# Patient Record
Sex: Female | Born: 1970 | Race: White | Hispanic: No | Marital: Single | Smoking: Former smoker
Health system: Southern US, Community
[De-identification: ages and names within clinical notes are randomized; demographics above are authoritative.]

## PROBLEM LIST (undated history)

## (undated) DIAGNOSIS — F329 Major depressive disorder, single episode, unspecified: Secondary | ICD-10-CM

## (undated) DIAGNOSIS — L309 Dermatitis, unspecified: Secondary | ICD-10-CM

## (undated) DIAGNOSIS — G43909 Migraine, unspecified, not intractable, without status migrainosus: Secondary | ICD-10-CM

## (undated) DIAGNOSIS — E669 Obesity, unspecified: Secondary | ICD-10-CM

## (undated) DIAGNOSIS — K219 Gastro-esophageal reflux disease without esophagitis: Secondary | ICD-10-CM

## (undated) DIAGNOSIS — Z923 Personal history of irradiation: Secondary | ICD-10-CM

## (undated) DIAGNOSIS — D0591 Unspecified type of carcinoma in situ of right breast: Secondary | ICD-10-CM

## (undated) DIAGNOSIS — F32A Depression, unspecified: Secondary | ICD-10-CM

## (undated) DIAGNOSIS — F4310 Post-traumatic stress disorder, unspecified: Secondary | ICD-10-CM

## (undated) DIAGNOSIS — K449 Diaphragmatic hernia without obstruction or gangrene: Secondary | ICD-10-CM

## (undated) DIAGNOSIS — K760 Fatty (change of) liver, not elsewhere classified: Secondary | ICD-10-CM

## (undated) DIAGNOSIS — R52 Pain, unspecified: Secondary | ICD-10-CM

## (undated) DIAGNOSIS — G4730 Sleep apnea, unspecified: Secondary | ICD-10-CM

## (undated) DIAGNOSIS — M549 Dorsalgia, unspecified: Secondary | ICD-10-CM

## (undated) DIAGNOSIS — M2550 Pain in unspecified joint: Secondary | ICD-10-CM

## (undated) DIAGNOSIS — F419 Anxiety disorder, unspecified: Secondary | ICD-10-CM

## (undated) DIAGNOSIS — E282 Polycystic ovarian syndrome: Secondary | ICD-10-CM

## (undated) DIAGNOSIS — R1011 Right upper quadrant pain: Secondary | ICD-10-CM

## (undated) DIAGNOSIS — J45909 Unspecified asthma, uncomplicated: Secondary | ICD-10-CM

## (undated) HISTORY — DX: Polycystic ovarian syndrome: E28.2

## (undated) HISTORY — PX: OTHER SURGICAL HISTORY: SHX169

## (undated) HISTORY — DX: Right upper quadrant pain: R10.11

## (undated) HISTORY — DX: Depression, unspecified: F32.A

## (undated) HISTORY — DX: Dorsalgia, unspecified: M54.9

## (undated) HISTORY — DX: Pain in unspecified joint: M25.50

## (undated) HISTORY — DX: Obesity, unspecified: E66.9

## (undated) HISTORY — DX: Unspecified asthma, uncomplicated: J45.909

## (undated) HISTORY — DX: Anxiety disorder, unspecified: F41.9

## (undated) HISTORY — DX: Post-traumatic stress disorder, unspecified: F43.10

## (undated) HISTORY — DX: Pain, unspecified: R52

## (undated) HISTORY — DX: Major depressive disorder, single episode, unspecified: F32.9

## (undated) HISTORY — DX: Gastro-esophageal reflux disease without esophagitis: K21.9

## (undated) HISTORY — DX: Fatty (change of) liver, not elsewhere classified: K76.0

## (undated) HISTORY — DX: Unspecified type of carcinoma in situ of right breast: D05.91

## (undated) HISTORY — DX: Dermatitis, unspecified: L30.9

## (undated) HISTORY — DX: Migraine, unspecified, not intractable, without status migrainosus: G43.909

---
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
## 1994-09-03 HISTORY — PX: TONSILLECTOMY: SUR1361

## 2011-11-09 ENCOUNTER — Encounter: Payer: Self-pay | Admitting: Internal Medicine

## 2011-11-09 ENCOUNTER — Ambulatory Visit (INDEPENDENT_AMBULATORY_CARE_PROVIDER_SITE_OTHER): Admitting: Internal Medicine

## 2011-11-09 VITALS — BP 106/74 | HR 80 | Temp 98.0°F | Ht 63.0 in | Wt 239.0 lb

## 2011-11-09 DIAGNOSIS — R05 Cough: Secondary | ICD-10-CM

## 2011-11-09 MED ORDER — ESOMEPRAZOLE MAGNESIUM 40 MG PO CPDR: 40.0000 mg | DELAYED_RELEASE_CAPSULE | Freq: Every day | ORAL | Status: DC

## 2011-11-09 MED ORDER — TRAMADOL HCL 50 MG PO TABS: ORAL_TABLET | ORAL | Status: AC

## 2011-11-09 MED ORDER — PREDNISONE (PAK) 10 MG PO TABS: ORAL_TABLET | ORAL | Status: AC

## 2011-11-09 NOTE — Patient Instructions (Addendum)
The key to effective treatment for your cough is eliminating the non-stop cycle of cough you're stuck in long enough to let your airway heal completely and then see if there is anything still making you cough once you stop the cough suppression, but this should take no more than 5 days to figuure out  First take delsym two tsp every 12 hours and supplement if needed with  tramadol 50 mg up to 2 every 4 hours to suppress the urge to cough. Swallowing water or using ice chips/non mint and menthol containing candies (such as lifesavers or sugarless jolly ranchers) are also effective.  You should rest your voice and avoid activities that you know make you cough.  Once you have eliminated the cough for 3 straight days try reducing the tramadol first,  then the delsym as tolerated.    Try prilosec 20mg  Take 30-60 min before first meal of the day and Pepcid 20 mg one bedtime until cough is completely gone for at least a week without the need for cough suppression  I think of reflux for chronic cough like I do oxygen for fire (doesn't cause the fire but once you get the oxygen suppressed it usually goes away regardless of the exact cause).  GERD (REFLUX)  is an extremely common cause of respiratory symptoms, many times with no significant heartburn at all.    It can be treated with medication, but also with lifestyle changes including avoidance of late meals, excessive alcohol, smoking cessation, and avoid fatty foods, chocolate, peppermint, colas, red wine, and acidic juices such as orange juice.  NO MINT OR MENTHOL PRODUCTS SO NO COUGH DROPS  USE SUGARLESS CANDY INSTEAD (jolley ranchers or Stover's)  NO OIL BASED VITAMINS - use powdered substitutes.    Prednisone 10 mg take  4 each am x 2 days,   2 each am x 2 days,  1 each am x2days and stop   Please schedule a follow up office visit in 2 weeks, sooner if needed   

## 2011-11-09 NOTE — Progress Notes (Signed)
  Subjective:    Patient ID: Erica Higgins, female    DOB: 1971-06-19  MRN: [id number]  HPI  52 yowf  never regular smoker  with lifelong sinus problems in spring / summer with no lower resp issues until moved back to [facility] into old house in August 2012 with onset Nov 2012 of cough and therefore referred 11/09/2011 by Dr Christell Constant to pulmonary clinic  11/09/2011 1st pulmonary eval/ Harman Langhans cc daily cough since insolent onset assoc with minimal mucus more day than night and some better p prednisone and some better p symbicort  2 bid and albuterol qid and used ppi some. Baby powder seems to make it worse. No overt hb or sinus complaints, and no assoc chest tighness wheeze or sob  Sleeping ok without nocturnal  or early am exacerbation  of respiratory  c/o's or need for noct saba. Also denies any obvious fluctuation of symptoms with weather or environmental changes or other aggravating or alleviating factors except as outlined above    Review of Systems  Constitutional: Positive for chills. Negative for fever and unexpected weight change.  HENT: Positive for dental problem. Negative for ear pain, nosebleeds, congestion, sore throat, rhinorrhea, sneezing, trouble swallowing, voice change, postnasal drip and sinus pressure.   Eyes: Negative for visual disturbance.  Respiratory: Positive for cough. Negative for choking and shortness of breath.   Cardiovascular: Positive for leg swelling. Negative for chest pain.  Gastrointestinal: Negative for vomiting, abdominal pain and diarrhea.  Genitourinary: Negative for difficulty urinating.  Musculoskeletal: Positive for arthralgias.  Skin: Negative for rash.  Neurological: Negative for tremors, syncope and headaches.  Hematological: Does not bruise/bleed easily.       Objective:   Physical Exam  amb wf nad with freq throat clearing  Wt 239 11/09/2011   HEENT: nl dentition, turbinates, and orophanx. Nl external ear canals without cough reflex   NECK :  without  JVD/Nodes/TM/ nl carotid upstrokes bilaterally   LUNGS: no acc muscle use, clear to A and P bilaterally without cough on insp or exp maneuvers   CV:  RRR  no s3 or murmur or increase in P2, no edema   ABD:  soft and nontender with nl excursion in the supine position. No bruits or organomegaly, bowel sounds nl  MS:  warm without deformities, calf tenderness, cyanosis or clubbing  SKIN: warm and dry without lesions    NEURO:  alert, approp, no deficits    09/16/11 cxr report from Dr Kathi Der office Low lung vol, o/w ok    Assessment & Plan:

## 2011-11-18 DIAGNOSIS — R05 Cough: Secondary | ICD-10-CM | POA: Insufficient documentation

## 2011-11-18 DIAGNOSIS — R053 Chronic cough: Secondary | ICD-10-CM | POA: Insufficient documentation

## 2011-11-18 NOTE — Assessment & Plan Note (Signed)
The most common causes of chronic cough in immunocompetent adults include the following: upper airway cough syndrome (UACS), previously referred to as postnasal drip syndrome (PNDS), which is caused by variety of rhinosinus conditions; (2) asthma; (3) GERD; (4) chronic bronchitis from cigarette smoking or other inhaled environmental irritants; (5) nonasthmatic eosinophilic bronchitis; and (6) bronchiectasis.   These conditions, singly or in combination, have accounted for up to 94% of the causes of chronic cough in prospective studies.   Other conditions have constituted no >6% of the causes in prospective studies These have included bronchogenic carcinoma, chronic interstitial pneumonia, sarcoidosis, left ventricular failure, ACEI-induced cough, and aspiration from a condition associated with pharyngeal dysfunction.   .Chronic cough is often simultaneously caused by more than one condition. A single cause has been found from 38 to 82% of the time, multiple causes from 18 to 62%. Multiply caused cough has been the result of three diseases up to 42% of the time.    Of the three most common causes of chronic cough, only one (GERD)  can actually cause the other two (asthma and post nasal drip syndrome)  and perpetuate the cylce of cough inducing airway trauma, inflammation, heightened sensitivity to reflux which is prompted by the cough itself via a cyclical mechanism.    This may partially respond to steroids and look like asthma and post nasal drainage but never erradicated completely unless the cough and the secondary reflux are eliminated, preferably both at the same time.  While not intuitively obvious, many patients with chronic low grade reflux do not cough until there is a secondary insult that disturbs the protective epithelial barrier and exposes sensitive nerve endings.  This can be viral or direct physical injury such as with an endotracheal tube.   The point is that once this occurs, it is  difficult to eliminate using anything but a maximally effective acid suppression regimen at least in the short run, accompanied by an appropriate diet to address non acid GERD.   Rec eliminate cyclical coughing then regroup in 2 weeks

## 2011-11-26 ENCOUNTER — Encounter: Payer: Self-pay | Admitting: Internal Medicine

## 2011-11-26 ENCOUNTER — Ambulatory Visit (INDEPENDENT_AMBULATORY_CARE_PROVIDER_SITE_OTHER): Admitting: Internal Medicine

## 2011-11-26 VITALS — BP 110/72 | HR 82 | Temp 98.2°F | Ht 63.0 in | Wt 240.0 lb

## 2011-11-26 DIAGNOSIS — R05 Cough: Secondary | ICD-10-CM

## 2011-11-26 DIAGNOSIS — J309 Allergic rhinitis, unspecified: Secondary | ICD-10-CM

## 2011-11-26 MED ORDER — FAMOTIDINE 20 MG PO TABS: ORAL_TABLET | ORAL | Status: AC

## 2011-11-26 MED ORDER — RABEPRAZOLE SODIUM 20 MG PO TBEC: 20.0000 mg | DELAYED_RELEASE_TABLET | Freq: Every day | ORAL | Status: AC

## 2011-11-26 NOTE — Progress Notes (Signed)
  Subjective:    Patient ID: Erica Higgins, female    DOB: 01-Jun-1971  MRN: [id number]  HPI  36 yowf  never regular smoker  with lifelong sinus problems in spring / summer with no lower resp issues until moved back to [facility] into old house in August 2012 with onset Nov 2012 of cough and therefore referred 11/09/2011 by Dr Christell Constant to pulmonary clinic  11/09/2011 1st pulmonary eval/ Aymen Widrig cc daily cough since insolent onset assoc with minimal mucus more day than night and some better p prednisone and some better p symbicort  2 bid and albuterol qid and used ppi some. Baby powder seems to make it worse. No overt hb or sinus complaints, and no assoc chest tighness wheeze or sob rec Cyclical cough regimen with prednisone x 6 days  11/26/2011 f/u ov/Zigmond Trela cc cough much better but problems tolerating nexium due to sensation of bloating and nausea. No daytime saba need. No purulent sputum or overt sinus or reflux symptoms. Minimal itching/ sneezing watery eyes and mucoid nasal discharge previously controlled with allegra or clariton  Sleeping ok without nocturnal  or early am exacerbation  of respiratory  c/o's or need for noct saba. Also denies any obvious fluctuation of symptoms with weather or environmental changes or other aggravating or alleviating factors except as outlined above.   ROS  At present neg for  any significant sore throat, dysphagia, nasal congestion or excess/ purulent secretions,  fever, chills, sweats, unintended wt loss, pleuritic or exertional cp, hempoptysis, orthopnea pnd or leg swelling.  Also denies presyncope, palpitations, heartburn, abdominal pain, nausea, vomiting, diarrhea  or change in bowel or urinary habits, dysuria,hematuria,  rash, arthralgias, visual complaints, headache, numbness weakness or ataxia.           Objective:   Physical Exam  amb wf nad    Wt 239 11/09/2011 > 240 11/26/2011   HEENT: nl dentition, turbinates, and orophanx. Nl external ear canals without cough  reflex   NECK :  without JVD/Nodes/TM/ nl carotid upstrokes bilaterally   LUNGS: no acc muscle use, clear to A and P bilaterally without cough on insp or exp maneuvers   CV:  RRR  no s3 or murmur or increase in P2, no edema   ABD:  soft and nontender with nl excursion in the supine position. No bruits or organomegaly, bowel sounds nl  MS:  warm without deformities, calf tenderness, cyanosis or clubbing       09/16/11 cxr report from Dr Kathi Der office Low lung vol, o/w ok    Assessment & Plan:

## 2011-11-26 NOTE — Patient Instructions (Signed)
Ok to take Careers adviser / clariton as needed   Aciphex 20mg   Take 30-60 min before first meal of the day and Pepcid 20 mg at bedtime until next visit  Please schedule a follow up office visit in 6 weeks, call sooner if needed

## 2011-11-27 DIAGNOSIS — J309 Allergic rhinitis, unspecified: Secondary | ICD-10-CM | POA: Insufficient documentation

## 2011-11-27 NOTE — Assessment & Plan Note (Addendum)
This is almost certainly  Classic Upper airway cough syndrome, so named because it's frequently impossible to sort out how much is  CR/sinusitis with freq throat clearing (which can be related to primary GERD)   vs  causing  secondary (" extra esophageal")  GERD from wide swings in gastric pressure that occur with throat clearing, often  promoting self use of mint and menthol lozenges that reduce the lower esophageal sphincter tone and exacerbate the problem further in a cyclical fashion.   These are the same pts (now being labeled as having "irritable larynx syndrome" by some cough centers) who not infrequently have a history of having failed to tolerate ace inhibitors,  dry powder inhalers or biphosphonates or report having atypical reflux symptoms that don't respond to standard doses of PPI , and are easily confused as having aecopd or asthma flares by even experienced allergists/ pulmonologists.   Based on initial response, needs to maintain aggressive rx x 6 weeks to see if flares again on gerd rx, in which case consider adding qvar for cough variant asthma as another concern  NB Chronic cough is often simultaneously caused by more than one condition. A single cause has been found from 38 to 82% of the time, multiple causes from 18 to 62%. Multiply caused cough has been the result of three diseases up to 42% of the time.

## 2011-11-27 NOTE — Assessment & Plan Note (Signed)
Ok to use Careers adviser or clariton as needed with zyrtec otc a third over the counter alternative

## 2011-12-04 ENCOUNTER — Institutional Professional Consult (permissible substitution): Payer: Self-pay | Admitting: Internal Medicine

## 2012-01-08 ENCOUNTER — Ambulatory Visit (INDEPENDENT_AMBULATORY_CARE_PROVIDER_SITE_OTHER): Admitting: Internal Medicine

## 2012-01-08 ENCOUNTER — Encounter: Payer: Self-pay | Admitting: Internal Medicine

## 2012-01-08 VITALS — BP 118/80 | HR 69 | Temp 97.6°F | Ht 63.0 in | Wt 234.0 lb

## 2012-01-08 DIAGNOSIS — J309 Allergic rhinitis, unspecified: Secondary | ICD-10-CM

## 2012-01-08 DIAGNOSIS — R05 Cough: Secondary | ICD-10-CM

## 2012-01-08 NOTE — Assessment & Plan Note (Signed)
Response to ppi/h2 hs strongly supports dx of  Upper airway cough syndrome, so named because it's frequently impossible to sort out how much is  CR/sinusitis with freq throat clearing (which can be related to primary GERD)   vs  causing  secondary (" extra esophageal")  GERD from wide swings in gastric pressure that occur with throat clearing, often  promoting self use of mint and menthol lozenges that reduce the lower esophageal sphincter tone and exacerbate the problem further in a cyclical fashion.   These are the same pts (now being labeled as having "irritable larynx syndrome" by some cough centers) who not infrequently have a history of having failed to tolerate ace inhibitors,  dry powder inhalers or biphosphonates or report having atypical reflux symptoms that don't respond to standard doses of PPI , and are easily confused as having aecopd or asthma flares by even experienced allergists/ pulmonologists.   No changes needed for now, no need for maint rx for asthma as long as albuterol use so minimum.

## 2012-01-08 NOTE — Assessment & Plan Note (Signed)
Not responding to allegra or clarition so try zyrtec and NS irrigation with option to start Nasal steroids later if not responding.  Could also entertain trial of singulair if needed but at this point no further pulmonary f/u needed.

## 2012-01-08 NOTE — Progress Notes (Signed)
  Subjective:    Patient ID: Erica Higgins, female    DOB: 08/23/1971  MRN: [id number]  HPI  5 yowf  never regular smoker  with lifelong sinus problems in spring / summer with no lower resp issues until moved back to [facility] into old house in August 2012 with onset Nov 2012 of cough and therefore referred 11/09/2011 by Dr Christell Constant to pulmonary clinic  11/09/2011 1st pulmonary eval/ Aniceto Kyser cc daily cough since insolent onset assoc with minimal mucus more day than night and some better p prednisone and some better p symbicort  2 bid and albuterol qid and used ppi some. Baby powder seems to make it worse. No overt hb or sinus complaints, and no assoc chest tighness wheeze or sob rec Cyclical cough regimen with prednisone x 6 days  11/26/2011 f/u ov/Latrecia Capito cc cough much better but problems tolerating nexium due to sensation of bloating and nausea. No daytime saba need. No purulent sputum or overt sinus or reflux symptoms. Minimal itching/ sneezing watery eyes and mucoid nasal discharge previously controlled with allegra or clariton rec Ok to take allegra / clariton as needed  Aciphex 20mg   Take 30-60 min before first meal of the day and Pepcid 20 mg at bedtime until next visit   01/08/2012 f/u ov/Virat Prather cc 90% better, rare need for albuterol, clariton not helping as much with sense of throat  tickle / nasal congestion, but not affecting breathing, no purulent secretions. Not limited from desired activities  Sleeping ok without nocturnal  or early am exacerbation  of respiratory  c/o's or need for noct saba. Also denies any obvious fluctuation of symptoms with weather or environmental changes or other aggravating or alleviating factors except as outlined above.   ROS  At present neg for  any significant sore throat, dysphagia, nasal congestion or excess/ purulent secretions,  fever, chills, sweats, unintended wt loss, pleuritic or exertional cp, hempoptysis, orthopnea pnd or leg swelling.  Also denies presyncope,  palpitations, heartburn, abdominal pain, nausea, vomiting, diarrhea  or change in bowel or urinary habits, dysuria,hematuria,  rash, arthralgias, visual complaints, headache, numbness weakness or ataxia.           Objective:   Physical Exam  amb mod obese  wf nad    Wt 239 11/09/2011 > 240 11/26/2011 > 01/08/2012  234  HEENT: nl dentition,and orophanx. Mild non specific turnbinate edema.  Nl external ear canals without cough reflex   NECK :  without JVD/Nodes/TM/ nl carotid upstrokes bilaterally   LUNGS: no acc muscle use, clear to A and P bilaterally without cough on insp or exp maneuvers   CV:  RRR  no s3 or murmur or increase in P2, no edema   ABD:  soft and nontender with nl excursion in the supine position. No bruits or organomegaly, bowel sounds nl  MS:  warm without deformities, calf tenderness, cyanosis or clubbing       09/16/11 cxr report from Dr Kathi Der office Low lung vol, o/w ok    Assessment & Plan:

## 2012-01-08 NOTE — Patient Instructions (Signed)
Try zyrtec as needed for nasal symptoms take at bedtime since may cause drowsiness  If nasal symptoms not better ok to try  Fluticasone instead of saline  For nasal or chest congestion use mucinex dm    If you are satisfied with your treatment plan let your doctor know and he/she can either refill your medications or you can return here when your prescription runs out.     If in any way you are not 100% satisfied,  please tell us.  If 100% better, tell your friends!

## 2012-07-15 ENCOUNTER — Ambulatory Visit: Attending: Family Medicine | Admitting: Physical Therapy

## 2012-07-15 DIAGNOSIS — IMO0001 Reserved for inherently not codable concepts without codable children: Secondary | ICD-10-CM | POA: Insufficient documentation

## 2012-07-15 DIAGNOSIS — M542 Cervicalgia: Secondary | ICD-10-CM | POA: Insufficient documentation

## 2012-07-18 ENCOUNTER — Ambulatory Visit: Admitting: Physical Therapy

## 2012-07-21 ENCOUNTER — Ambulatory Visit: Admitting: Physical Therapy

## 2012-07-23 ENCOUNTER — Ambulatory Visit: Admitting: Physical Therapy

## 2012-07-28 ENCOUNTER — Ambulatory Visit: Admitting: Physical Therapy

## 2012-08-04 ENCOUNTER — Ambulatory Visit: Attending: Family Medicine | Admitting: Physical Therapy

## 2012-08-04 DIAGNOSIS — IMO0001 Reserved for inherently not codable concepts without codable children: Secondary | ICD-10-CM | POA: Insufficient documentation

## 2012-08-04 DIAGNOSIS — M542 Cervicalgia: Secondary | ICD-10-CM | POA: Insufficient documentation

## 2012-08-08 ENCOUNTER — Encounter: Admitting: Physical Therapy

## 2012-08-11 ENCOUNTER — Ambulatory Visit: Admitting: Physical Therapy

## 2012-12-04 ENCOUNTER — Telehealth: Payer: Self-pay | Admitting: Nurse Practitioner

## 2012-12-04 NOTE — Telephone Encounter (Signed)
APPT MOVED UP

## 2012-12-05 ENCOUNTER — Ambulatory Visit (INDEPENDENT_AMBULATORY_CARE_PROVIDER_SITE_OTHER): Admitting: General Practice

## 2012-12-05 ENCOUNTER — Ambulatory Visit (INDEPENDENT_AMBULATORY_CARE_PROVIDER_SITE_OTHER)

## 2012-12-05 ENCOUNTER — Encounter: Payer: Self-pay | Admitting: General Practice

## 2012-12-05 VITALS — BP 100/70 | HR 69 | Temp 99.2°F | Ht 63.0 in | Wt 237.0 lb

## 2012-12-05 DIAGNOSIS — M25569 Pain in unspecified knee: Secondary | ICD-10-CM

## 2012-12-05 DIAGNOSIS — M25562 Pain in left knee: Secondary | ICD-10-CM

## 2012-12-05 NOTE — Progress Notes (Signed)
  Subjective:    Patient ID: Erica Higgins, female    DOB: Jan 31, 1971, 42 y.o.   MRN: [id number]  Knee Pain  The incident occurred more than 1 week ago (fell and hurt knee in 2003, had two surgeries (2003, 2005). Recently had to do physical fitness test for job and on step test hurt knee.). The incident occurred at work (required by work). The injury mechanism is unknown. The pain is present in the left knee and left leg. The quality of the pain is described as aching, cramping and burning (left calf area  after any motion/movement of left knee). The pain is at a severity of 6/10. The pain is moderate. The pain has been fluctuating since onset. Associated symptoms comments: Reports that knee locks up at times (becomes stiff and has force to bend). She reports no foreign bodies present. The symptoms are aggravated by movement and weight bearing. She has tried NSAIDs for the symptoms. The treatment provided no relief.      Review of Systems  Constitutional: Negative for fever and chills.  Respiratory: Negative for chest tightness and shortness of breath.   Cardiovascular: Negative for chest pain and palpitations.  Genitourinary: Negative for difficulty urinating.  Musculoskeletal: Positive for myalgias and back pain.       Left knee calf muscles  Skin: Negative.   Neurological: Negative for dizziness and headaches.  Psychiatric/Behavioral: Negative.        Objective:   Physical Exam  Constitutional: She is oriented to person, place, and time. She appears well-developed and well-nourished.  Cardiovascular: Normal rate, regular rhythm and normal heart sounds.   No murmur heard. Pulmonary/Chest: Effort normal.  Diminished breath sounds throughout  Musculoskeletal: She exhibits edema.  Crepitus felt at left knee with flexion and extension  Neurological: She is alert and oriented to person, place, and time.  Skin: Skin is warm and dry.  Psychiatric: She has a normal mood and affect.   [facility]  reading (PRIMARY) by Ruthell Rummage, FNP-C, medial side joint space narrowing, no fracture or dislocation noted.                                   Assessment & Plan:  Referral made to orthopedic specilalist Continue current as directed Rest and ice left knee RTO if symptoms worsen Patient verbalized understanding and denies any further questions  Raymon Mutton, FNP-C

## 2012-12-05 NOTE — Patient Instructions (Addendum)
Knee Pain The knee is the complex joint between your thigh and your lower leg. It is made up of bones, tendons, ligaments, and cartilage. The bones that make up the knee are:  The femur in the thigh.  The tibia and fibula in the lower leg.  The patella or kneecap riding in the groove on the lower femur. CAUSES  Knee pain is a common complaint with many causes. A few of these causes are:  Injury, such as:  A ruptured ligament or tendon injury.  Torn cartilage.  Medical conditions, such as:  Gout  Arthritis  Infections  Overuse, over training or overdoing a physical activity. Knee pain can be minor or severe. Knee pain can accompany debilitating injury. Minor knee problems often respond well to self-care measures or get well on their own. More serious injuries may need medical intervention or even surgery. SYMPTOMS The knee is complex. Symptoms of knee problems can vary widely. Some of the problems are:  Pain with movement and weight bearing.  Swelling and tenderness.  Buckling of the knee.  Inability to straighten or extend your knee.  Your knee locks and you cannot straighten it.  Warmth and redness with pain and fever.  Deformity or dislocation of the kneecap. DIAGNOSIS  Determining what is wrong may be very straight forward such as when there is an injury. It can also be challenging because of the complexity of the knee. Tests to make a diagnosis may include:  Your caregiver taking a history and doing a physical exam.  Routine X-rays can be used to rule out other problems. X-rays will not reveal a cartilage tear. Some injuries of the knee can be diagnosed by:  Arthroscopy a surgical technique by which a small video camera is inserted through tiny incisions on the sides of the knee. This procedure is used to examine and repair internal knee joint problems. Tiny instruments can be used during arthroscopy to repair the torn knee cartilage (meniscus).  Arthrography  is a radiology technique. A contrast liquid is directly injected into the knee joint. Internal structures of the knee joint then become visible on X-ray film.  An MRI scan is a non x-ray radiology procedure in which magnetic fields and a computer produce two- or three-dimensional images of the inside of the knee. Cartilage tears are often visible using an MRI scanner. MRI scans have largely replaced arthrography in diagnosing cartilage tears of the knee.  Blood work.  Examination of the fluid that helps to lubricate the knee joint (synovial fluid). This is done by taking a sample out using a needle and a syringe. TREATMENT The treatment of knee problems depends on the cause. Some of these treatments are:  Depending on the injury, proper casting, splinting, surgery or physical therapy care will be needed.  Give yourself adequate recovery time. Do not overuse your joints. If you begin to get sore during workout routines, back off. Slow down or do fewer repetitions.  For repetitive activities such as cycling or running, maintain your strength and nutrition.  Alternate muscle groups. For example if you are a weight lifter, work the upper body on one day and the lower body the next.  Either tight or weak muscles do not give the proper support for your knee. Tight or weak muscles do not absorb the stress placed on the knee joint. Keep the muscles surrounding the knee strong.  Take care of mechanical problems.  If you have flat feet, orthotics or special shoes may help.   See your caregiver if you need help.  Arch supports, sometimes with wedges on the inner or outer aspect of the heel, can help. These can shift pressure away from the side of the knee most bothered by osteoarthritis.  A brace called an "unloader" brace also may be used to help ease the pressure on the most arthritic side of the knee.  If your caregiver has prescribed crutches, braces, wraps or ice, use as directed. The acronym for  this is PRICE. This means protection, rest, ice, compression and elevation.  Nonsteroidal anti-inflammatory drugs (NSAID's), can help relieve pain. But if taken immediately after an injury, they may actually increase swelling. Take NSAID's with food in your stomach. Stop them if you develop stomach problems. Do not take these if you have a history of ulcers, stomach pain or bleeding from the bowel. Do not take without your caregiver's approval if you have problems with fluid retention, heart failure, or kidney problems.  For ongoing knee problems, physical therapy may be helpful.  Glucosamine and chondroitin are over-the-counter dietary supplements. Both may help relieve the pain of osteoarthritis in the knee. These medicines are different from the usual anti-inflammatory drugs. Glucosamine may decrease the rate of cartilage destruction.  Injections of a corticosteroid drug into your knee joint may help reduce the symptoms of an arthritis flare-up. They may provide pain relief that lasts a few months. You may have to wait a few months between injections. The injections do have a small increased risk of infection, water retention and elevated blood sugar levels.  Hyaluronic acid injected into damaged joints may ease pain and provide lubrication. These injections may work by reducing inflammation. A series of shots may give relief for as long as 6 months.  Topical painkillers. Applying certain ointments to your skin may help relieve the pain and stiffness of osteoarthritis. Ask your pharmacist for suggestions. Many over the-counter products are approved for temporary relief of arthritis pain.  In some countries, doctors often prescribe topical NSAID's for relief of chronic conditions such as arthritis and tendinitis. A review of treatment with NSAID creams found that they worked as well as oral medications but without the serious side effects. PREVENTION  Maintain a healthy weight. Extra pounds put  more strain on your joints.  Get strong, stay limber. Weak muscles are a common cause of knee injuries. Stretching is important. Include flexibility exercises in your workouts.  Be smart about exercise. If you have osteoarthritis, chronic knee pain or recurring injuries, you may need to change the way you exercise. This does not mean you have to stop being active. If your knees ache after jogging or playing basketball, consider switching to swimming, water aerobics or other low-impact activities, at least for a few days a week. Sometimes limiting high-impact activities will provide relief.  Make sure your shoes fit well. Choose footwear that is right for your sport.  Protect your knees. Use the proper gear for knee-sensitive activities. Use kneepads when playing volleyball or laying carpet. Buckle your seat belt every time you drive. Most shattered kneecaps occur in car accidents.  Rest when you are tired. SEEK MEDICAL CARE IF:  You have knee pain that is continual and does not seem to be getting better.  SEEK IMMEDIATE MEDICAL CARE IF:  Your knee joint feels hot to the touch and you have a high fever. MAKE SURE YOU:   Understand these instructions.  Will watch your condition.  Will get help right away if you are not   doing well or get worse. Document Released: 06/17/2007 Document Revised: 11/12/2011 Document Reviewed: 06/17/2007 ExitCare Patient Information 2013 [employer].  

## 2012-12-24 ENCOUNTER — Ambulatory Visit: Payer: Self-pay | Admitting: Nurse Practitioner

## 2013-01-02 ENCOUNTER — Other Ambulatory Visit: Payer: Self-pay | Admitting: Nurse Practitioner

## 2013-01-02 DIAGNOSIS — R928 Other abnormal and inconclusive findings on diagnostic imaging of breast: Secondary | ICD-10-CM

## 2013-02-26 ENCOUNTER — Telehealth: Payer: Self-pay | Admitting: Nurse Practitioner

## 2013-02-26 ENCOUNTER — Ambulatory Visit (INDEPENDENT_AMBULATORY_CARE_PROVIDER_SITE_OTHER): Admitting: Nurse Practitioner

## 2013-02-26 ENCOUNTER — Encounter: Payer: Self-pay | Admitting: Nurse Practitioner

## 2013-02-26 VITALS — BP 138/83 | HR 88 | Temp 97.4°F | Ht 63.0 in | Wt 244.0 lb

## 2013-02-26 DIAGNOSIS — J029 Acute pharyngitis, unspecified: Secondary | ICD-10-CM

## 2013-02-26 MED ORDER — IBUPROFEN 800 MG PO TABS: 800.0000 mg | ORAL_TABLET | Freq: Three times a day (TID) | ORAL | Status: DC | PRN

## 2013-02-26 NOTE — Patient Instructions (Signed)
Strep Throat  Strep throat is an infection of the throat caused by a bacteria named Streptococcus pyogenes. Your caregiver may call the infection streptococcal "tonsillitis" or "pharyngitis" depending on whether there are signs of inflammation in the tonsils or back of the throat. Strep throat is most common in children aged 40 15 years during the cold months of the year, but it can occur in people of any age during any season. This infection is spread from person to person (contagious) through coughing, sneezing, or other close contact.  SYMPTOMS   · Fever or chills.  · Painful, swollen, red tonsils or throat.  · Pain or difficulty when swallowing.  · White or yellow spots on the tonsils or throat.  · Swollen, tender lymph nodes or "glands" of the neck or under the jaw.  · Red rash all over the body (rare).  DIAGNOSIS   Many different infections can cause the same symptoms. A test must be done to confirm the diagnosis so the right treatment can be given. A "rapid strep test" can help your caregiver make the diagnosis in a few minutes. If this test is not available, a light swab of the infected area can be used for a throat culture test. If a throat culture test is done, results are usually available in a day or two.  TREATMENT   Strep throat is treated with antibiotic medicine.  HOME CARE INSTRUCTIONS   · Gargle with 1 tsp of salt in 1 cup of warm water, 3 4 times per day or as needed for comfort.  · Family members who also have a sore throat or fever should be tested for strep throat and treated with antibiotics if they have the strep infection.  · Make sure everyone in your household washes their hands well.  · Do not share food, drinking cups, or personal items that could cause the infection to spread to others.  · You may need to eat a soft food diet until your sore throat gets better.  · Drink enough water and fluids to keep your urine clear or pale yellow. This will help prevent dehydration.  · Get plenty of  rest.  · Stay home from school, daycare, or work until you have been on antibiotics for 24 hours.  · Only take over-the-counter or prescription medicines for pain, discomfort, or fever as directed by your caregiver.  · If antibiotics are prescribed, take them as directed. Finish them even if you start to feel better.  SEEK MEDICAL CARE IF:   · The glands in your neck continue to enlarge.  · You develop a rash, cough, or earache.  · You cough up green, yellow-brown, or bloody sputum.  · You have pain or discomfort not controlled by medicines.  · Your problems seem to be getting worse rather than better.  SEEK IMMEDIATE MEDICAL CARE IF:   · You develop any new symptoms such as vomiting, severe headache, stiff or painful neck, chest pain, shortness of breath, or trouble swallowing.  · You develop severe throat pain, drooling, or changes in your voice.  · You develop swelling of the neck, or the skin on the neck becomes red and tender.  · You have a fever.  · You develop signs of dehydration, such as fatigue, dry mouth, and decreased urination.  · You become increasingly sleepy, or you cannot wake up completely.  Document Released: 08/17/2000 Document Revised: 08/06/2012 Document Reviewed: 10/19/2010  ExitCare® Patient Information ©2014 [employer].

## 2013-02-26 NOTE — Progress Notes (Signed)
  Subjective:    Patient ID: Erica Higgins, female    DOB: Jan 24, 1971, 42 y.o.   MRN: [id number]  Sore Throat  This is a new problem. The current episode started yesterday. The problem has been rapidly worsening. The pain is worse on the left side. There has been no fever. The pain is at a severity of 3/10. The pain is mild. Associated symptoms include congestion, a hoarse voice and trouble swallowing. She has tried NSAIDs for the symptoms. The treatment provided no relief.      Review of Systems  HENT: Positive for congestion, sore throat, hoarse voice and trouble swallowing.        Objective:   Physical Exam  Constitutional: She is oriented to person, place, and time. She appears well-developed and well-nourished.  Neck: Normal range of motion. Neck supple. No thyromegaly present.  Redness present on back of throat   Cardiovascular: Normal rate, regular rhythm, normal heart sounds and intact distal pulses.   Pulmonary/Chest: Effort normal and breath sounds normal.  Abdominal: Soft. Bowel sounds are normal.  Musculoskeletal: Normal range of motion.  Neurological: She is alert and oriented to person, place, and time.  Skin: Skin is warm and dry.  Psychiatric: She has a normal mood and affect. Her behavior is normal. Judgment and thought content normal.    BP 138/83  Pulse 88  Temp(Src) 97.4 F (36.3 C) (Oral)  Ht 5\' 3"  (1.6 m)  Wt 244 lb (110.678 kg)  BMI 43.23 kg/m2       Assessment & Plan:  1. Sore throat  - POCT rapid strep A  2. Viral pharyngitis 1. Take meds as prescribed 2. Use a cool mist humidifier especially during the winter months and when heat has  been humid. 3. Use saline nose sprays frequently 4. Saline irrigations of the nose can be very helpful if done frequently.  * 4X daily for 1 week*  * Use of a nettie pot can be helpful with this. Follow directions with this* 5. Drink plenty of fluids 6. Keep thermostat turn down low 7.For any cough or  congestion  Use plain Mucinex- regular strength or max strength is fine   * Children- consult with Pharmacist for dosing 8. For fever or aces or pains- take tylenol or ibuprofen appropriate for age and weight.  * for fevers greater than 101 orally you may alternate ibuprofen and tylenol every  3 hours.   Meds ordered this encounter  Medications  . ibuprofen (ADVIL,MOTRIN) 800 MG tablet    Sig: Take 1 tablet (800 mg total) by mouth every 8 (eight) hours as needed for pain.    Dispense:  40 tablet    Refill:  0    Order Specific Question:  Supervising Provider    Answer:  Ernestina Penna [[id number]]   RTO prn  Mary-Margaret Daphine Deutscher, FNP

## 2013-02-26 NOTE — Telephone Encounter (Signed)
Spoke with patient and appt scheduled for this afternoon.

## 2013-02-27 ENCOUNTER — Telehealth: Payer: Self-pay | Admitting: Nurse Practitioner

## 2013-02-27 NOTE — Telephone Encounter (Signed)
DONE

## 2013-02-27 NOTE — Telephone Encounter (Signed)
APPT CHANGED.

## 2013-05-07 ENCOUNTER — Encounter: Payer: Self-pay | Admitting: General Practice

## 2013-05-07 ENCOUNTER — Ambulatory Visit (INDEPENDENT_AMBULATORY_CARE_PROVIDER_SITE_OTHER): Admitting: General Practice

## 2013-05-07 VITALS — BP 105/65 | HR 78 | Temp 98.4°F | Ht 63.0 in | Wt 247.0 lb

## 2013-05-07 DIAGNOSIS — L299 Pruritus, unspecified: Secondary | ICD-10-CM

## 2013-05-07 DIAGNOSIS — B373 Candidiasis of vulva and vagina: Secondary | ICD-10-CM

## 2013-05-07 LAB — POCT WET PREP (WET MOUNT)

## 2013-05-07 NOTE — Patient Instructions (Signed)
Candida Infection, Adult A candida infection (also called yeast, fungus and Monilia infection) is an overgrowth of yeast that can occur anywhere on the body. A yeast infection commonly occurs in warm, moist body areas. Usually, the infection remains localized but can spread to become a systemic infection. A yeast infection may be a sign of a more severe disease such as diabetes, leukemia, or AIDS. A yeast infection can occur in both men and women. In women, Candida vaginitis is a vaginal infection. It is one of the most common causes of vaginitis. Men usually do not have symptoms or know they have an infection until other problems develop. Men may find out they have a yeast infection because their sex partner has a yeast infection. Uncircumcised men are more likely to get a yeast infection than circumcised men. This is because the uncircumcised glans is not exposed to air and does not remain as dry as that of a circumcised glans. Older adults may develop yeast infections around dentures. CAUSES  Women  Antibiotics.  Steroid medication taken for a long time.  Being overweight (obese).  Diabetes.  Poor immune condition.  Certain serious medical conditions.  Immune suppressive medications for organ transplant patients.  Chemotherapy.  Pregnancy.  Menstration.  Stress and fatigue.  Intravenous drug use.  Oral contraceptives.  Wearing tight-fitting clothes in the crotch area.  Catching it from a sex partner who has a yeast infection.  Spermicide.  Intravenous, urinary, or other catheters. Men  Catching it from a sex partner who has a yeast infection.  Having oral or anal sex with a person who has the infection.  Spermicide.  Diabetes.  Antibiotics.  Poor immune system.  Medications that suppress the immune system.  Intravenous drug use.  Intravenous, urinary, or other catheters. SYMPTOMS  Women  Thick, white vaginal discharge.  Vaginal itching.  Redness and  swelling in and around the vagina.  Irritation of the lips of the vagina and perineum.  Blisters on the vaginal lips and perineum.  Painful sexual intercourse.  Low blood sugar (hypoglycemia).  Painful urination.  Bladder infections.  Intestinal problems such as constipation, indigestion, bad breath, bloating, increase in gas, diarrhea, or loose stools. Men  Men may develop intestinal problems such as constipation, indigestion, bad breath, bloating, increase in gas, diarrhea, or loose stools.  Dry, cracked skin on the penis with itching or discomfort.  Jock itch.  Dry, flaky skin.  Athlete's foot.  Hypoglycemia. DIAGNOSIS  Women  A history and an exam are performed.  The discharge may be examined under a microscope.  A culture may be taken of the discharge. Men  A history and an exam are performed.  Any discharge from the penis or areas of cracked skin will be looked at under the microscope and cultured.  Stool samples may be cultured. TREATMENT  Women  Vaginal antifungal suppositories and creams.  Medicated creams to decrease irritation and itching on the outside of the vagina.  Warm compresses to the perineal area to decrease swelling and discomfort.  Oral antifungal medications.  Medicated vaginal suppositories or cream for repeated or recurrent infections.  Wash and dry the irritation areas before applying the cream.  Eating yogurt with lactobacillus may help with prevention and treatment.  Sometimes painting the vagina with gentian violet solution may help if creams and suppositories do not work. Men  Antifungal creams and oral antifungal medications.  Sometimes treatment must continue for 30 days after the symptoms go away to prevent recurrence. HOME CARE   INSTRUCTIONS  Women  Use cotton underwear and avoid tight-fitting clothing.  Avoid colored, scented toilet paper and deodorant tampons or pads.  Do not douche.  Keep your diabetes  under control.  Finish all the prescribed medications.  Keep your skin clean and dry.  Consume milk or yogurt with lactobacillus active culture regularly. If you get frequent yeast infections and think that is what the infection is, there are over-the-counter medications that you can get. If the infection does not show healing in 3 days, talk to your caregiver.  Tell your sex partner you have a yeast infection. Your partner may need treatment also, especially if your infection does not clear up or recurs. Men  Keep your skin clean and dry.  Keep your diabetes under control.  Finish all prescribed medications.  Tell your sex partner that you have a yeast infection so they can be treated if necessary. SEEK MEDICAL CARE IF:   Your symptoms do not clear up or worsen in one week after treatment.  You have an oral temperature above 102 F (38.9 C).  You have trouble swallowing or eating for a prolonged time.  You develop blisters on and around your vagina.  You develop vaginal bleeding and it is not your menstrual period.  You develop abdominal pain.  You develop intestinal problems as mentioned above.  You get weak or lightheaded.  You have painful or increased urination.  You have pain during sexual intercourse. MAKE SURE YOU:   Understand these instructions.  Will watch your condition.  Will get help right away if you are not doing well or get worse. Document Released: 09/27/2004 Document Revised: 11/12/2011 Document Reviewed: 01/09/2010 ExitCare Patient Information 2014 [employer].  

## 2013-05-07 NOTE — Progress Notes (Signed)
  Subjective:    Patient ID: Erica Higgins, female    DOB: 1971/02/26, 42 y.o.   MRN: [id number]  Vaginal Itching The patient's primary symptoms include a vaginal discharge. The patient's pertinent negatives include no genital lesions, genital odor, genital rash, pelvic pain or vaginal bleeding. This is a new problem. The current episode started in the past 7 days. The problem occurs constantly. The problem has been gradually worsening. The pain is mild. The problem affects both sides. She is not pregnant. Pertinent negatives include no abdominal pain, chills, discolored urine, dysuria, fever, frequency, hematuria, nausea or urgency. The vaginal discharge was thin and clear. The symptoms are aggravated by urinating. She has tried nothing for the symptoms. She is not sexually active. No, her partner does not have an STD. She uses abstinence for contraception. Menstrual history: no menstrual period since [address]84. Her past medical history is significant for a Cesarean section. There is no history of miscarriage, PID or an STD.      Review of Systems  Constitutional: Negative for fever and chills.  Gastrointestinal: Negative for nausea and abdominal pain.  Genitourinary: Positive for vaginal discharge. Negative for dysuria, urgency, frequency, hematuria, vaginal bleeding, vaginal pain and pelvic pain.       Objective:   Physical Exam  Constitutional: She is oriented to person, place, and time. She appears well-developed and well-nourished.  Cardiovascular: Normal rate, regular rhythm and normal heart sounds.   Pulmonary/Chest: Effort normal and breath sounds normal. No respiratory distress. She exhibits no tenderness.  Neurological: She is alert and oriented to person, place, and time.  Skin: Skin is warm and dry.  Psychiatric: She has a normal mood and affect.   Results for orders placed in visit on 05/07/13  POCT WET PREP (WET [place])      Result Value Range   WBC, Wet Prep HPF POC       Bacteria  Wet Prep HPF POC moderate     Clue Cells Wet Prep HPF POC None     Yeast Wet Prep HPF POC Few     Trichomonas Wet Prep HPF POC none            Assessment & Plan:  1. Itching - POCT Wet Prep [facility]  2. Vaginal candidiasis - miconazole (MONISTAT 7) 2 % vaginal cream; Place 7 Applicatorfuls vaginally at bedtime.  Dispense: 45 g; Refill: 1 -proper perineal hygiene -discussed decreasing risk of yeast infections -RTO if symptoms worsen -Patient verbalized understanding -Coralie Keens, FNP-C

## 2013-05-08 ENCOUNTER — Telehealth: Payer: Self-pay | Admitting: *Deleted

## 2013-05-08 MED ORDER — MICONAZOLE NITRATE 2 % VA CREA: 1.0000 | TOPICAL_CREAM | Freq: Every day | VAGINAL | Status: DC

## 2013-05-08 NOTE — Telephone Encounter (Signed)
Mae pt states that antibiotic was supposed to be sent in to [facility] yesterday and still hasnt received anything - please order and send asap

## 2013-05-12 ENCOUNTER — Ambulatory Visit: Admitting: General Practice

## 2013-05-17 NOTE — Telephone Encounter (Signed)
Sent on 9/5

## 2015-09-01 ENCOUNTER — Encounter (INDEPENDENT_AMBULATORY_CARE_PROVIDER_SITE_OTHER): Payer: Self-pay | Admitting: *Deleted

## 2015-09-26 ENCOUNTER — Ambulatory Visit (INDEPENDENT_AMBULATORY_CARE_PROVIDER_SITE_OTHER): Admitting: Internal Medicine

## 2015-10-05 ENCOUNTER — Encounter (INDEPENDENT_AMBULATORY_CARE_PROVIDER_SITE_OTHER): Payer: Self-pay | Admitting: *Deleted

## 2015-10-05 ENCOUNTER — Ambulatory Visit (INDEPENDENT_AMBULATORY_CARE_PROVIDER_SITE_OTHER): Admitting: Internal Medicine

## 2015-10-05 ENCOUNTER — Encounter (INDEPENDENT_AMBULATORY_CARE_PROVIDER_SITE_OTHER): Payer: Self-pay | Admitting: Internal Medicine

## 2015-10-05 VITALS — BP 90/52 | HR 76 | Temp 98.3°F | Ht 62.5 in | Wt 240.4 lb

## 2015-10-05 DIAGNOSIS — R1011 Right upper quadrant pain: Secondary | ICD-10-CM | POA: Insufficient documentation

## 2015-10-05 LAB — HEPATIC FUNCTION PANEL
ALT: 16 U/L (ref 6–29)
AST: 14 U/L (ref 10–30)
Albumin: 3.9 g/dL (ref 3.6–5.1)
Alkaline Phosphatase: 67 U/L (ref 33–115)
BILIRUBIN INDIRECT: 0.4 mg/dL (ref 0.2–1.2)
Bilirubin, Direct: 0.1 mg/dL (ref <=0.2)
Total Bilirubin: 0.5 mg/dL (ref 0.2–1.2)
Total Protein: 6.8 g/dL (ref 6.1–8.1)

## 2015-10-05 NOTE — Patient Instructions (Signed)
Hepatic function. US abdomen. Further  Recommendations to follow.

## 2015-10-05 NOTE — Progress Notes (Addendum)
   Subjective:    Patient ID: Erica Higgins, female    DOB: 1971/09/03, 45 y.o.   MRN: [id number]  HPI Referred by Dr. Manuella Ghazi. She tells me a few months ago she had epigastric pain radiating into her rt upper quadrant and into her back. She says the pain was so bad she cried. She also episodes of diarrhea associated with her symptoms.  She says she has had the pain off and on but they are not as bad. If she eats pizza or lasagna, she has more episodes of diarrhea. Anything spicy or greasy bother her. She underwent an US abdomen in [place] and a HIDA scan and all were normal. She had labs drawn and she reports they were all normal.  [employer]ace[employer] foods do not bother her.  Her appetite is good. She denies any acid reflux. She has some aching in her rt upper quadrant.   07/22/2015 HIDA scan: Normal scan with normal GB ejection fraction.   Review of Systems Past Medical History  Diagnosis Date  . Obesity   . Depression   . Polycystic ovarian disease   . Abdominal pain, acute, right upper quadrant     Past Surgical History  Procedure Laterality Date  . Tonsillectomy  1996  . Miniscus repair  2003 and 2005  . Cesarean section      Allergies  Allergen Reactions  . Ivp Dye [Iodinated Diagnostic Agents] Anaphylaxis  . Shellfish Allergy Anaphylaxis  . Latex Hives  . Penicillins Hives    No current outpatient prescriptions on file prior to visit.   No current facility-administered medications on file prior to visit.        Objective:   Physical Exam Blood pressure 90/52, pulse 76, temperature 98.3 F (36.8 C), height 5' 2.5" (1.588 m), weight 240 lb 6.4 oz (109.045 kg). Alert and oriented. Skin warm and dry. Oral mucosa is moist.   . Sclera anicteric, conjunctivae is pink. Thyroid not enlarged. No cervical lymphadenopathy. Lungs clear. Heart regular rate and rhythm.  Abdomen is soft. Bowel sounds are positive. No hepatomegaly. No abdominal masses felt. Tenderness rt upper abdomen  No  edema to lower extremities.         Assessment & Plan:  Rt upper quadrant pain. ? GB disease though all labs, [place] and HIDA are normal  Avoid fatty foods. Hepatic function today. US abdomen. Further recommendations to follow. May need referral to talk with a surgeon.

## 2015-10-10 ENCOUNTER — Telehealth (INDEPENDENT_AMBULATORY_CARE_PROVIDER_SITE_OTHER): Payer: Self-pay | Admitting: *Deleted

## 2015-10-10 NOTE — Telephone Encounter (Signed)
Patient saw Terri on 2/12017 for RUQ abd pain, nausea.  She had a work up by Dr. Manuella Ghazi November of last year with Neg [place] and [place]. States Labs none sent.  Terri order another [place] and more labs.  Referral to surgeon if Neg for GB disease.  Patient does not want to do another [place] unless absolutely necessary since she just had one and feels she is not getting any answers.  Insuance will not pay she thinks since she just had done in 11/16.  Keavie at Dr. Trena Platt called and ask that I get you to look at this and see what your opinion is.  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     Thank you

## 2015-10-11 NOTE — Telephone Encounter (Signed)
Will see patient on 08/11/2069

## 2015-10-11 NOTE — Telephone Encounter (Signed)
Per NUR she does not need [place] and he will see her this Thursday afternoon at 4.  I will let Lelon Frohlich know to cancel [place] and call her to tell her of appt.

## 2015-10-13 ENCOUNTER — Ambulatory Visit (INDEPENDENT_AMBULATORY_CARE_PROVIDER_SITE_OTHER): Admitting: Internal Medicine

## 2015-10-13 ENCOUNTER — Encounter (INDEPENDENT_AMBULATORY_CARE_PROVIDER_SITE_OTHER): Payer: Self-pay | Admitting: Internal Medicine

## 2015-10-13 ENCOUNTER — Ambulatory Visit (HOSPITAL_COMMUNITY)

## 2015-10-13 VITALS — BP 100/70 | HR 64 | Temp 98.0°F | Resp 18 | Ht 62.5 in | Wt 240.3 lb

## 2015-10-13 DIAGNOSIS — R1011 Right upper quadrant pain: Secondary | ICD-10-CM

## 2015-10-13 MED ORDER — HYOSCYAMINE SULFATE 0.125 MG SL SUBL: 0.2500 mg | SUBLINGUAL_TABLET | Freq: Three times a day (TID) | SUBLINGUAL | Status: DC | PRN

## 2015-10-13 NOTE — Progress Notes (Signed)
Presenting complaint;  Epigastric and right upper quadrant abdominal pain.  History of present illness:  Erica Higgins is 45 year old Caucasian female who was referred through courtesy of Dr. Manuella Ghazi for evaluation of epigastric and right upper quadrant abdominal pain and negative workup. She was seen in our office by Ms. Setzer, NP felt that she had a refractory disease. Prior lab studies are pertinent for mildly elevated alkaline phosphatase. Repeat LFTs were normal. She felt that patient had biliary tract disease and recommended repeating ultrasound. Patient requested to be reevaluated before further studies. She was also concerned that repeat ultrasound cannot be covered since she had one less than 3 months ago. Patient states her pain started on 07/20/2015 while she was at work. She did not have any symptoms that morning when she left for work. Pain was sharp and very intense. It brought tears to her eyes. 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 She states pain started in midepigastric region and radiated laterally and into her back. She had slight nausea but no vomiting fever or chills. She also had an explosive bowel movement but does not recall she had rectal bleeding or melena. She was evaluated by Dr. Manuella Ghazi who was concerned that she may be having biliary colic and recommended hospitalization but she declined. Pain resolved within 15-20 minutes. She had blood work ultrasound and HIDA scan which is reviewed under lab studies. Ultrasound was negative for cholelithiasis and HIDA scan revealed normal EF. Since then she has had 3 more episodes and last episode was on 10/05/2015. These episodes have been milder and she did have diarrhea on one occasion. She feels these attacks may have occurred after she ate spaghetti past and pizza. She has good appetite. She states she has lost 23 pounds in the last 3 months but she is not trying to lose weight. She has had pain in small  joints of her hands but sedimentation rate an artifact or were negative. She denies heartburn infrascapular or shoulder pain. She has history of migraine and may take ibuprofen 800 mg no more than once a month. She does not take other OTC medications. She takes Zomig no more than once a month for migraine. She does not drink alcohol or smoke cigarettes.    Current Medications: Outpatient Encounter Prescriptions as of 10/13/2015  Medication Sig  . FLUoxetine (PROZAC) 20 MG tablet Take 20 mg by mouth daily.  Marland Kitchen zolmitriptan (ZOMIG-ZMT) 5 MG disintegrating tablet Take 5 mg by mouth as needed.    No facility-administered encounter medications on file as of 10/13/2015.   Past Medical History  Diagnosis Date  . Obesity   . Depression   . Polycystic ovarian disease   . Abdominal pain, acute, right upper quadrant    Past Surgical History  Procedure Laterality Date  . Tonsillectomy  1996  . Miniscus repair  2003 and 2005  . Cesarean section        Objective: Blood pressure 100/70, pulse 64, temperature 98 F (36.7 C), temperature source Oral, resp. rate 18, height 5' 2.5" (1.588 m), weight 240 lb 4.8 oz (108.999 kg). Patient is alert and in no acute distress. Conjunctiva is pink. Sclera is nonicteric Oropharyngeal mucosa is normal. No neck masses or thyromegaly noted. Cardiac exam with regular rhythm normal S1 and S2. No murmur or gallop noted. Lungs are clear to auscultation. Abdomen is full. Bowel sounds are normal. On palpation abdomen is soft and nontender without organomegaly or masses. No LE  edema or clubbing noted.  Labs/studies Results: Lab data from 07/05/2015 WBC 9.2, H&H 13.2 and 39.8 and platelet count 306K. Sedimentation rate 6 RA latex turbidity less than 10(normal).  Lab data from 07/20/2015(day she had pain) WBC 12.7, H&H 13.6 and 41.9 and platelet count 333K Serum sodium 140, potassium 4.0, chloride 97, CO2 28.2, glucose 98, BUN 14, creatinine 0.67. Bilirubin  0.2, AP 98(32-92), AST 15.1 ALT 19 total protein 7.4, albumin 4.4 and serum calcium 8.9.  Lab data from 10/05/2015 Bilirubin 0.5, AP 67, AST 14, ALT 16, total protein 6.8 and albumin 3.9  Ultrasound on 07/21/2015 negative for cholelithiasis or dilated bile duct.  HIDA scan with CCK on 07/22/2015 revealed EF of 79% and her symptoms are not reproduced.  Assessment:  Erica Higgins's symptom complex is suggestive of biliary colic but ultrasound is negative for cholelithiasis or dilated bile duct and HIDA scan revealed normal EF. Alkaline phosphatase back in November 2016 was mildly elevated but normal 9 days ago. Differential diagnosis includes peptic ulcer disease and pain could also be due to irritable bowel syndrome. She could have we have tiny gallstones not picked up on initial ultrasound and if pain persists and no etiology is found repeat ultrasound would be a good idea.   Plan:  Hyoscyamine sublingual 2 tablets 3 times a day when necessary. Hemoccult 1. H. pylori serology. She will have LFTs, serum embolism lipase within 6-12 hours of onset of pain should she have another episode. Further recommendations to follow.

## 2015-10-13 NOTE — Patient Instructions (Signed)
Hemoccult 1. Blood work to be done in 6-12 hours after onset of pain.

## 2015-11-17 ENCOUNTER — Encounter (INDEPENDENT_AMBULATORY_CARE_PROVIDER_SITE_OTHER): Payer: Self-pay

## 2015-11-22 ENCOUNTER — Encounter (INDEPENDENT_AMBULATORY_CARE_PROVIDER_SITE_OTHER): Payer: Self-pay | Admitting: *Deleted

## 2015-11-29 ENCOUNTER — Other Ambulatory Visit (INDEPENDENT_AMBULATORY_CARE_PROVIDER_SITE_OTHER): Payer: Self-pay | Admitting: Internal Medicine

## 2015-11-29 DIAGNOSIS — R1013 Epigastric pain: Principal | ICD-10-CM

## 2015-11-29 DIAGNOSIS — G8929 Other chronic pain: Secondary | ICD-10-CM

## 2015-11-30 ENCOUNTER — Encounter (HOSPITAL_COMMUNITY)
Admission: RE | Disposition: A | Discharge status: Home / Self Care | Payer: Self-pay | Source: Ambulatory Visit | Attending: Internal Medicine

## 2015-11-30 ENCOUNTER — Telehealth (INDEPENDENT_AMBULATORY_CARE_PROVIDER_SITE_OTHER): Payer: Self-pay | Admitting: *Deleted

## 2015-11-30 ENCOUNTER — Ambulatory Visit (HOSPITAL_COMMUNITY)
Admission: RE | Admit: 2015-11-30 | Discharge: 2015-11-30 | Disposition: A | Discharge status: Home / Self Care | Source: Ambulatory Visit | Attending: Internal Medicine | Admitting: Internal Medicine

## 2015-11-30 SURGERY — EGD (ESOPHAGOGASTRODUODENOSCOPY)
Anesthesia: Moderate Sedation

## 2015-11-30 NOTE — OR Nursing (Signed)
Patient showed up for procedure and was told Dr. Laural Golden was running behind. Patient upset about having to wait 2 hours for her procedure. Patient stated, "My Erica Higgins has to drive back to [facility] and pick up my kids from daycare and I have not eaten since eight o'clock and I'm getting a migraine." Patient stated she wanted her procedure to be rescheduled. Dr. Laural Golden and Lacretia Nicks notified of the above.

## 2015-11-30 NOTE — Telephone Encounter (Signed)
Patient presented to office, complaining because Dr Laural Golden was running behind at the hospital and she couldn't get her endoscopy done on time. I tried to explain that emergencies do happen from time to time and we have no control over that. I offered to reschedule her to 01/25/16 at 1100 (the next early morning appt) patient refused and left office.

## 2016-02-29 ENCOUNTER — Encounter: Payer: Self-pay | Admitting: Neurology

## 2016-02-29 ENCOUNTER — Ambulatory Visit (INDEPENDENT_AMBULATORY_CARE_PROVIDER_SITE_OTHER): Admitting: Neurology

## 2016-02-29 VITALS — BP 129/76 | HR 63 | Ht 62.5 in | Wt 247.8 lb

## 2016-02-29 DIAGNOSIS — R202 Paresthesia of skin: Secondary | ICD-10-CM | POA: Diagnosis not present

## 2016-02-29 DIAGNOSIS — E669 Obesity, unspecified: Secondary | ICD-10-CM | POA: Insufficient documentation

## 2016-02-29 MED ORDER — GABAPENTIN 300 MG PO CAPS: 600.0000 mg | ORAL_CAPSULE | Freq: Three times a day (TID) | ORAL | Status: DC

## 2016-02-29 NOTE — Progress Notes (Signed)
PATIENT: Erica Higgins DOB: 01/14/1971  Chief Complaint  Patient presents with  . Pain    She has had pain in all ten toes for the last 7 months.  Pain worsens with walking and with cold temperatures.  She is unable to walk without shoes or socks due to the pain.  She had labs that ruled out RA.  She has not tried any medications for her symptoms.     HISTORICAL  Erica Higgins is a 45 years old right-handed female seen in refer by podiatrist Dr. Irving Shows for evaluation of bilateral toe pain in February 29 2016, her primary care physician is Erica Higgins  She had past medical history of polycystic ovarian disease, depression, migraine headaches  In December 2016, she woke up noticed all 10 toes hurting, burning, achy, throbbing pain, initially it was intermittent, gradually getting worse over the past 7 months, now become constant especially after she very weight, she denies gait difficulty, and has chronic low back pain, nausea radiating pain,  She denies bowel and bladder incontinence, she denies bilateral upper extremity involvement.   I reviewed laboratory evaluation from September 2016, normal CBC, CMP, lipase level, mild elevated LDL 113, ESR, rheumatoid factor was normal  REVIEW OF SYSTEMS: Full 14 system review of systems performed and notable only for Fatigue, swelling in legs, feeling hot, feeling cold, joint pain, achy muscles, incontinence, allergy, headaches  ALLERGIES: Allergies  Allergen Reactions  . Ivp Dye [Iodinated Diagnostic Agents] Anaphylaxis  . Shellfish Allergy Anaphylaxis  . Latex Hives  . Penicillins Hives    HOME MEDICATIONS: Current Outpatient Prescriptions  Medication Sig Dispense Refill  . FLUoxetine (PROZAC) 20 MG tablet Take 20 mg by mouth daily.    Marland Kitchen zolmitriptan (ZOMIG-ZMT) 5 MG disintegrating tablet Take 5 mg by mouth as needed.      No current facility-administered medications for this visit.    PAST MEDICAL HISTORY: Past Medical  History  Diagnosis Date  . Obesity   . Depression   . Polycystic ovarian disease   . Abdominal pain, acute, right upper quadrant   . Pain     Toes on bilateral feet  . Migraine     PAST SURGICAL HISTORY: Past Surgical History  Procedure Laterality Date  . Tonsillectomy  1996  . Miniscus repair  2003 and 2005  . Cesarean section      FAMILY HISTORY: Family History  Problem Relation Age of Onset  . Emphysema Father     smoker  . Allergies Father   . Allergies Mother   . Hypertension Mother   . Heart attack Paternal Grandfather   . Colon cancer Paternal Aunt   . Rheum arthritis Mother   . Osteoporosis Mother   . Hyperlipidemia Father   . Hypertension Father     SOCIAL HISTORY:  Social History   Social History  . Marital Status: Single    Spouse Name: N/A  . Number of Children: 2  . Years of Education: Bachelors   Occupational History  . Mental Health Tech - [employer]    Social History Main Topics  . Smoking status: Former Smoker -- 0.30 packs/day for 2 years    Types: Cigarettes    Quit date: 09/03/1992  . Smokeless tobacco: Never Used  . Alcohol Use: No  . Drug Use: No  . Sexual Activity: Not on file   Other Topics Concern  . Not on file   Social History Narrative   Lives at home with  her two children (twins).   Right-handed.   2 cups caffeine per day.     PHYSICAL EXAM   Filed Vitals:   02/29/16 1604  BP: 129/76  Pulse: 63  Height: 5' 2.5" (1.588 m)  Weight: 247 lb 12 oz (112.379 kg)    Not recorded      Body mass index is 44.56 kg/(m^2).  PHYSICAL EXAMNIATION:  Gen: NAD, conversant, well nourised, obese, well groomed                     Cardiovascular: Regular rate rhythm, no peripheral edema, warm, nontender. Eyes: Conjunctivae clear without exudates or hemorrhage Neck: Supple, no carotid bruise. Pulmonary: Clear to auscultation bilaterally   NEUROLOGICAL EXAM:  MENTAL STATUS: Speech:    Speech is normal; fluent and spontaneous  with normal comprehension.  Cognition:     Orientation to time, place and person     Normal recent and remote memory     Normal Attention span and concentration     Normal Language, naming, repeating,spontaneous speech     Fund of knowledge   CRANIAL NERVES: CN II: Visual fields are full to confrontation. Fundoscopic exam is normal with sharp discs and no vascular changes. Pupils are round equal and briskly reactive to light. CN III, IV, VI: extraocular movement are normal. No ptosis. CN V: Facial sensation is intact to pinprick in all 3 divisions bilaterally. Corneal responses are intact.  CN VII: Face is symmetric with normal eye closure and smile. CN VIII: Hearing is normal to rubbing fingers CN IX, X: Palate elevates symmetrically. Phonation is normal. CN XI: Head turning and shoulder shrug are intact CN XII: Tongue is midline with normal movements and no atrophy.  MOTOR: There is no pronator drift of out-stretched arms. Muscle bulk and tone are normal. Muscle strength is normal.  REFLEXES: Reflexes are 2+ and symmetric at the biceps, triceps, knees, and ankles. Plantar responses are flexor.  SENSORY: Intact to light touch, pinprick, positional sensation and vibratory sensation are intact in fingers and toes.  COORDINATION: Rapid alternating movements and fine finger movements are intact. There is no dysmetria on finger-to-nose and heel-knee-shin.    GAIT/STANCE: Posture is normal. Gait is steady with normal steps, base, arm swing, and turning. Heel and toe walking are normal. Tandem gait is normal.  Romberg is absent.   DIAGNOSTIC DATA (LABS, IMAGING, TESTING) - I reviewed patient records, labs, notes, testing and imaging myself where available.   ASSESSMENT AND PLAN  Erica Higgins is a 45 y.o. female   Bilateral toes paresthesia  Length dependent pattern is most consistent with peripheral neuropathy  Complete evaluation with EMG nerve conduction study  Laboratory  evaluation for etiology  Gabapentin 300 mg to 600 mg every night   Erica Higgins, M.D. Ph.D.  [facility] [address], [place], [place] [address] Ph: [phone] Fax: [phone]  CC: Podiatrist Dr. Irving Shows, her primary care physician is Dr. Berenice Primas

## 2016-03-01 LAB — RPR: RPR Ser Ql: NONREACTIVE

## 2016-03-01 LAB — HEPATITIS PANEL, ACUTE
HEP B C IGM: NEGATIVE
Hep A IgM: NEGATIVE
Hepatitis B Surface Ag: NEGATIVE

## 2016-03-01 LAB — COMPREHENSIVE METABOLIC PANEL
ALBUMIN: 4.2 g/dL (ref 3.5–5.5)
ALT: 17 IU/L (ref 0–32)
AST: 18 IU/L (ref 0–40)
Albumin/Globulin Ratio: 1.4 (ref 1.2–2.2)
Alkaline Phosphatase: 94 IU/L (ref 39–117)
BUN / CREAT RATIO: 18 (ref 9–23)
BUN: 13 mg/dL (ref 6–24)
Bilirubin Total: <0.2 mg/dL (ref 0.0–1.2)
CO2: 24 mmol/L (ref 18–29)
CREATININE: 0.74 mg/dL (ref 0.57–1.00)
Calcium: 9.1 mg/dL (ref 8.7–10.2)
Chloride: 98 mmol/L (ref 96–106)
GFR, EST AFRICAN AMERICAN: 115 mL/min/{1.73_m2} (ref >59)
GFR, EST NON AFRICAN AMERICAN: 100 mL/min/{1.73_m2} (ref >59)
GLOBULIN, TOTAL: 3 g/dL (ref 1.5–4.5)
GLUCOSE: 102 mg/dL — AB (ref 65–99)
Potassium: 3.8 mmol/L (ref 3.5–5.2)
Sodium: 140 mmol/L (ref 134–144)
TOTAL PROTEIN: 7.2 g/dL (ref 6.0–8.5)

## 2016-03-01 LAB — CBC
HEMATOCRIT: 39.6 % (ref 34.0–46.6)
HEMOGLOBIN: 13.3 g/dL (ref 11.1–15.9)
MCH: 29.2 pg (ref 26.6–33.0)
MCHC: 33.6 g/dL (ref 31.5–35.7)
MCV: 87 fL (ref 79–97)
Platelets: 315 10*3/uL (ref 150–379)
RBC: 4.56 x10E6/uL (ref 3.77–5.28)
RDW: 13.6 % (ref 12.3–15.4)
WBC: 9.3 10*3/uL (ref 3.4–10.8)

## 2016-03-01 LAB — PROTEIN ELECTROPHORESIS
A/G RATIO SPE: 1.1 (ref 0.7–1.7)
Albumin ELP: 3.7 g/dL (ref 2.9–4.4)
Alpha 1: 0.2 g/dL (ref 0.0–0.4)
Alpha 2: 0.7 g/dL (ref 0.4–1.0)
BETA: 1.1 g/dL (ref 0.7–1.3)
GLOBULIN, TOTAL: 3.5 g/dL (ref 2.2–3.9)
Gamma Globulin: 1.4 g/dL (ref 0.4–1.8)

## 2016-03-01 LAB — ANA W/REFLEX IF POSITIVE: Anti Nuclear Antibody(ANA): NEGATIVE

## 2016-03-01 LAB — FOLATE: Folate: 8.3 ng/mL (ref >3.0)

## 2016-03-01 LAB — VITAMIN B12: Vitamin B-12: 254 pg/mL (ref 211–946)

## 2016-03-01 LAB — TSH: TSH: 1.23 u[IU]/mL (ref 0.450–4.500)

## 2016-03-01 LAB — HIV ANTIBODY (ROUTINE TESTING W REFLEX): HIV Screen 4th Generation wRfx: NONREACTIVE

## 2016-03-01 LAB — C-REACTIVE PROTEIN: CRP: 12.2 mg/L — AB (ref 0.0–4.9)

## 2016-03-01 LAB — SEDIMENTATION RATE: SED RATE: 4 mm/h (ref 0–32)

## 2016-04-24 ENCOUNTER — Ambulatory Visit (INDEPENDENT_AMBULATORY_CARE_PROVIDER_SITE_OTHER): Payer: Self-pay | Admitting: Neurology

## 2016-04-24 ENCOUNTER — Ambulatory Visit (INDEPENDENT_AMBULATORY_CARE_PROVIDER_SITE_OTHER): Admitting: Neurology

## 2016-04-24 DIAGNOSIS — E669 Obesity, unspecified: Secondary | ICD-10-CM

## 2016-04-24 DIAGNOSIS — R202 Paresthesia of skin: Secondary | ICD-10-CM

## 2016-04-24 DIAGNOSIS — Z0289 Encounter for other administrative examinations: Secondary | ICD-10-CM

## 2016-04-25 NOTE — Procedures (Signed)
   NCS (NERVE CONDUCTION STUDY) WITH EMG (ELECTROMYOGRAPHY) REPORT   STUDY DATE: August 23rd 2017 PATIENT NAME: Erica Higgins DOB: [birth year] MRN: [id number]    TECHNOLOGIST: Laretta Alstrom ECTROMYOGRAPHER: Marcial Pacas M.D.  CLINICAL INFORMATION:  45 years old female presented with bilateral toes paresthesia since December 2016  FINDINGS: NERVE CONDUCTION STUDY: Bilateral peroneal sensory responses were normal. Bilateral peroneal to EDB and tibial motor responses were normal. Bilateral tibial H reflexes were normal and symmetric.  NEEDLE ELECTROMYOGRAPHY: Selected needle examinations were performed at right lower extremity muscles and right lumbosacral paraspinal muscles.  Needle examination of right tibialis anterior, tibialis posterior, medial gastrocnemius, peroneal longus, abductor pollicis longus was normal.  There was no spontaneous activity at right lumbosacral paraspinal muscles right L4-5 S1.  IMPRESSION:   This is a normal study, there is no electrodiagnostic evidence of large fiber peripheral neuropathy, or right lumbosacral radiculopathy.   INTERPRETING PHYSICIAN:   Marcial Pacas M.D. Ph.D. [facility] [address] [place], [place] [address] [phone]

## 2016-04-25 NOTE — Progress Notes (Signed)
EMG nerve conduction study report on the procedure section

## 2017-11-06 ENCOUNTER — Telehealth: Payer: Self-pay | Admitting: Oncology

## 2017-11-06 NOTE — Telephone Encounter (Signed)
Spoke with patient to confirm afternoon [facility] appointment for 3/13, packet will be emailed to patient

## 2017-11-08 ENCOUNTER — Encounter: Payer: Self-pay | Admitting: *Deleted

## 2017-11-08 ENCOUNTER — Other Ambulatory Visit: Payer: Self-pay | Admitting: *Deleted

## 2017-11-08 DIAGNOSIS — C50511 Malignant neoplasm of lower-outer quadrant of right female breast: Secondary | ICD-10-CM | POA: Insufficient documentation

## 2017-11-08 DIAGNOSIS — Z170 Estrogen receptor positive status [ER+]: Principal | ICD-10-CM

## 2017-11-11 ENCOUNTER — Telehealth: Payer: Self-pay | Admitting: *Deleted

## 2017-11-11 NOTE — Telephone Encounter (Signed)
Discussed with pt appt date and time for [facility] on 11/13/17. Denies further needs at this time.

## 2017-11-13 ENCOUNTER — Inpatient Hospital Stay

## 2017-11-13 ENCOUNTER — Other Ambulatory Visit: Payer: Self-pay | Admitting: *Deleted

## 2017-11-13 ENCOUNTER — Encounter: Payer: Self-pay | Admitting: Physical Therapy

## 2017-11-13 ENCOUNTER — Ambulatory Visit: Payer: Self-pay | Admitting: Surgery

## 2017-11-13 ENCOUNTER — Ambulatory Visit: Attending: Surgery | Admitting: Physical Therapy

## 2017-11-13 ENCOUNTER — Ambulatory Visit
Admission: RE | Admit: 2017-11-13 | Discharge: 2017-11-13 | Disposition: A | Discharge status: Home / Self Care | Source: Ambulatory Visit | Attending: Radiation Oncology | Admitting: Radiation Oncology

## 2017-11-13 ENCOUNTER — Inpatient Hospital Stay: Attending: Hematology and Oncology | Admitting: Hematology and Oncology

## 2017-11-13 ENCOUNTER — Other Ambulatory Visit: Payer: Self-pay

## 2017-11-13 ENCOUNTER — Encounter: Payer: Self-pay | Admitting: Hematology and Oncology

## 2017-11-13 DIAGNOSIS — C50511 Malignant neoplasm of lower-outer quadrant of right female breast: Secondary | ICD-10-CM | POA: Insufficient documentation

## 2017-11-13 DIAGNOSIS — Z170 Estrogen receptor positive status [ER+]: Secondary | ICD-10-CM | POA: Insufficient documentation

## 2017-11-13 DIAGNOSIS — R293 Abnormal posture: Secondary | ICD-10-CM | POA: Diagnosis present

## 2017-11-13 DIAGNOSIS — C50911 Malignant neoplasm of unspecified site of right female breast: Secondary | ICD-10-CM

## 2017-11-13 LAB — CBC WITH DIFFERENTIAL (CANCER CENTER ONLY)
BASOS PCT: 1 %
Basophils Absolute: 0.1 10*3/uL (ref 0.0–0.1)
EOS ABS: 0.1 10*3/uL (ref 0.0–0.5)
Eosinophils Relative: 2 %
HCT: 41.5 % (ref 34.8–46.6)
Hemoglobin: 13.7 g/dL (ref 11.6–15.9)
Lymphocytes Relative: 22 %
Lymphs Abs: 1.9 10*3/uL (ref 0.9–3.3)
MCH: 28.9 pg (ref 25.1–34.0)
MCHC: 33.1 g/dL (ref 31.5–36.0)
MCV: 87.5 fL (ref 79.5–101.0)
Monocytes Absolute: 0.5 10*3/uL (ref 0.1–0.9)
Monocytes Relative: 6 %
NEUTROS ABS: 5.8 10*3/uL (ref 1.5–6.5)
Neutrophils Relative %: 69 %
Platelet Count: 295 10*3/uL (ref 145–400)
RBC: 4.75 MIL/uL (ref 3.70–5.45)
RDW: 13.7 % (ref 11.2–14.5)
WBC: 8.4 10*3/uL (ref 3.9–10.3)

## 2017-11-13 LAB — CMP (CANCER CENTER ONLY)
ALK PHOS: 87 U/L (ref 40–150)
ALT: 22 U/L (ref 0–55)
ANION GAP: 6 (ref 3–11)
AST: 16 U/L (ref 5–34)
Albumin: 3.6 g/dL (ref 3.5–5.0)
BUN: 12 mg/dL (ref 7–26)
CALCIUM: 9.4 mg/dL (ref 8.4–10.4)
CHLORIDE: 104 mmol/L (ref 98–109)
CO2: 29 mmol/L (ref 22–29)
CREATININE: 0.84 mg/dL (ref 0.60–1.10)
Glucose, Bld: 123 mg/dL (ref 70–140)
Potassium: 3.8 mmol/L (ref 3.5–5.1)
Sodium: 139 mmol/L (ref 136–145)
Total Bilirubin: 0.5 mg/dL (ref 0.2–1.2)
Total Protein: 7.4 g/dL (ref 6.4–8.3)

## 2017-11-13 NOTE — Progress Notes (Signed)
Nutrition Assessment  Reason for Assessment:  Pt seen in Breast Clinic  ASSESSMENT:   47 year old female with new diagnosis of breast cancer.  Past medical history reviewed  Patient reports good appetite.  Medications:  reviewed  Labs: reviewed  Anthropometrics:   Height: 62.5 inches Weight: 247 lb BMI: 44   NUTRITION DIAGNOSIS: Food and nutrition related knowledge deficit related to new diagnosis of breast cancer as evidenced by no prior need for nutrition related information.  INTERVENTION:   Discussed and provided packet of information regarding nutritional tips for breast cancer patients.  Questions answered.  Teachback method used.  Contact information provided and patient knows to contact me with questions/concerns.    MONITORING, EVALUATION, and GOAL: Pt will consume a healthy plant based diet to maintain lean body mass throughout treatment.   Wenceslaus Gist B. Zenia Resides, [place], [place] Registered Dietitian [phone] (pager)

## 2017-11-13 NOTE — Progress Notes (Signed)
Radiation Oncology         [phone]) [phone] ________________________________  Initial Outpatient Consultation  Name: Erica Higgins MRN: [id number]  Date: 11/13/2017  DOB: 10-18-1970  [id number], Lavonia Drafts, MD   REFERRING PHYSICIAN: Erroll Luna, MD  DIAGNOSIS:    ICD-10-CM   1. 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 female who presented with a palpable right breast mass. Mammogram on 11/06/17 showed the palpable region is compatible with an epidermoid inclusion cyst. There was an incidental finding of an additional right breast mass at the 8 o'clock position, 8 cm from the nipple, measuring 1.3 x 1.4 x 1.6 cm. Biopsy on 10/30/17 showed invasive ductal carcinoma with DCIS with characteristics as described above in the diagnosis. Receptor status is  ER 90%, PR 100%, Ki67 2%, and Her2 negative. She presents to multidisciplinary clinic today to review the role of radiation therapy as part of her disease management.   The patient complains of knee pain that is described as a throbbing muscle ache. She is positive for glasses, runny nose, feet swelling, heartburn, incontinence, back pain, joint pain, headaches, numbness, anxiety, depression, and phobia of bugs.   Gynecologic History Age of first period: 35 Having Periods: No, last period 02/11/10 Using hormone replacement: No How many children: 2 Age of first live birth: 42 Has used birth control pills or hormone shots: No   PREVIOUS RADIATION THERAPY: No  PAST MEDICAL HISTORY:  has a past medical history of Abdominal pain, acute, right upper quadrant, Anxiety, Back pain, Depression, Joint pain, Migraine, Obesity, Pain, and  Polycystic ovarian disease.    PAST SURGICAL HISTORY: Past Surgical History:  Procedure Laterality Date  . CESAREAN SECTION    . MINISCUS REPAIR  2003 and 2005  . TONSILLECTOMY  1996    FAMILY HISTORY: family history includes Allergies in her father and mother; Breast cancer in her maternal grandmother; Colon cancer in her paternal aunt; Emphysema in her father; Heart attack in her paternal grandfather; Hyperlipidemia in her father; Hypertension in her father and mother; Osteoporosis in her mother; Rheum arthritis in her mother; Skin cancer in her mother.  SOCIAL HISTORY:  reports that she quit smoking about 25 years ago. Her smoking use included cigarettes. She has a 0.60 pack-year smoking history. she has never used smokeless tobacco. She reports that she drinks alcohol. She reports that she does not use drugs.  ALLERGIES: Ivp dye [iodinated diagnostic agents]; Latex; Penicillins; and Shellfish allergy  MEDICATIONS:  Current Outpatient Medications  Medication Sig Dispense Refill  . diazepam (VALIUM) 2 MG tablet TAKE ONE TABLET BY MOUTH EVERY TWELVE HOURS AS NEEDED  2  . FLUoxetine (PROZAC) 20 MG tablet Take 20 mg by mouth daily.    Marland Kitchen gabapentin (NEURONTIN) 300 MG capsule Take 2 capsules (600 mg total) by mouth 3 (three) times daily. (Patient not taking: Reported on 11/13/2017) 60 capsule 11  . ibuprofen (ADVIL,MOTRIN) 800 MG tablet Take 800 mg by mouth every 8 (eight) hours as needed.    . zolmitriptan (ZOMIG-ZMT) 5 MG disintegrating tablet Take 5 mg by mouth as needed.      No current facility-administered medications for this encounter.  REVIEW OF SYSTEMS: A 10+ POINT REVIEW OF SYSTEMS WAS OBTAINED including neurology, dermatology, psychiatry, cardiac, respiratory, lymph, extremities, GI, GU, Musculoskeletal, constitutional, breasts, reproductive, HEENT.  All pertinent positives are noted in the HPI.  All others are negative.   PHYSICAL EXAM:    General: Alert and oriented, in  no acute distress HEENT: Head is normocephalic. Extraocular movements are intact. Oropharynx is clear. Neck: Neck is supple, no palpable cervical or supraclavicular lymphadenopathy. Heart: Regular in rate and rhythm with no murmurs, rubs, or gallops. Chest: Clear to auscultation bilaterally, with no rhonchi, wheezes, or rales. Abdomen: Soft, nontender, nondistended, with no rigidity or guarding. Extremities: No cyanosis or edema. Lymphatics: see Neck Exam Skin: No concerning lesions. Musculoskeletal: symmetric strength and muscle tone throughout. Neurologic: Cranial nerves II through XII are grossly intact. No obvious focalities. Speech is fluent. Coordination is intact. Psychiatric: Judgment and insight are intact. Affect is appropriate. Breasts: She has a palpable superficial cyst in the inferior aspect of the right breast. Otherwise, she has dense breast tissue bilaterally but no obvious masses bilaterally.    ECOG = 0  0 - Asymptomatic (Fully active, able to carry on all predisease activities without restriction)  1 - Symptomatic but completely ambulatory (Restricted in physically strenuous activity but ambulatory and able to carry out work of a light or sedentary nature. For example, light housework, office work)  2 - Symptomatic, <50% in bed during the day (Ambulatory and capable of all self care but unable to carry out any work activities. Up and about more than 50% of waking hours)  3 - Symptomatic, >50% in bed, but not bedbound (Capable of only limited self-care, confined to bed or chair 50% or more of waking hours)  4 - Bedbound (Completely disabled. Cannot carry on any self-care. Totally confined to bed or chair)  5 - Death   Eustace Pen MM, Creech RH, Tormey DC, et al. [phone]). "Toxicity and response criteria of the [facility]". [place] Oncol. 5 (6): 649-55   LABORATORY DATA:  Lab Results  Component Value Date   WBC 8.4 11/13/2017   HGB 13.3  02/29/2016   HCT 41.5 11/13/2017   MCV 87.5 11/13/2017   PLT 295 11/13/2017   CMP     Component Value Date/Time   NA 139 11/13/2017 0840   NA 140 02/29/2016 1634   K 3.8 11/13/2017 0840   CL 104 11/13/2017 0840   CO2 29 11/13/2017 0840   GLUCOSE 123 11/13/2017 0840   BUN 12 11/13/2017 0840   BUN 13 02/29/2016 1634   CREATININE 0.84 11/13/2017 0840   CALCIUM 9.4 11/13/2017 0840   PROT 7.4 11/13/2017 0840   PROT 7.2 02/29/2016 1634   ALBUMIN 3.6 11/13/2017 0840   ALBUMIN 4.2 02/29/2016 1634   AST 16 11/13/2017 0840   ALT 22 11/13/2017 0840   ALKPHOS 87 11/13/2017 0840   BILITOT 0.5 11/13/2017 0840   GFRNONAA >60 11/13/2017 0840   GFRAA >60 11/13/2017 0840         RADIOGRAPHY: imaging viewed by me at tumor board today     IMPRESSION/PLAN: 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 The patient is scheduled for MRI (11/19/17), genetic testing (11/14/17), and mammaprint. She is a good candidate for breast conservation. She will proceed with right lumpectomy with sentinel lymph node biopsy. This will be followed by radiation therapy. Nurse navigators will refer her to [facility] in  [place] as she wishes to get radiation therapy close to home. She will then undergo hormone replacement for several years.   It was a pleasure meeting the patient today and she will proceed with radiation treatment in [place]. We discussed the risks, benefits, and side effects of radiotherapy. I recommend radiotherapy to the right breast to reduce her risk of locoregional recurrence by 2/3.  We discussed that radiation would take approximately 4-6 weeks to complete and that the patient would require a few weeks to heal following surgery before starting treatment planning. We spoke about acute effects including skin irritation and fatigue as well as much less common late effects including internal organ injury or irritation. We spoke about the latest  technology that is used to minimize the risk of late effects for patients undergoing radiotherapy to the breast. No guarantees of treatment were given. The patient is enthusiastic about proceeding with treatment.She will be seen in [facility] for postoperative follow-up and eventual CT simulation/treatment planning.  __________________________________________   Eppie Gibson, MD  This document serves as a record of services personally performed by Eppie Gibson, MD. It was created on her behalf by Bethann Humble, a trained medical scribe. The creation of this record is based on the scribe's personal observations and the provider's statements to them. This document has been checked and approved by the attending provider.

## 2017-11-13 NOTE — Therapy (Signed)
[place], [place], [address] Phone: [phone]   Fax:  [phone]  Physical Therapy Evaluation  Patient Details  Name: Erica Higgins MRN: [id number] Date of Birth: [birth year] Referring Provider: Dr. Erroll Luna   Encounter Date: 11/13/2017  PT End of Session - 11/13/17 1116    Visit Number  1    Number of Visits  2    Date for PT Re-Evaluation  01/08/18    PT Start Time  0928    PT Stop Time  0939 Also saw pt from [phone] for a total of 29 minutes    PT Time Calculation (min)  11 min    Activity Tolerance  Patient tolerated treatment well    Behavior During Therapy  [facility] for tasks assessed/performed       Past Medical History:  Diagnosis Date  . Abdominal pain, acute, right upper quadrant   . Anxiety   . Back pain   . Depression   . Joint pain   . Migraine   . Obesity   . Pain    Toes on bilateral feet  . Polycystic ovarian disease     Past Surgical History:  Procedure Laterality Date  . CESAREAN SECTION    . MINISCUS REPAIR  2003 and 2005  . TONSILLECTOMY  1996    There were no vitals filed for this visit.   Subjective Assessment - 11/13/17 1109    Subjective  Patient reports she is here today to be seen by her medical team for her newly diagnosed right breast cancer.    Pertinent History  Patient was diagnosed on 11/01/17 with right grade 2 invasive ductal carcinoma breast cancer. It measures 1.6 cm and is located in the right lower outer quadrant. It is ER/PR positive an HER2 negative with a ki67 of 2%. She has no other health problems.    Patient Stated Goals  Reduce lymphedema risk and learn post op shoulder ROM HEP    Currently in Pain?  No/denies         [facility] PT Assessment - 11/13/17 0001      Assessment   Medical Diagnosis  Right breast cancer    Referring Provider  Dr. [name] Cornett    Onset Date/Surgical Date  11/01/17    Hand Dominance  Right    Prior Therapy   none      Precautions   Precautions  Other (comment)    Precaution Comments  active cancer      Restrictions   Weight Bearing Restrictions  No      Balance Screen   Has the patient fallen in the past 6 months  No    Has the patient had a decrease in activity level because of a fear of falling?   No    Is the patient reluctant to leave their home because of a fear of falling?   No      Home Environment   Living Environment  Private residence    Living Arrangements  Children 60 y.o. 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    Leisure  She does not exercise      Cognition   Overall Cognitive Status  Within Functional Limits for tasks assessed  Posture/Postural Control   Posture/Postural Control  Postural limitations    Postural Limitations  Rounded Shoulders;Forward head      ROM / Strength   AROM / PROM / Strength  AROM;Strength      AROM   AROM Assessment Site  Shoulder;Cervical    Right/Left Shoulder  Right;Left    Right Shoulder Extension  37 Degrees    Right Shoulder Flexion  154 Degrees    Right Shoulder ABduction  158 Degrees    Right Shoulder Internal Rotation  82 Degrees    Right Shoulder External Rotation  75 Degrees    Left Shoulder Extension  32 Degrees    Left Shoulder Flexion  152 Degrees    Left Shoulder ABduction  162 Degrees    Left Shoulder Internal Rotation  70 Degrees    Left Shoulder External Rotation  88 Degrees    Cervical Flexion  WNL    Cervical Extension  WNL    Cervical - Right Side Bend  WNL    Cervical - Left Side Bend  WNL    Cervical - Right Rotation  WNL    Cervical - Left Rotation  WNL      Strength   Overall Strength  Within functional limits for tasks performed        LYMPHEDEMA/ONCOLOGY QUESTIONNAIRE - 11/13/17 1114      Type   Cancer Type  Right breast  cancer      Lymphedema Assessments   Lymphedema Assessments  Upper extremities      Right Upper Extremity Lymphedema   10 cm Proximal to Olecranon Process  42.7 cm    Olecranon Process  29.9 cm    10 cm Proximal to Ulnar Styloid Process  28.2 cm    Just Proximal to Ulnar Styloid Process  18.5 cm    Across Hand at [employer]  19.8 cm    At [place] of 2nd Digit  7.1 cm      Left Upper Extremity Lymphedema   10 cm Proximal to Olecranon Process  40.8 cm    Olecranon Process  30.7 cm    10 cm Proximal to Ulnar Styloid Process  27.8 cm    Just Proximal to Ulnar Styloid Process  18.6 cm    Across Hand at [employer]  20.1 cm    At [place] of 2nd Digit  6.8 cm          Objective measurements completed on examination: See above findings.    Patient was instructed today in a home exercise program today for post op shoulder range of motion. These included active assist shoulder flexion in sitting, scapular retraction, wall walking with shoulder abduction, and hands behind head external rotation.  She was encouraged to do these twice a day, holding 3 seconds and repeating 5 times when permitted by her physician.     PT Education - 11/13/17 1115    Education provided  Yes    Education Details  Lymphedema risk reduction and post op HEP    Person(s) Educated  Patient    Methods  Explanation;Demonstration;Handout    Comprehension  Returned demonstration;Verbalized understanding          PT Long Term Goals - 11/13/17 1121      PT LONG TERM GOAL #1   Title  Patient will demonstrate she has returned to baseline related to shoulder function and ROM.    Time  8    Period  Weeks  Status  New      Breast Clinic Goals - 11/13/17 1121      Patient will be able to verbalize understanding of pertinent lymphedema risk reduction practices relevant to her diagnosis specifically related to skin care.   Time  1    Period  Days    Status  Achieved      Patient will be able to return  demonstrate and/or verbalize understanding of the post-op home exercise program related to regaining shoulder range of motion.   Time  1    Period  Days    Status  Achieved      Patient will be able to verbalize understanding of the importance of attending the postoperative After Breast Cancer Class for further lymphedema risk reduction education and therapeutic exercise.   Time  1    Period  Days    Status  Achieved            Plan - 11/13/17 1116    Clinical Impression Statement  Patient was diagnosed on 11/01/17 with right grade 2 invasive ductal carcinoma breast cancer. It measures 1.6 cm and is located in the right lower outer quadrant. It is ER/PR positive an HER2 negative with a ki67 of 2%. She has no other health problems. Her multidisciplinary medical team met prior to her assessments to determine a recommended treatment plan. She is planning to have an MRI, a right lumpectomy and sentinel node biopsy, Mammaprint testing, radiation, and anti-estrogen therapy. She will benefit from a post op PT visit to reassess and determine needs.    History and Personal Factors relevant to plan of care:  Single parent of twins; lives 48 minutes away from [place] Presentation  Stable    Clinical Decision Making  Low    Rehab Potential  Excellent    Clinical Impairments Affecting Rehab Potential  None    PT Frequency  -- Eval and 1 f/u visit    PT Treatment/Interventions  ADLs/Self Care Home Management;Therapeutic exercise;Patient/family education    PT Next Visit Plan  Will reassess 3-4 weeks post op     PT Home Exercise Plan  Shoulder ROM HEP    Consulted and Agree with Plan of Care  Patient       Patient will benefit from skilled therapeutic intervention in order to improve the following deficits and impairments:  Pain, Postural dysfunction, Decreased range of motion, Decreased knowledge of precautions, Impaired UE functional use  Visit Diagnosis: Malignant neoplasm of  lower-outer quadrant of right breast of female, estrogen receptor positive ([place]) - Plan: PT plan of care cert/re-cert  Abnormal posture - Plan: PT plan of care cert/re-cert   Patient will follow up at outpatient cancer rehab 3-4 weeks following surgery.  If the patient requires physical therapy at that time, a specific plan will be dictated and sent to the referring physician for approval. The patient was educated today on appropriate basic range of motion exercises to begin post operatively and the importance of attending the After Breast Cancer class following surgery.  Patient was educated today on lymphedema risk reduction practices as it pertains to recommendations that will benefit the patient immediately following surgery.  She verbalized good understanding.     Problem List Patient Active Problem List   Diagnosis Date Noted  . Malignant neoplasm of lower-outer quadrant of right breast of female, estrogen receptor positive ([place]) 11/08/2017  . Paresthesia 02/29/2016  . Obesity 02/29/2016  . Abdominal pain, right upper  quadrant 10/05/2015  . Left knee pain 12/05/2012  . Allergic rhinitis 11/27/2011  . Cough 11/18/2011    Annia Friendly, PT 11/13/17 11:25 AM  [place], [place], [address] Phone: [phone]   Fax:  [phone]  Name: Erica Higgins MRN: [id number] Date of Birth: Mar 14, 1971

## 2017-11-13 NOTE — H&P (Signed)
Erica Higgins Documented: 11/13/2017 7:25 AM Location: [facility] Patient #: [id number] DOB: 1971/02/22 Undefined / Language: Erica Higgins / Race: White Female  History of Present Illness [name] A. Kais Monje MD; 11/13/2017 11:22 AM) Patient words: Pt sent at the request of Dr [name] for right breast cyst that turned out to be sebaceous cyst but mammogram revealed a 1 .6 cm mass lower inner quadrant IDC grade 2 ER/PR pos her 2 neu negative. She denies symptoms  Family history of breast cancer.  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 No other history of mess, discharge or problem with her breast.  The patient is a 47 year old female.   Past Surgical History [name], RN; 11/13/2017 7:26 AM) Breast Biopsy Right. Cesarean Section - 1 Knee Surgery Left. Oral Surgery Thyroid Surgery  Diagnostic Studies History [name], RN; 11/13/2017 7:26 AM) Colonoscopy 5-10 years ago Mammogram within last year Pap Smear 1-5 years ago  Medication History [name], RN; 11/13/2017 7:26 AM) Medications Reconciled  Social History [name], RN; 11/13/2017 7:26 AM) Alcohol use Occasional alcohol use. Caffeine use Carbonated beverages. No drug use Tobacco use Former smoker.  Family History [name], RN; 11/13/2017 7:26 AM) Alcohol Abuse Family Members In General, Father. Anesthetic complications Mother. Arthritis Family Members In General, Mother. Cerebrovascular Accident Family Members In [employer] Members In General. Diabetes Mellitus Family Members In General. Heart Disease Family Members In General. Heart disease in female family member before age 67 Hypertension Family Members In General, Mother. Ischemic Bowel Disease Family Members In General. Melanoma Mother.  Pregnancy / Birth History [name], RN; 11/13/2017 7:26 AM) Age at menarche 59 years. Age of menopause <47 Contraceptive History Depo-provera,  Oral contraceptives. Gravida 2 Irregular periods Maternal age 26-35 Para 2  Other Problems [name], RN; 11/13/2017 7:26 AM) Anxiety Disorder Back Pain Breast Cancer Chest pain Gastroesophageal Reflux Disease Lump In Breast Migraine Headache Transfusion history     Review of Systems [name] RN; 11/13/2017 7:26 AM) General Present- Fatigue and Night Sweats. Not Present- Appetite Loss, Chills, Fever, Weight Gain and Weight Loss. Skin Present- Dryness and Ulcer. Not Present- Change in Wart/Mole, Hives, Jaundice, New Lesions, Non-Healing Wounds and Rash. HEENT Present- Seasonal Allergies, Sinus Pain and Wears glasses/contact lenses. Not Present- Earache, Hearing Loss, Hoarseness, Nose Bleed, Oral Ulcers, Ringing in the Ears, Sore Throat, Visual Disturbances and Yellow Eyes. Respiratory Present- Snoring. Not Present- Bloody sputum, Chronic Cough, Difficulty Breathing and Wheezing. Breast Present- Breast Mass and Skin Changes. Not Present- Breast Pain and Nipple Discharge. Cardiovascular Present- Swelling of Extremities. Not Present- Chest Pain, Difficulty Breathing Lying Down, Leg Cramps, Palpitations, Rapid Heart Rate and Shortness of Breath. Gastrointestinal Not Present- Abdominal Pain, Bloating, Bloody Stool, Change in Bowel Habits, Chronic diarrhea, Constipation, Difficulty Swallowing, Excessive gas, Gets full quickly at meals, Hemorrhoids, Indigestion, Nausea, Rectal Pain and Vomiting. Female Genitourinary Present- Frequency. Not Present- Nocturia, Painful Urination, Pelvic Pain and Urgency. Musculoskeletal Present- Back Pain. Not Present- Joint Pain, Joint Stiffness, Muscle Pain, Muscle Weakness and Swelling of Extremities. Neurological Present- Headaches, Numbness and Tingling. Not Present- Decreased Memory, Fainting, Seizures, Tremor, Trouble walking and Weakness. Psychiatric Present- Anxiety. Not Present- Bipolar, Change in Sleep Pattern, Depression, Fearful  and Frequent crying. Endocrine Present- Hot flashes. Not Present- Cold Intolerance, Excessive Hunger, Hair Changes, Heat Intolerance and New Diabetes.   Physical Exam ([name] [name] MD; 11/13/2017 11:22 AM)  General Mental Status-Alert. General Appearance-Consistent with stated age. Hydration-Well hydrated. Voice-Normal.  Head and Neck Head-normocephalic, atraumatic  with no lesions or palpable masses. Trachea-midline. Thyroid Gland Characteristics - normal size and consistency.  Eye Eyeball - Bilateral-Extraocular movements intact. Sclera/Conjunctiva - Bilateral-No scleral icterus.  Chest and Lung Exam Chest and lung exam reveals -quiet, even and easy respiratory effort with no use of accessory muscles and on auscultation, normal breath sounds, no adventitious sounds and normal vocal resonance. Inspection Chest Wall - Normal. Back - normal.  Breast Breast - Left-Symmetric, Non Tender, No Biopsy scars, no Dimpling, No Inflammation, No Lumpectomy scars, No Mastectomy scars, No Peau d' Orange. Breast - Right-Symmetric, Non Tender, No Biopsy scars, no Dimpling, No Inflammation, No Lumpectomy scars, No Mastectomy scars, No Peau d' Orange. Breast Lump-No Palpable Breast Mass.  Cardiovascular Cardiovascular examination reveals -normal heart sounds, regular rate and rhythm with no murmurs and normal pedal pulses bilaterally.  Neurologic Neurologic evaluation reveals -alert and oriented x 3 with no impairment of recent or remote memory. Mental Status-Normal.  Musculoskeletal Normal Exam - Left-Upper Extremity Strength Normal and Lower Extremity Strength Normal. Normal Exam - Right-Upper Extremity Strength Normal and Lower Extremity Strength Normal.  Lymphatic Head & Neck  General Head & Neck Lymphatics: Bilateral - Description - Normal. Axillary  General Axillary Region: Bilateral - Description - Normal. Tenderness - Non  Tender.    Assessment & Plan ([name] [name] MD; 11/13/2017 11:24 AM)  BREAST CANCER, RIGHT (C50.911) Impression: needs MRI due to density  Pt desires breast conservation after discussion today reviewed mastectomy as well   She would like right breast lumpectomy using wire given allergy and right sentinal lymph node mapping Risk of lumpectomy include bleeding, infection, seroma, more surgery, use of seed/wire, wound care, cosmetic deformity and the need for other treatments, death , blood clots, death. Pt agrees to proceed. Risk of sentinel lymph node mapping include bleeding, infection, lymphedema, shoulder pain. stiffness, dye allergy. cosmetic deformity , blood clots, death, need for more surgery. Pt agres to proceed.  Current Plans You are being scheduled for surgery- Our schedulers will call you.  You should hear from our office's scheduling department within 5 working days about the location, date, and time of surgery. We try to make accommodations for patient's preferences in scheduling surgery, but sometimes the OR schedule or the surgeon's schedule prevents [place] from making those accommodations.  If you have not heard from our office [phone]) in 5 working days, call the office and ask for your surgeon's nurse.  If you have other questions about your diagnosis, plan, or surgery, call the office and ask for your surgeon's nurse.  Pt Education - [facility] Breast Cancer Information Given - [facility] "Breast Journey" Package We discussed the staging and pathophysiology of breast cancer. We discussed all of the different options for treatment for breast cancer including surgery, chemotherapy, radiation therapy, Herceptin, and antiestrogen therapy. We discussed a sentinel lymph node biopsy as she does not appear to having lymph node involvement right now. We discussed the performance of that with injection of radioactive tracer and blue dye. We discussed that she would have an incision  underneath her axillary hairline. We discussed that there is a bout a 10-20% chance of having a positive node with a sentinel lymph node biopsy and we will await the permanent pathology to make any other first further decisions in terms of her treatment. One of these options might be to return to the operating room to perform an axillary lymph node dissection. We discussed about a 1-2% risk lifetime of chronic shoulder pain as well as  lymphedema associated with a sentinel lymph node biopsy. We discussed the options for treatment of the breast cancer which included lumpectomy versus a mastectomy. We discussed the performance of the lumpectomy with a wire placement. We discussed a 10-20% chance of a positive margin requiring reexcision in the operating room. We also discussed that she may need radiation therapy or antiestrogen therapy or both if she undergoes lumpectomy. We discussed the mastectomy and the postoperative care for that as well. We discussed that there is no difference in her survival whether she undergoes lumpectomy with radiation therapy or antiestrogen therapy versus a mastectomy. There is a slight difference in the local recurrence rate being 3-5% with lumpectomy and about 1% with a mastectomy. We discussed the risks of operation including bleeding, infection, possible reoperation. She understands her further therapy will be based on what her stages at the time of her operation.  Pt Education - [facility] breast cancer surgery: discussed with patient and provided information. Pt Education - [facility] Breast Biopsy HCI: discussed with patient and provided information. Pt Education - ABC (After Breast Cancer) Class Info: discussed with patient and provided information.

## 2017-11-13 NOTE — Assessment & Plan Note (Signed)
11/06/2017: Palpable right breast mass which on radiologic evaluation was epidermoid cyst; incidentally found additional right breast mass at 8 o'clock position: 1.6 cm: IDC grade 2, ER 90%, PR 100%, Ki-67 2%, HER-2 negative, T1CN0 stage I a AJCC 8  Pathology and radiology counseling: Discussed with the patient, the details of pathology including the type of breast cancer,the clinical staging, the significance of ER, PR and HER-2/neu receptors and the implications for treatment. After reviewing the pathology in detail, we proceeded to discuss the different treatment options between surgery, radiation, chemotherapy, antiestrogen therapies.  Treatment plan: 1.  Breast MRI 2. Genetics 3 Mammaprint testing to determine if she would benefit from chemo.  4.  Adjuvant radiation followed by 5.  Adjuvant antiestrogen therapy  ---------------------------------------------------------------------------------------------------------------- Mammaprint counseling: MINDACT is a prospective, randomized phase III controlled trial that investigates the clinical utility of MammaPrint, when compared to standard clinical pathological criteria, with 6,693 patients enrolled from over 111 institutions. Clinical high-risk patients with a Low Risk MammaPrint result, including 48% node-positive, had 5-year distant metastasis-free survival rate in excess of 94 percent, whether randomized to receive adjuvant chemotherapy or not proving MammaPrint's ability to safely identify Low Risk patients.  Return to clinic after surgery to discuss the final pathology report.

## 2017-11-13 NOTE — Patient Instructions (Signed)

## 2017-11-13 NOTE — Progress Notes (Signed)
[facility]SULT NOTE  Patient Care Team: Berenice Primas as PCP - General (Nurse Practitioner) Chipper Herb, MD as Referring Physician (Family Medicine) Cristal Deer, DPM as Attending Physician (Podiatry)  CHIEF COMPLAINTS/PURPOSE OF CONSULTATION:  Newly diagnosed breast cancer  HISTORY OF PRESENTING ILLNESS:  Erica Higgins 46 y.o. female is here because of recent diagnosis of right breast cancer.  Patient felt a right breast lump for which was an epidermoid cyst.  Incidentally on the mammogram she was noted to have a right breast mass at 8 o'clock position.  It measured 1.6 cm.  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  She was presented this morning to the multidisciplinary tumor board and she is here at the [facility] clinic to discuss her treatment plan.  She came in accompanied by her friend.  I reviewed her records extensively and collaborated the history with the patient.  SUMMARY OF ONCOLOGIC HISTORY:   Malignant neoplasm of lower-outer quadrant of right breast of female, estrogen receptor positive ([place])   11/06/2017 Initial Diagnosis    Palpable right breast mass which on radiologic evaluation was epidermoid cyst; incidentally found additional right breast mass at 8 o'clock position: 1.6 cm: IDC grade 2, ER 90%, PR 100%, Ki-67 2%, HER-2 negative, T1CN0 stage I a AJCC 8      MEDICAL HISTORY:  Past Medical History:  Diagnosis Date  . Abdominal pain, acute, right upper quadrant   . Anxiety   . Back pain   . Depression   . Joint pain   . Migraine   . Obesity   . Pain    Toes on bilateral feet  . Polycystic ovarian disease     SURGICAL HISTORY: Past Surgical History:  Procedure Laterality Date  . CESAREAN SECTION    . MINISCUS REPAIR  2003 and 2005  . TONSILLECTOMY  1996    SOCIAL HISTORY: Social History   Socioeconomic History  . Marital status: Single    Spouse name:  Not on file  . Number of children: 2  . Years of education: Bachelors  . Highest education level: Not on file  Social Needs  . Financial resource strain: Not on file  . Food insecurity - worry: Not on file  . Food insecurity - inability: Not on file  . Transportation needs - medical: Not on file  . Transportation needs - non-medical: Not on file  Occupational History  . Occupation: [employer] - [employer]  Tobacco Use  . Smoking status: Former Smoker    Packs/day: 0.30    Years: 2.00    Pack years: 0.60    Types: Cigarettes    Last attempt to quit: 09/03/1992    Years since quitting: 25.2  . Smokeless tobacco: Never Used  Substance and Sexual Activity  . Alcohol use: Yes    Alcohol/week: 0.0 oz  . Drug use: No  . Sexual activity: Not on file  Other Topics Concern  . Not on file  Social History Narrative   Lives at home with her two children (twins).   Right-handed.   2 cups caffeine per day.    FAMILY HISTORY: Family History  Problem Relation Age of Onset  . Emphysema Father        smoker  . Allergies Father   . Hyperlipidemia Father   . Hypertension Father   . Allergies Mother   . Hypertension Mother   .  Rheum arthritis Mother   . Osteoporosis Mother   . Skin cancer Mother   . Heart attack Paternal Grandfather   . Colon cancer Paternal Aunt   . Breast cancer Maternal Grandmother        Great Grandmother    ALLERGIES:  is allergic to ivp dye [iodinated diagnostic agents]; latex; penicillins; and shellfish allergy.  MEDICATIONS:  Current Outpatient Medications  Medication Sig Dispense Refill  . diazepam (VALIUM) 2 MG tablet TAKE ONE TABLET BY MOUTH EVERY TWELVE HOURS AS NEEDED  2  . ibuprofen (ADVIL,MOTRIN) 800 MG tablet Take 800 mg by mouth every 8 (eight) hours as needed.    . zolmitriptan (ZOMIG-ZMT) 5 MG disintegrating tablet Take 5 mg by mouth as needed.     Marland Kitchen FLUoxetine (PROZAC) 20 MG tablet Take 20 mg by mouth daily.    Marland Kitchen gabapentin (NEURONTIN) 300  MG capsule Take 2 capsules (600 mg total) by mouth 3 (three) times daily. (Patient not taking: Reported on 11/13/2017) 60 capsule 11   No current facility-administered medications for this visit.     REVIEW OF SYSTEMS:   Constitutional: Denies fevers, chills or abnormal night sweats Eyes: Denies blurriness of vision, double vision or watery eyes Ears, nose, mouth, throat, and face: Denies mucositis or sore throat Respiratory: Denies cough, dyspnea or wheezes Cardiovascular: Denies palpitation, chest discomfort or lower extremity swelling Gastrointestinal:  Denies nausea, heartburn or change in bowel habits Skin: Denies abnormal skin rashes Lymphatics: Denies new lymphadenopathy or easy bruising Neurological:Denies numbness, tingling or new weaknesses Behavioral/Psych: Mood is stable, no new changes  Breast:  Denies any palpable lumps or discharge All other systems were reviewed with the patient and are negative.  PHYSICAL EXAMINATION: ECOG PERFORMANCE STATUS: 1 - Symptomatic but completely ambulatory  Vitals:   11/13/17 0858  BP: (!) 115/46  Pulse: 83  Resp: 17  Temp: 98 F (36.7 C)  SpO2: 100%   Filed Weights   11/13/17 0858  Weight: 263 lb 11.2 oz (119.6 kg)    GENERAL:alert, no distress and comfortable SKIN: skin color, texture, turgor are normal, no rashes or significant lesions EYES: normal, conjunctiva are pink and non-injected, sclera clear OROPHARYNX:no exudate, no erythema and lips, buccal mucosa, and tongue normal  NECK: supple, thyroid normal size, non-tender, without nodularity LYMPH:  no palpable lymphadenopathy in the cervical, axillary or inguinal LUNGS: clear to auscultation and percussion with normal breathing effort HEART: regular rate & rhythm and no murmurs and no lower extremity edema ABDOMEN:abdomen soft, non-tender and normal bowel sounds Musculoskeletal:no cyanosis of digits and no clubbing  PSYCH: alert & oriented x 3 with fluent speech NEURO: no  focal motor/sensory deficits BREAST: No palpable nodules in breast. No palpable axillary or supraclavicular lymphadenopathy (exam performed in the presence of a chaperone)   LABORATORY DATA:  I have reviewed the data as listed Lab Results  Component Value Date   WBC 8.4 11/13/2017   HGB 13.3 02/29/2016   HCT 41.5 11/13/2017   MCV 87.5 11/13/2017   PLT 295 11/13/2017   Lab Results  Component Value Date   NA 139 11/13/2017   K 3.8 11/13/2017   CL 104 11/13/2017   CO2 29 11/13/2017    RADIOGRAPHIC STUDIES: I have personally reviewed the radiological reports and agreed with the findings in the report.  ASSESSMENT AND PLAN:  Malignant neoplasm of lower-outer quadrant of right breast of female, estrogen receptor positive ([place]) 11/06/2017: Palpable right breast mass which on radiologic evaluation was epidermoid cyst; incidentally  found additional right breast mass at 8 o'clock position: 1.6 cm: IDC grade 2, ER 90%, PR 100%, Ki-67 2%, HER-2 negative, T1CN0 stage I a AJCC 8  Pathology and radiology counseling: Discussed with the patient, the details of pathology including the type of breast cancer,the clinical staging, the significance of ER, PR and HER-2/neu receptors and the implications for treatment. After reviewing the pathology in detail, we proceeded to discuss the different treatment options between surgery, radiation, chemotherapy, antiestrogen therapies.  Treatment plan: 1.  Breast MRI 2. Genetics 3 Mammaprint testing to determine if she would benefit from chemo.  4.  Adjuvant radiation followed by 5.  Adjuvant antiestrogen therapy  ---------------------------------------------------------------------------------------------------------------- Mammaprint counseling: MINDACT is a prospective, randomized phase III controlled trial that investigates the clinical utility of MammaPrint, when compared to standard clinical pathological criteria, with 6,693 patients enrolled from over 111  institutions. Clinical high-risk patients with a Low Risk MammaPrint result, including 48% node-positive, had 5-year distant metastasis-free survival rate in excess of 94 percent, whether randomized to receive adjuvant chemotherapy or not proving MammaPrint's ability to safely identify Low Risk patients.  Return to clinic after surgery to discuss the final pathology report.    All questions were answered. The patient knows to call the clinic with any problems, questions or concerns.    Harriette Ohara, MD 11/13/17

## 2017-11-14 ENCOUNTER — Encounter: Payer: Self-pay | Admitting: Genetics

## 2017-11-14 ENCOUNTER — Encounter: Payer: Self-pay | Admitting: Radiation Oncology

## 2017-11-14 ENCOUNTER — Inpatient Hospital Stay (HOSPITAL_BASED_OUTPATIENT_CLINIC_OR_DEPARTMENT_OTHER): Admitting: Genetics

## 2017-11-14 DIAGNOSIS — Z803 Family history of malignant neoplasm of breast: Secondary | ICD-10-CM | POA: Diagnosis not present

## 2017-11-14 DIAGNOSIS — Z170 Estrogen receptor positive status [ER+]: Secondary | ICD-10-CM | POA: Diagnosis not present

## 2017-11-14 DIAGNOSIS — C50511 Malignant neoplasm of lower-outer quadrant of right female breast: Secondary | ICD-10-CM | POA: Diagnosis not present

## 2017-11-14 DIAGNOSIS — Z800 Family history of malignant neoplasm of digestive organs: Secondary | ICD-10-CM | POA: Diagnosis not present

## 2017-11-14 NOTE — Progress Notes (Addendum)
REFERRING PROVIDER: REFERRING PROVIDER: Nicholas Lose, MD [place], [place] [id number]  PRIMARY PROVIDER:  Nicanor Bake C  PRIMARY REASON FOR VISIT:  1. Malignant neoplasm of lower-outer quadrant of right breast of female, estrogen receptor positive ([place])   2. Family history of breast cancer   3. Family history of colon cancer   4. Family history of pancreatic cancer     HISTORY OF PRESENT ILLNESS:   [name], a 47 y.o. female, was seen for a [facility] cancer genetics consultation at the request of Dr. Lindi Adie due to a personal and family history of cancer.  [name] presents to clinic today to discuss the possibility of a hereditary predisposition to cancer, genetic testing, and to further clarify her future cancer risks, as well as potential cancer risks for family members.   On 11/06/2017, at the age of 53, [name] was diagnosed with invasive ductal carcinoma of the right breast, ER+, PR+, HER2-.  She will be getting a breast MRI, and has discussed brest conservation surgery and mastectomy with her surgeon, Dr. Brantley Stage.  Adjuvant radiation and antiestrogen therapy is planned.    CANCER HISTORY:    Malignant neoplasm of lower-outer quadrant of right breast of female, estrogen receptor positive ([place])   11/06/2017 Initial Diagnosis    Palpable right breast mass which on radiologic evaluation was epidermoid cyst; incidentally found additional right breast mass at 8 o'clock position: 1.6 cm: IDC grade 2, ER 90%, PR 100%, Ki-67 2%, HER-2 negative, T1CN0 stage I a AJCC 8        HORMONAL RISK FACTORS:  Menarche was at age 49.  First live birth at age 7.  Ovaries intact: yes.  Hysterectomy: no.  HRT use: 0 years. Colonoscopy: yes; 2011- reportedly normal. Mammogram within the last year: yes. Number of breast biopsies: 0.  Reports some aspirations that were always benign. Started breast screening in 20's due to cystic breasts.  Up to date with pelvic  exams:  Yes.  Reports she had one last week that was normal.   Past Medical History:  Diagnosis Date  . Abdominal pain, acute, right upper quadrant   . Anxiety   . Back pain   . Depression   . Family history of breast cancer   . Family history of colon cancer   . Family history of pancreatic cancer   . Joint pain   . Migraine   . Obesity   . Pain    Toes on bilateral feet  . Polycystic ovarian disease     Past Surgical History:  Procedure Laterality Date  . CESAREAN SECTION    . MINISCUS REPAIR  2003 and 2005  . TONSILLECTOMY  1996    Social History   Socioeconomic History  . Marital status: Single    Spouse name: None  . Number of children: 2  . Years of education: Bachelors  . Highest education level: None  Social Needs  . Financial resource strain: None  . Food insecurity - worry: None  . Food insecurity - inability: None  . Transportation needs - medical: None  . Transportation needs - non-medical: None  Occupational History  . Occupation: [employer] - R[employer]  Tobacco Use  . Smoking status: Former Smoker    Packs/day: 0.30    Years: 2.00    Pack years: 0.60    Types: Cigarettes    Last attempt to quit: 09/03/1992    Years since quitting: 25.2  . Smokeless tobacco:  Never Used  Substance and Sexual Activity  . Alcohol use: Yes    Alcohol/week: 0.0 oz  . Drug use: No  . Sexual activity: None  Other Topics Concern  . None  Social History Narrative   Lives at home with her two children (twins).   Right-handed.   2 cups caffeine per day.     FAMILY HISTORY:  We obtained a detailed, 4-generation family history.  Significant diagnoses are listed below: Family History  Problem Relation Age of Onset  . Emphysema Father        smoker  . Allergies Father   . Hyperlipidemia Father   . Hypertension Father   . Allergies Mother   . Hypertension Mother   . Rheum arthritis Mother   . Osteoporosis Mother   . Skin cancer Mother   . Breast cancer Other    . Heart attack Paternal Grandfather 63  . Colon cancer Paternal Aunt        dx 50's/60's  . Pancreatic cancer Maternal Grandmother 42  . Colon cancer Paternal Uncle 110  . Skin cancer Paternal Grandmother   . Breast cancer Cousin        dx 30's/40's  . Lung cancer Paternal Uncle    Ms. Shiflet has twins, a boy and a girl, who are 54.  Ms. Tweed has no siblings.   Ms. Pippins father is 44 with no history of cancer.  Ms. Schamp has 3 paternal uncles and 1 paternal aunt: -1 paternal aunt diagnosed with colon cancer in her 11's/60's and is currently in her 16's.  She had 2 sons and a daughter with no history of cancer.  -1 paternal uncle currently in his 82's and has colon cancer diagnosed in his 34's.  He had a son and 3 daughters.  One of these daughters (pateint's cousin) was diagnosed with breast cancer in her 87's/40's.  -1 paternal uncle with lung cancer and is in his 10's.   -1 paternal cousin with no history of cancer currently in his 62's.  No other paternal cousins with any history of cancer.  Ms. Moroni paternal grandfather died in his 38's due to a heart attack.  Ms. Krell paternal grandmother died in her 39's and had 1 skin cancer at an 'older age-type unk.   Ms. Spadafora mother is 71 with no history of cancer.  She has her uterus and ovaries intact.  Ms. Borre has a maternal aunt and a maternal uncle in their 62's with no history of cancer.  No maternal cousins with any history of cancer. Ms. Junio maternal grandfather died in his 42's, had a history of alcohol abuse, unk if he ever had cancer.  Ms. Ruddell maternal grandmother died of pancreatic cancer in her 26's.  This grandmother's mother (pateint's great grandmother) and her sister (patient's [place] aunt) both had breast cancer.   Ms. Frankson is unaware of previous family history of genetic testing for hereditary cancer risks. Patient's maternal ancestors are of Caucasian descent, and paternal ancestors are of Caucasian descent. There  is no reported Ashkenazi Jewish ancestry. There is no known consanguinity.  GENETIC COUNSELING ASSESSMENT: Kenishia Onstad is a 47 y.o. female with a personal and family history which is somewhat suggestive of a Hereditary Cancer Predisposition Syndrome. We, therefore, discussed and recommended the following at today's visit.   DISCUSSION: We reviewed the characteristics, features and inheritance patterns of hereditary cancer syndromes. We also discussed genetic testing, including the appropriate family members to test, the process  of testing, insurance coverage and turn-around-time for results. We discussed the implications of a negative, positive and/or variant of uncertain significant result. In order to get genetic test results in a timely manner so that Ms. Hammad can use these genetic test results for surgical decisions, we recommended Ms. Belarus pursue genetic testing for the Breast  Cancer STAT Panel. The STAT Breast cancer panel offered by [facility] includes sequencing and rearrangement analysis for the following 9 genes:  ATM, BRCA1, BRCA2, CDH1, CHEK2, PALB2, PTEN, STK11 and TP53.   We then recommend Ms. Suderman pursue reflex genetic testing to the Common Hereditary Cancers gene panel. The Common Hereditary Cancer Panel offered by [facility] includes sequencing and/or deletion duplication testing of the following 47 genes: APC, ATM, AXIN2, BARD1, BMPR1A, BRCA1, BRCA2, BRIP1, CDH1, CDKN2A (p14ARF), CDKN2A (p16INK4a), CKD4, CHEK2, CTNNA1, DICER1, EPCAM (Deletion/duplication testing only), GREM1 (promoter region deletion/duplication testing only), KIT, MEN1, MLH1, MSH2, MSH3, MSH6, MUTYH, NBN, NF1, NHTL1, PALB2, PDGFRA, PMS2, POLD1, POLE, PTEN, RAD50, RAD51C, RAD51D, SDHB, SDHC, SDHD, SMAD4, SMARCA4. STK11, TP53, TSC1, TSC2, and VHL.  The following genes were evaluated for sequence changes only: SDHA and HOXB13 c.251G>A variant only.  We discussed that only 5-10% of cancers are associated with a Hereditary cancer  predisposition syndrome.  One of the most common hereditary cancer syndromes that increases breast cancer risk is called Hereditary Breast and Ovarian Cancer (HBOC) syndrome.  This syndrome is caused by mutations in the BRCA1 and BRCA2 genes.  This syndrome increases an individual's lifetime risk to develop breast, ovarian, pancreatic, and other types of cancer.  There are also many other cancer predisposition syndromes caused by mutations in several other genes.  We discussed that if she is found to have a mutation in one of these genes, it may impact surgical decisions, and alter future medical management recommendations such as increased cancer screenings and consideration of risk reducing surgeries.  A positive result could also have implications for the patient's family members.  A Negative result would mean we were unable to identify a hereditary component to her cancer, but does not rule out the possibility of a hereditary basis for her cancer.  There could be mutations that are undetectable by current technology, or in genes not yet tested or identified to increase cancer risk.    We discussed the potential to find a Variant of Uncertain Significance or VUS.  These are variants that have not yet been identified as pathogenic or benign, and it is unknown if this variant is associated with increased cancer risk or if this is a normal finding.  Most VUS's are reclassified to benign or likely benign.   It should not be used to make medical management decisions. With time, we suspect the lab will determine the significance of any VUS's identified if any.   Based on Ms. Droz's personal and family history of cancer, she meets medical criteria for genetic testing. Despite that she meets criteria, she may still have an out of pocket cost. We discussed that if her out of pocket cost for testing is over $100, the laboratory will call and confirm whether she wants to proceed with testing.  If the out of pocket  cost of testing is less than $100 she will be billed by the genetic testing laboratory.    PLAN: After considering the risks, benefits, and limitations, Ms. Vazques  provided informed consent to pursue genetic testing and the blood sample was sent to [facility] for analysis of the Breast Cancer STAT  Panel with plans to reflex to the Common Hereditary Cancers Panel. Results should be available within approximately 2-3 weeks' time, at which point they will be disclosed by telephone to Ms. Poynor, as will any additional recommendations warranted by these results. Ms. Normington will receive a summary of her genetic counseling visit and a copy of her results once available. This information will also be available in Epic. We encouraged Ms. Apt to remain in contact with cancer genetics annually so that we can continuously update the family history and inform her of any changes in cancer genetics and testing that may be of benefit for her family. Ms. Jr questions were answered to her satisfaction today. Our contact information was provided should additional questions or concerns arise.  Based on Ms. Mancino's family history, we recommended her maternal and paternal relatives, especially those affected with cancer, discuss have genetic counseling and testing with their physcians. Ms. Deike will let us know if we can be of any assistance in coordinating genetic counseling and/or testing for this family member.   Lastly, we encouraged Ms. Murgia to remain in contact with cancer genetics annually so that we can continuously update the family history and inform her of any changes in cancer genetics and testing that may be of benefit for this family.   Ms.  Vigorito questions were answered to her satisfaction today. Our contact information was provided should additional questions or concerns arise. Thank you for the referral and allowing [place] to share in the care of your patient.   Tana Felts, MS, [facility] Certified  Genetic Counselor [email or web address] phone: [phone]  The patient was seen for a total of 40 minutes in face-to-face genetic counseling. This patient was discussed with Drs. Magrinat, Lindi Adie and/or Burr Medico who agrees with the above.

## 2017-11-18 ENCOUNTER — Ambulatory Visit (HOSPITAL_COMMUNITY)
Admission: RE | Admit: 2017-11-18 | Discharge: 2017-11-18 | Disposition: A | Discharge status: Home / Self Care | Source: Ambulatory Visit | Attending: Surgery | Admitting: Surgery

## 2017-11-18 DIAGNOSIS — Z170 Estrogen receptor positive status [ER+]: Principal | ICD-10-CM

## 2017-11-18 DIAGNOSIS — C50511 Malignant neoplasm of lower-outer quadrant of right female breast: Secondary | ICD-10-CM

## 2017-11-19 ENCOUNTER — Telehealth: Payer: Self-pay | Admitting: *Deleted

## 2017-11-19 ENCOUNTER — Ambulatory Visit (HOSPITAL_COMMUNITY): Admission: RE | Admit: 2017-11-19 | Source: Ambulatory Visit

## 2017-11-19 ENCOUNTER — Other Ambulatory Visit: Payer: Self-pay | Admitting: *Deleted

## 2017-11-19 DIAGNOSIS — C50511 Malignant neoplasm of lower-outer quadrant of right female breast: Secondary | ICD-10-CM

## 2017-11-19 DIAGNOSIS — Z170 Estrogen receptor positive status [ER+]: Principal | ICD-10-CM

## 2017-11-19 NOTE — Telephone Encounter (Signed)
Spoke to pt concerning [place] from 3.13.19. Denies questions or regarding dx or treatment care plan. Pt relate she had difficulty with breast MRI at [facility]. Informed pt that I ordered new breast MRI at GI and she will receive a call with new MRI date. Received verbal understanding.

## 2017-11-22 ENCOUNTER — Other Ambulatory Visit: Payer: Self-pay | Admitting: Surgery

## 2017-11-22 ENCOUNTER — Inpatient Hospital Stay: Admission: RE | Admit: 2017-11-22 | Source: Ambulatory Visit

## 2017-11-22 DIAGNOSIS — C50911 Malignant neoplasm of unspecified site of right female breast: Secondary | ICD-10-CM

## 2017-11-22 DIAGNOSIS — Z170 Estrogen receptor positive status [ER+]: Principal | ICD-10-CM

## 2017-11-23 ENCOUNTER — Ambulatory Visit
Admission: RE | Admit: 2017-11-23 | Discharge: 2017-11-23 | Disposition: A | Discharge status: Home / Self Care | Source: Ambulatory Visit | Attending: Surgery | Admitting: Surgery

## 2017-11-23 DIAGNOSIS — Z170 Estrogen receptor positive status [ER+]: Principal | ICD-10-CM

## 2017-11-23 DIAGNOSIS — C50511 Malignant neoplasm of lower-outer quadrant of right female breast: Secondary | ICD-10-CM

## 2017-11-23 MED ORDER — GADOBENATE DIMEGLUMINE 529 MG/ML IV SOLN
20.0000 mL | Freq: Once | INTRAVENOUS | Status: AC | PRN
  Administered 2017-11-23: 20 mL via INTRAVENOUS

## 2017-11-25 ENCOUNTER — Telehealth: Payer: Self-pay | Admitting: Genetics

## 2017-11-25 NOTE — Telephone Encounter (Signed)
Prelimiary genetic test results- Revealed negative genetic testing.    These are the 9 high risk breast cancer genes that have recommendations that may impact a woman's breast surgical choice.  No genetic information that would alter her upcoming breast surgical decision.    We will still have to wait to get her full panel results.  I will call her when those are available.

## 2017-11-26 ENCOUNTER — Other Ambulatory Visit: Payer: Self-pay | Admitting: Surgery

## 2017-11-26 DIAGNOSIS — R928 Other abnormal and inconclusive findings on diagnostic imaging of breast: Secondary | ICD-10-CM

## 2017-11-27 ENCOUNTER — Other Ambulatory Visit

## 2017-11-28 ENCOUNTER — Ambulatory Visit
Admission: RE | Admit: 2017-11-28 | Discharge: 2017-11-28 | Disposition: A | Discharge status: Home / Self Care | Source: Ambulatory Visit | Attending: Surgery | Admitting: Surgery

## 2017-11-28 ENCOUNTER — Other Ambulatory Visit: Payer: Self-pay | Admitting: Surgery

## 2017-11-28 ENCOUNTER — Encounter (HOSPITAL_BASED_OUTPATIENT_CLINIC_OR_DEPARTMENT_OTHER): Payer: Self-pay | Admitting: *Deleted

## 2017-11-28 DIAGNOSIS — R599 Enlarged lymph nodes, unspecified: Secondary | ICD-10-CM

## 2017-11-28 DIAGNOSIS — R928 Other abnormal and inconclusive findings on diagnostic imaging of breast: Secondary | ICD-10-CM

## 2017-11-29 ENCOUNTER — Encounter (HOSPITAL_BASED_OUTPATIENT_CLINIC_OR_DEPARTMENT_OTHER)
Admission: RE | Admit: 2017-11-29 | Discharge: 2017-11-29 | Disposition: A | Discharge status: Home / Self Care | Source: Ambulatory Visit | Attending: Surgery | Admitting: Surgery

## 2017-11-29 NOTE — Progress Notes (Signed)
Anesthesia consult per Dr. Smith Robert, need sleep study report from [place].  Called Scott at [facility]- stated he would fax the sleep study.  Called a second time, Sherrie stated they would fax the sleep study but it may be Monday.  Ensure pre surgery drink given with instructions, surgical soap given with instructions, pt verbalized understanding.

## 2017-12-02 ENCOUNTER — Ambulatory Visit: Payer: Self-pay | Admitting: Surgery

## 2017-12-02 ENCOUNTER — Other Ambulatory Visit: Payer: Self-pay | Admitting: Surgery

## 2017-12-02 ENCOUNTER — Telehealth: Payer: Self-pay | Admitting: Genetics

## 2017-12-02 DIAGNOSIS — C50911 Malignant neoplasm of unspecified site of right female breast: Secondary | ICD-10-CM

## 2017-12-02 HISTORY — PX: MASTECTOMY: SHX3

## 2017-12-02 NOTE — H&P (View-Only) (Signed)
Erica Higgins Documented: 12/02/2017 2:10 PM Location: [facility] Patient #: [id number] DOB: 12/30/1970 Single / Language: English / Race: White Female  History of Present Illness (Erica Higgins A. Erica Lozada MD; 12/02/2017 2:40 PM) Patient words: Patient returns for follow-up of her right breast cancer. MRI was done which shows treated for coronary disease. Additional biopsies showed additional invasive ductal carcinoma grade 1 year positive. A positive HER-2/neu negative. Of note she has 1 positive aspirin notices been clipped. She was scheduled for lumpectomy prior to her MRI but given the amount of disease she is now a mastectomy candidate. I discussed the case with Dr. Godina of oncology and he did not feel there was used for neoadjuvant chemotherapy given the 4 quadrant disease and the fact she will need a mastectomy anyway. She is here today for follow-up.  The patient is a 47 year old female.   Allergies (Erica Higgins, CMA; 12/02/2017 2:12 PM) Latex Penicillins Adhesive 1"x6yd *MEDICAL DEVICES AND SUPPLIES* Iodinated Contrast Media Allergies Reconciled  Medication History (Erica Higgins, CMA; 12/02/2017 2:13 PM) DiazePAM (2MG Tablet, Oral) Active. PROzac (20MG Capsule, Oral) Active. ZOLMitriptan (5MG Tablet, Oral) Active. Medications Reconciled    Vitals (Erica Higgins CMA; 12/02/2017 2:13 PM) 12/02/2017 2:13 PM Weight: 263.13 lb Height: 63in Body Surface Area: 2.17 m Body Mass Index: 46.61 kg/m  Temp.: 98.3F(Oral)  Pulse: 82 (Regular)  BP: 116/72 (Sitting, Right Arm, Large)      Physical Exam (Erica Higgins A. Erica Santo MD; 12/02/2017 2:41 PM)  General Mental Status-Alert. General Appearance-Consistent with stated age. Hydration-Well hydrated. Voice-Normal.  Breast Note: no change    Assessment & Plan (Erica Higgins A. Erica Ghan MD; 12/02/2017 2:42 PM)  BREAST CANCER, RIGHT (C50.911) Impression: Given the patient's amount disease  mastectomy on the right recommended. She does not desire reconstruction. Discussed treatment options for breast cancer to include breast conservation vs mastectomy with reconstruction. Pt has decided on mastectomy. Risk include bleeding, infection, flap necrosis, pain, numbness, recurrence, hematoma, other surgery needs. Pt understands and agrees to proceed.  Sentinel lymph node mapping and targeted right axillary biopsy using a wire given her iodine allergy. Risk of sentinel lymph node mapping include bleeding, infection, lymphedema, shoulder pain. stiffness, dye allergy. cosmetic deformity , blood clots, death, need for more surgery. Pt agres to proceed. 

## 2017-12-02 NOTE — H&P (Signed)
Erica Higgins Documented: 12/02/2017 2:10 PM Location: [facility] Patient #: [id number] DOB: 19-Apr-1971 Single / Language: Erica Higgins / Race: White Female  History of Present Illness Erica Higgins A. Erica Stierwalt MD; 12/02/2017 2:40 PM) Patient words: Patient returns for follow-up of her right breast cancer. MRI was done which shows treated for coronary disease. Additional biopsies showed additional invasive ductal carcinoma grade 1 year positive. A positive HER-2/neu negative. Of note she has 1 positive aspirin notices been clipped. She was scheduled for lumpectomy prior to her MRI but given the amount of disease she is now a mastectomy candidate. I discussed the case with Dr. Payton Higgins of oncology and he did not feel there was used for neoadjuvant chemotherapy given the 4 quadrant disease and the fact she will need a mastectomy anyway. She is here today for follow-up.  The patient is a 47 year old female.   Allergies (Erica Higgins, CMA; 12/02/2017 2:12 PM) Latex Penicillins Adhesive 1"x6yd *MEDICAL DEVICES AND SUPPLIES* Iodinated Contrast Media Allergies Reconciled  Medication History (Erica Higgins, CMA; 12/02/2017 2:13 PM) DiazePAM (2MG Tablet, Oral) Active. PROzac (20MG Capsule, Oral) Active. ZOLMitriptan (5MG Tablet, Oral) Active. Medications Reconciled    Vitals (Erica Higgins CMA; 12/02/2017 2:13 PM) 12/02/2017 2:13 PM Weight: 263.13 lb Height: 63in Body Surface Area: 2.17 m Body Mass Index: 46.61 kg/m  Temp.: 98.57F(Oral)  Pulse: 82 (Regular)  BP: 116/72 (Sitting, Right Arm, Large)      Physical Exam (Erica Higgins A. Erica Berke MD; 12/02/2017 2:41 PM)  General Mental Status-Alert. General Appearance-Consistent with stated age. Hydration-Well hydrated. Voice-Normal.  Breast Note: no change    Assessment & Plan (Erica Higgins A. Erica Mutz MD; 12/02/2017 2:42 PM)  BREAST CANCER, RIGHT (C50.911) Impression: Given the patient's amount disease  mastectomy on the right recommended. She does not desire reconstruction. Discussed treatment options for breast cancer to include breast conservation vs mastectomy with reconstruction. Pt has decided on mastectomy. Risk include bleeding, infection, flap necrosis, pain, numbness, recurrence, hematoma, other surgery needs. Pt understands and agrees to proceed.  Sentinel lymph node mapping and targeted right axillary biopsy using a wire given her iodine allergy. Risk of sentinel lymph node mapping include bleeding, infection, lymphedema, shoulder pain. stiffness, dye allergy. cosmetic deformity , blood clots, death, need for more surgery. Pt agres to proceed.

## 2017-12-02 NOTE — Telephone Encounter (Signed)
Revealed negative genetic testing (full panel).     This normal result is reassuring and indicates that it is unlikely Erica Higgins's cancer is due to a hereditary cause.  It is unlikely that there is an increased risk of another cancer due to a mutation in one of these genes.  However, genetic testing is not perfect, and cannot definitively rule out a hereditary cause.  It will be important for her to keep in contact with genetics to learn if any additional testing may be needed in the future.   Recommended relatives on mother and father's side of family have genetic testing.  Father colonoscopy every 5 years, all relatives should tell their doctors about the family history of cancer so they can make the best individual screening recommendations.  Her daughter should start mammograms by age 27.

## 2017-12-04 ENCOUNTER — Encounter

## 2017-12-05 ENCOUNTER — Ambulatory Visit (HOSPITAL_BASED_OUTPATIENT_CLINIC_OR_DEPARTMENT_OTHER): Admitting: Anesthesiology

## 2017-12-05 ENCOUNTER — Ambulatory Visit
Admission: RE | Admit: 2017-12-05 | Discharge: 2017-12-05 | Disposition: A | Discharge status: Home / Self Care | Source: Ambulatory Visit | Attending: Surgery | Admitting: Surgery

## 2017-12-05 ENCOUNTER — Ambulatory Visit (HOSPITAL_COMMUNITY)

## 2017-12-05 ENCOUNTER — Encounter

## 2017-12-05 ENCOUNTER — Encounter (HOSPITAL_BASED_OUTPATIENT_CLINIC_OR_DEPARTMENT_OTHER)
Admission: RE | Disposition: A | Discharge status: Home / Self Care | Payer: Self-pay | Source: Ambulatory Visit | Attending: Surgery

## 2017-12-05 ENCOUNTER — Other Ambulatory Visit: Payer: Self-pay | Admitting: Surgery

## 2017-12-05 ENCOUNTER — Ambulatory Visit (HOSPITAL_BASED_OUTPATIENT_CLINIC_OR_DEPARTMENT_OTHER)
Admission: RE | Admit: 2017-12-05 | Discharge: 2017-12-06 | Disposition: A | Discharge status: Home / Self Care | Source: Ambulatory Visit | Attending: Surgery | Admitting: Surgery

## 2017-12-05 ENCOUNTER — Encounter (HOSPITAL_BASED_OUTPATIENT_CLINIC_OR_DEPARTMENT_OTHER): Payer: Self-pay | Admitting: Anesthesiology

## 2017-12-05 ENCOUNTER — Ambulatory Visit (HOSPITAL_COMMUNITY)
Admission: RE | Admit: 2017-12-05 | Discharge: 2017-12-05 | Disposition: A | Discharge status: Home / Self Care | Source: Ambulatory Visit | Attending: Surgery | Admitting: Surgery

## 2017-12-05 DIAGNOSIS — Z79899 Other long term (current) drug therapy: Secondary | ICD-10-CM | POA: Diagnosis not present

## 2017-12-05 DIAGNOSIS — C50911 Malignant neoplasm of unspecified site of right female breast: Secondary | ICD-10-CM

## 2017-12-05 DIAGNOSIS — L720 Epidermal cyst: Secondary | ICD-10-CM | POA: Insufficient documentation

## 2017-12-05 DIAGNOSIS — G4730 Sleep apnea, unspecified: Secondary | ICD-10-CM | POA: Insufficient documentation

## 2017-12-05 DIAGNOSIS — C773 Secondary and unspecified malignant neoplasm of axilla and upper limb lymph nodes: Secondary | ICD-10-CM | POA: Insufficient documentation

## 2017-12-05 DIAGNOSIS — Z87891 Personal history of nicotine dependence: Secondary | ICD-10-CM | POA: Insufficient documentation

## 2017-12-05 DIAGNOSIS — Z170 Estrogen receptor positive status [ER+]: Principal | ICD-10-CM

## 2017-12-05 DIAGNOSIS — F329 Major depressive disorder, single episode, unspecified: Secondary | ICD-10-CM | POA: Insufficient documentation

## 2017-12-05 DIAGNOSIS — F419 Anxiety disorder, unspecified: Secondary | ICD-10-CM | POA: Diagnosis not present

## 2017-12-05 DIAGNOSIS — C50811 Malignant neoplasm of overlapping sites of right female breast: Secondary | ICD-10-CM | POA: Insufficient documentation

## 2017-12-05 HISTORY — DX: Sleep apnea, unspecified: G47.30

## 2017-12-05 HISTORY — PX: SIMPLE MASTECTOMY WITH AXILLARY SENTINEL NODE BIOPSY: SHX6098

## 2017-12-05 SURGERY — BREAST LUMPECTOMY WITH NEEDLE LOCALIZATION AND AXILLARY SENTINEL LYMPH NODE BX
Anesthesia: General | Laterality: Right

## 2017-12-05 SURGERY — SIMPLE MASTECTOMY WITH AXILLARY SENTINEL NODE BIOPSY
Anesthesia: General | Site: Breast | Laterality: Right

## 2017-12-05 MED ORDER — ACETAMINOPHEN 650 MG RE SUPP: 650.0000 mg | RECTAL | Status: DC | PRN

## 2017-12-05 MED ORDER — ACETAMINOPHEN 500 MG PO TABS
ORAL_TABLET | ORAL | Status: AC
  Filled 2017-12-05: qty 2

## 2017-12-05 MED ORDER — PROPOFOL 10 MG/ML IV BOLUS
INTRAVENOUS | Status: AC
  Filled 2017-12-05: qty 20

## 2017-12-05 MED ORDER — MIDAZOLAM HCL 2 MG/2ML IJ SOLN
INTRAMUSCULAR | Status: AC
  Filled 2017-12-05: qty 2

## 2017-12-05 MED ORDER — FENTANYL CITRATE (PF) 100 MCG/2ML IJ SOLN
50.0000 ug | INTRAMUSCULAR | Status: AC | PRN
  Administered 2017-12-05: 25 ug via INTRAVENOUS
  Administered 2017-12-05: 50 ug via INTRAVENOUS
  Administered 2017-12-05 (×3): 25 ug via INTRAVENOUS
  Administered 2017-12-05: 50 ug via INTRAVENOUS
  Administered 2017-12-05: 100 ug via INTRAVENOUS

## 2017-12-05 MED ORDER — LIDOCAINE HCL (CARDIAC) 20 MG/ML IV SOLN
INTRAVENOUS | Status: AC
  Filled 2017-12-05: qty 5

## 2017-12-05 MED ORDER — PHENYLEPHRINE 40 MCG/ML (10ML) SYRINGE FOR IV PUSH (FOR BLOOD PRESSURE SUPPORT)
PREFILLED_SYRINGE | INTRAVENOUS | Status: AC
  Filled 2017-12-05: qty 10

## 2017-12-05 MED ORDER — ACETAMINOPHEN 325 MG PO TABS
650.0000 mg | ORAL_TABLET | ORAL | Status: DC | PRN
  Administered 2017-12-05: 650 mg via ORAL
  Filled 2017-12-05 (×2): qty 2

## 2017-12-05 MED ORDER — DEXAMETHASONE SODIUM PHOSPHATE 10 MG/ML IJ SOLN
INTRAMUSCULAR | Status: AC
  Filled 2017-12-05: qty 1

## 2017-12-05 MED ORDER — CIPROFLOXACIN IN D5W 400 MG/200ML IV SOLN
INTRAVENOUS | Status: AC
Start: 2017-12-05 — End: ?
  Filled 2017-12-05: qty 200

## 2017-12-05 MED ORDER — CLINDAMYCIN PHOSPHATE 900 MG/50ML IV SOLN: 900.0000 mg | INTRAVENOUS | Status: DC

## 2017-12-05 MED ORDER — CHLORHEXIDINE GLUCONATE CLOTH 2 % EX PADS: 6.0000 | MEDICATED_PAD | Freq: Once | CUTANEOUS | Status: DC

## 2017-12-05 MED ORDER — CIPROFLOXACIN IN D5W 400 MG/200ML IV SOLN
400.0000 mg | INTRAVENOUS | Status: AC
  Administered 2017-12-05: 400 mg via INTRAVENOUS

## 2017-12-05 MED ORDER — SODIUM CHLORIDE 0.9 % IV SOLN: 250.0000 mL | INTRAVENOUS | Status: DC | PRN

## 2017-12-05 MED ORDER — SODIUM CHLORIDE 0.9 % IJ SOLN
INTRAMUSCULAR | Status: AC
  Filled 2017-12-05: qty 10

## 2017-12-05 MED ORDER — OXYCODONE HCL 5 MG PO TABS
5.0000 mg | ORAL_TABLET | ORAL | Status: DC | PRN
  Administered 2017-12-05: 5 mg via ORAL
  Administered 2017-12-05: 10 mg via ORAL
  Administered 2017-12-06: 5 mg via ORAL
  Filled 2017-12-05: qty 2
  Filled 2017-12-05 (×3): qty 1

## 2017-12-05 MED ORDER — LORAZEPAM 1 MG PO TABS
1.0000 mg | ORAL_TABLET | Freq: Four times a day (QID) | ORAL | Status: DC | PRN
Start: 2017-12-05 — End: 2017-12-06

## 2017-12-05 MED ORDER — FENTANYL CITRATE (PF) 100 MCG/2ML IJ SOLN
INTRAMUSCULAR | Status: AC
  Filled 2017-12-05: qty 2

## 2017-12-05 MED ORDER — FENTANYL CITRATE (PF) 100 MCG/2ML IJ SOLN: 25.0000 ug | INTRAMUSCULAR | Status: DC | PRN

## 2017-12-05 MED ORDER — DEXAMETHASONE SODIUM PHOSPHATE 4 MG/ML IJ SOLN
INTRAMUSCULAR | Status: DC | PRN
  Administered 2017-12-05: 10 mg via INTRAVENOUS

## 2017-12-05 MED ORDER — ACETAMINOPHEN 500 MG PO TABS
1000.0000 mg | ORAL_TABLET | ORAL | Status: AC
  Administered 2017-12-05: 1000 mg via ORAL

## 2017-12-05 MED ORDER — DEXTROSE-NACL 5-0.45 % IV SOLN
INTRAVENOUS | Status: DC
  Administered 2017-12-05: 14:00:00 via INTRAVENOUS

## 2017-12-05 MED ORDER — MORPHINE SULFATE (PF) 2 MG/ML IV SOLN: 2.0000 mg | INTRAVENOUS | Status: DC | PRN

## 2017-12-05 MED ORDER — METHYLENE BLUE 0.5 % INJ SOLN
INTRAVENOUS | Status: AC
  Filled 2017-12-05: qty 10

## 2017-12-05 MED ORDER — PROMETHAZINE HCL 25 MG/ML IJ SOLN: 6.2500 mg | INTRAMUSCULAR | Status: DC | PRN

## 2017-12-05 MED ORDER — SODIUM CHLORIDE 0.9% FLUSH: 3.0000 mL | INTRAVENOUS | Status: DC | PRN

## 2017-12-05 MED ORDER — ENOXAPARIN SODIUM 40 MG/0.4ML ~~LOC~~ SOLN
40.0000 mg | SUBCUTANEOUS | Status: DC
  Administered 2017-12-06: 40 mg via SUBCUTANEOUS
  Filled 2017-12-05: qty 0.4

## 2017-12-05 MED ORDER — SODIUM CHLORIDE 0.9 % IJ SOLN
INTRAVENOUS | Status: DC | PRN
  Administered 2017-12-05: 5 mL via INTRAMUSCULAR

## 2017-12-05 MED ORDER — OXYCODONE HCL 5 MG PO TABS: 5.0000 mg | ORAL_TABLET | Freq: Four times a day (QID) | ORAL | 0 refills | Status: DC | PRN

## 2017-12-05 MED ORDER — LIDOCAINE 2% (20 MG/ML) 5 ML SYRINGE
INTRAMUSCULAR | Status: DC | PRN
  Administered 2017-12-05: 90 mg via INTRAVENOUS

## 2017-12-05 MED ORDER — LACTATED RINGERS IV SOLN
INTRAVENOUS | Status: DC
  Administered 2017-12-05 (×2): via INTRAVENOUS

## 2017-12-05 MED ORDER — BUPIVACAINE-EPINEPHRINE (PF) 0.5% -1:200000 IJ SOLN
INTRAMUSCULAR | Status: DC | PRN
  Administered 2017-12-05: 30 mL

## 2017-12-05 MED ORDER — SODIUM CHLORIDE 0.9% FLUSH: 3.0000 mL | Freq: Two times a day (BID) | INTRAVENOUS | Status: DC

## 2017-12-05 MED ORDER — PHENYLEPHRINE HCL 10 MG/ML IJ SOLN
INTRAMUSCULAR | Status: DC | PRN
  Administered 2017-12-05 (×2): 80 ug via INTRAVENOUS

## 2017-12-05 MED ORDER — ONDANSETRON HCL 4 MG/2ML IJ SOLN: 4.0000 mg | Freq: Four times a day (QID) | INTRAMUSCULAR | Status: DC | PRN

## 2017-12-05 MED ORDER — ONDANSETRON HCL 4 MG/2ML IJ SOLN
INTRAMUSCULAR | Status: AC
  Filled 2017-12-05: qty 2

## 2017-12-05 MED ORDER — ONDANSETRON 4 MG PO TBDP: 4.0000 mg | ORAL_TABLET | Freq: Four times a day (QID) | ORAL | Status: DC | PRN

## 2017-12-05 MED ORDER — IBUPROFEN 800 MG PO TABS: 800.0000 mg | ORAL_TABLET | Freq: Three times a day (TID) | ORAL | 0 refills | Status: DC | PRN

## 2017-12-05 MED ORDER — MIDAZOLAM HCL 2 MG/2ML IJ SOLN
1.0000 mg | Freq: Once | INTRAMUSCULAR | Status: AC
  Administered 2017-12-05: 1 mg via INTRAVENOUS

## 2017-12-05 MED ORDER — TECHNETIUM TC 99M SULFUR COLLOID FILTERED
1.0000 | Freq: Once | INTRAVENOUS | Status: AC | PRN
  Administered 2017-12-05: 1 via INTRADERMAL

## 2017-12-05 MED ORDER — MIDAZOLAM HCL 2 MG/2ML IJ SOLN
1.0000 mg | INTRAMUSCULAR | Status: DC | PRN
  Administered 2017-12-05 (×3): 2 mg via INTRAVENOUS

## 2017-12-05 MED ORDER — MORPHINE SULFATE (PF) 4 MG/ML IV SOLN
INTRAVENOUS | Status: AC
  Filled 2017-12-05: qty 1

## 2017-12-05 MED ORDER — SCOPOLAMINE 1 MG/3DAYS TD PT72: 1.0000 | MEDICATED_PATCH | Freq: Once | TRANSDERMAL | Status: DC | PRN

## 2017-12-05 MED ORDER — ONDANSETRON HCL 4 MG/2ML IJ SOLN
INTRAMUSCULAR | Status: DC | PRN
  Administered 2017-12-05: 4 mg via INTRAVENOUS

## 2017-12-05 MED ORDER — FENTANYL CITRATE (PF) 100 MCG/2ML IJ SOLN
50.0000 ug | Freq: Once | INTRAMUSCULAR | Status: AC
  Administered 2017-12-05: 50 ug via INTRAVENOUS

## 2017-12-05 MED ORDER — PROPOFOL 10 MG/ML IV BOLUS
INTRAVENOUS | Status: DC | PRN
  Administered 2017-12-05: 160 mg via INTRAVENOUS
  Administered 2017-12-05: 40 mg via INTRAVENOUS

## 2017-12-05 SURGICAL SUPPLY — 53 items
APPLIER CLIP 9.375 MED OPEN (MISCELLANEOUS) ×3
BINDER BREAST XXLRG (GAUZE/BANDAGES/DRESSINGS) ×3 IMPLANT
BIOPATCH RED 1 DISK 7.0 (GAUZE/BANDAGES/DRESSINGS) ×4 IMPLANT
BIOPATCH RED 1IN DISK 7.0MM (GAUZE/BANDAGES/DRESSINGS) ×2
BLADE HEX COATED 2.75 (ELECTRODE) ×3 IMPLANT
BLADE SURG 10 STRL SS (BLADE) ×3 IMPLANT
BLADE SURG 15 STRL LF DISP TIS (BLADE) ×1 IMPLANT
BLADE SURG 15 STRL SS (BLADE) ×2
CANISTER SUCT 1200ML W/VALVE (MISCELLANEOUS) ×3 IMPLANT
CHLORAPREP W/TINT 26ML (MISCELLANEOUS) ×3 IMPLANT
CLIP APPLIE 9.375 MED OPEN (MISCELLANEOUS) ×1 IMPLANT
COVER BACK TABLE 60X90IN (DRAPES) ×3 IMPLANT
COVER MAYO STAND STRL (DRAPES) ×3 IMPLANT
COVER PROBE W GEL 5X96 (DRAPES) ×3 IMPLANT
DECANTER SPIKE VIAL GLASS SM (MISCELLANEOUS) IMPLANT
DERMABOND ADVANCED (GAUZE/BANDAGES/DRESSINGS) ×8
DERMABOND ADVANCED .7 DNX12 (GAUZE/BANDAGES/DRESSINGS) ×4 IMPLANT
DEVICE DUBIN W/COMP PLATE 8390 (MISCELLANEOUS) ×3 IMPLANT
DRAIN CHANNEL 19F RND (DRAIN) ×6 IMPLANT
DRAPE LAPAROSCOPIC ABDOMINAL (DRAPES) ×3 IMPLANT
DRAPE UTILITY XL STRL (DRAPES) ×3 IMPLANT
ELECT COATED BLADE 2.86 ST (ELECTRODE) ×3 IMPLANT
ELECT REM PT RETURN 9FT ADLT (ELECTROSURGICAL) ×3
ELECTRODE REM PT RTRN 9FT ADLT (ELECTROSURGICAL) ×1 IMPLANT
EVACUATOR SILICONE 100CC (DRAIN) ×6 IMPLANT
GLOVE BIOGEL PI IND STRL 8 (GLOVE) ×1 IMPLANT
GLOVE BIOGEL PI INDICATOR 8 (GLOVE) ×2
GLOVE SURG SS PI 6.5 STRL IVOR (GLOVE) ×6 IMPLANT
GLOVE SURG SS PI 7.0 STRL IVOR (GLOVE) ×6 IMPLANT
GLOVE SURG SS PI 8.0 STRL IVOR (GLOVE) ×6 IMPLANT
GOWN STRL REUS W/ TWL LRG LVL3 (GOWN DISPOSABLE) ×4 IMPLANT
GOWN STRL REUS W/TWL LRG LVL3 (GOWN DISPOSABLE) ×8
HEMOSTAT ARISTA ABSORB 3G PWDR (MISCELLANEOUS) IMPLANT
NDL SAFETY ECLIPSE 18X1.5 (NEEDLE) ×1 IMPLANT
NEEDLE HYPO 18GX1.5 SHARP (NEEDLE) ×2
NEEDLE HYPO 25X1 1.5 SAFETY (NEEDLE) ×3 IMPLANT
NS IRRIG 1000ML POUR BTL (IV SOLUTION) ×3 IMPLANT
PACK BASIN DAY SURGERY FS (CUSTOM PROCEDURE TRAY) ×3 IMPLANT
PENCIL BUTTON HOLSTER BLD 10FT (ELECTRODE) ×3 IMPLANT
PIN SAFETY STERILE (MISCELLANEOUS) ×3 IMPLANT
SLEEVE SCD COMPRESS KNEE MED (MISCELLANEOUS) ×3 IMPLANT
SPONGE LAP 18X18 RF (DISPOSABLE) ×12 IMPLANT
SUT ETHILON 2 0 FS 18 (SUTURE) ×6 IMPLANT
SUT MNCRL AB 4-0 PS2 18 (SUTURE) ×3 IMPLANT
SUT MON AB 3-0 SH 27 (SUTURE) ×2
SUT MON AB 3-0 SH27 (SUTURE) ×1 IMPLANT
SUT SILK 2 0 SH (SUTURE) IMPLANT
SUT VICRYL 3-0 CR8 SH (SUTURE) ×6 IMPLANT
SYR CONTROL 10ML LL (SYRINGE) ×3 IMPLANT
TOWEL OR 17X24 6PK STRL BLUE (TOWEL DISPOSABLE) ×3 IMPLANT
TUBE CONNECTING 20'X1/4 (TUBING) ×1
TUBE CONNECTING 20X1/4 (TUBING) ×2 IMPLANT
YANKAUER SUCT BULB TIP NO VENT (SUCTIONS) ×3 IMPLANT

## 2017-12-05 NOTE — Anesthesia Preprocedure Evaluation (Addendum)
Anesthesia Evaluation  Patient identified by MRN, date of birth, ID band Patient awake    Reviewed: Allergy & Precautions, NPO status , Patient's Chart, lab work & pertinent test results  History of Anesthesia Complications Negative for: history of anesthetic complications  Airway Mallampati: III  TM Distance: >3 FB Neck ROM: Full    Dental no notable dental hx. (+) Dental Advisory Given   Pulmonary sleep apnea , former smoker,    Pulmonary exam normal        Cardiovascular negative cardio ROS Normal cardiovascular exam     Neuro/Psych  Headaches, PSYCHIATRIC DISORDERS Anxiety Depression    GI/Hepatic negative GI ROS, Neg liver ROS,   Endo/Other  negative endocrine ROS  Renal/GU negative Renal ROS  negative genitourinary   Musculoskeletal negative musculoskeletal ROS (+)   Abdominal   Peds negative pediatric ROS (+)  Hematology negative hematology ROS (+)   Anesthesia Other Findings   Reproductive/Obstetrics negative OB ROS                            Anesthesia Physical Anesthesia Plan  ASA: III  Anesthesia Plan: General   Post-op Pain Management: GA combined w/ Regional for post-op pain   Induction: Intravenous  PONV Risk Score and Plan: 4 or greater and Ondansetron, Dexamethasone, Scopolamine patch - Pre-op and Diphenhydramine  Airway Management Planned: LMA  Additional Equipment:   Intra-op Plan:   Post-operative Plan: Extubation in OR  Informed Consent:   Plan Discussed with:   Anesthesia Plan Comments:         Anesthesia Quick Evaluation

## 2017-12-05 NOTE — Transfer of Care (Signed)
Immediate Anesthesia Transfer of Care Note  Patient: Erica Higgins  Procedure(s) Performed: SIMPLE MASTECTOMY WITH WIRE LOCALIZATION AND  TARGETED RIGHT AXILLARY SENTINEL NODE MAPPING  AND BIOPSY ERAS PATHWAY (Right Breast)  Patient Location: PACU  Anesthesia Type:GA combined with regional for post-op pain  Level of Consciousness: sedated  Airway & Oxygen Therapy: Patient Spontanous Breathing and Patient connected to face mask oxygen  Post-op Assessment: Report given to RN and Post -op Vital signs reviewed and stable  Post vital signs: Reviewed and stable  Last Vitals:  Vitals Value Taken Time  BP 139/78 12/05/2017  1:40 PM  Temp    Pulse 96 12/05/2017  1:43 PM  Resp 23 12/05/2017  1:43 PM  SpO2 100 % 12/05/2017  1:43 PM  Vitals shown include unvalidated device data.  Last Pain:  Vitals:   12/05/17 0945  TempSrc: Oral  PainSc: 4          Complications: No apparent anesthesia complications

## 2017-12-05 NOTE — Progress Notes (Signed)
Assisted Dr. Singer with right, ultrasound guided, pectoralis block. Side rails up, monitors on throughout procedure. See vital signs in flow sheet. Tolerated Procedure well. °

## 2017-12-05 NOTE — Op Note (Signed)
Preoperative diagnosis: Stage II right breast cancer multifocal  Postoperative diagnosis: Same  Procedure: Right simple mastectomy with targeted right axillary lymph node dissection for deep axillary node and sentinel lymph node mapping with methylene blue dye  Surgeon: Erroll Luna MD  Anesthesia: General with pectoral block  EBL: 150 cc  Drains:  Two 19 French round  Specimen: Right breast tissue with localized right axillary lymph node and 3 right axillary sentinel nodes and inferior flap nodule  IV fluids: Per anesthesia record   Indications for procedure: The patient is a 47 year old female with multifocal stage II right breast cancer.  She is opted for right simple mastectomy and targeted right axillary node dissection for a positive lymph node on core biopsy.  Chemotherapy was considered but the oncologist felt that surgery would be the best first step in the fact that chemotherapy would not reduce her tumor burden enough for lumpectomy. The surgical and non surgical options have been discussed with the patient.  Risks of surgery include bleeding,  Infection,  Flap necrosis,  Tissue loss,  Chronic pain, death, Numbness,  And the need for additional procedures.  Reconstruction options also have been discussed with the patient as well.  The patient agrees to proceed. Sentinel lymph node mapping and dissection has been discussed with the patient.  Risk of bleeding,  Infection,  Seroma formation,  Additional procedures,  Shoulder weakness ,  Shoulder stiffness,  Nerve and blood vessel injury and reaction to the mapping dyes have been discussed.  Alternatives to surgery have been discussed with the patient.  The patient agrees to proceed.   Description of procedure: The patient was met in the holding area.  She underwent localization of the right axillary node with a clip in it with a wire.  The right side was marked as correct.  She underwent a pectoral block.  She was then taken back to  the operating room and placed upon the OR table.  After induction of general anesthesia the right breast was prepped and draped in sterile fashion.  Timeout was done.  Proper patient, side and procedure were verified.  The incision was made at the wire insertion site in the right axilla.  Dissection was carried down the node that was targeted was identified.  Of note 4 cc of methylene blue dye were injected under the nipple.  The target node was also blue in color.  This was a deep axillary node.  It was removed and sent to pathology after imaging revealed it to have a clip in it.  I then made a superior skin flap with a curvilinear incision above and below the nipple areolar complex.  This is a large incision since she had excessively large breasts and excess tissue.  The superior flap was made with cautery.  The inferior map was placed made with cautery.  The superior flap was taken to the clavicle and the inferior flap was taken the inframammary fold.  The breast was then dissected off the chest pectoralis major fascia in a medial to lateral fashion.  Was passed off the field.  Neoprobe was used to identify 3 extra nodes that were deep level 2 nodes and these were removed as sentinel nodes.  The background counts are 0.  Hemostasis was achieved.  2 drains were placed through the inferior flap.  This was secured with 2-0 nylon.  We then closed the wound with 3-0 Vicryl and 4-0 Monocryl subcuticular stitches.  All final counts were found to be  correct.  Dermabond placed.  Breast binder placed.  The patient was awoke extubated taken recovery in satisfactory condition.

## 2017-12-05 NOTE — Anesthesia Procedure Notes (Signed)
Procedure Name: LMA Insertion Date/Time: 12/05/2017 11:36 AM Performed by: Lyndee Leo, CRNA Pre-anesthesia Checklist: Patient identified, Emergency Drugs available, Suction available and Patient being monitored Patient Re-evaluated:Patient Re-evaluated prior to induction Oxygen Delivery Method: Circle system utilized Preoxygenation: Pre-oxygenation with 100% oxygen Induction Type: IV induction Ventilation: Mask ventilation without difficulty LMA: LMA inserted LMA Size: 4.0 Number of attempts: 1 Airway Equipment and Method: Bite block Placement Confirmation: positive ETCO2 Tube secured with: Tape Dental Injury: Teeth and Oropharynx as per pre-operative assessment

## 2017-12-05 NOTE — Interval H&P Note (Signed)
History and Physical Interval Note:  12/05/2017 11:08 AM  Erica Higgins  has presented today for surgery, with the diagnosis of Right breast cancer  The various methods of treatment have been discussed with the patient and family. After consideration of risks, benefits and other options for treatment, the patient has consented to  Procedure(s) with comments: [name] (Right) - PECTORAL BLOCK as a surgical intervention .  The patient's history has been reviewed, patient examined, no change in status, stable for surgery.  I have reviewed the patient's chart and labs.  Questions were answered to the patient's satisfaction.     [place]

## 2017-12-05 NOTE — Anesthesia Postprocedure Evaluation (Signed)
Anesthesia Post Note  Patient: Erica Higgins  Procedure(s) Performed: SIMPLE MASTECTOMY WITH WIRE LOCALIZATION AND  TARGETED RIGHT AXILLARY SENTINEL NODE MAPPING  AND BIOPSY ERAS PATHWAY (Right Breast)     Patient location during evaluation: PACU Anesthesia Type: General Level of consciousness: awake and alert Pain management: pain level controlled Vital Signs Assessment: post-procedure vital signs reviewed and stable Respiratory status: spontaneous breathing, nonlabored ventilation and respiratory function stable Cardiovascular status: blood pressure returned to baseline and stable Postop Assessment: no apparent nausea or vomiting Anesthetic complications: no    Last Vitals:  Vitals:   12/05/17 1430 12/05/17 1500  BP: 113/76 (!) 110/55  Pulse: 66 69  Resp: 19 18  Temp:  (!) 36.2 C  SpO2: 97% 96%    Last Pain:  Vitals:   12/05/17 1500  TempSrc:   PainSc: 2                  Catalina Gravel

## 2017-12-05 NOTE — Anesthesia Procedure Notes (Signed)
Anesthesia Regional Block: Pectoralis block   Pre-Anesthetic Checklist: ,, timeout performed, Correct Patient, Correct Site, Correct Laterality, Correct Procedure, Correct Position, site marked, Risks and benefits discussed,  Surgical consent,  Pre-op evaluation,  At surgeon's request and post-op pain management  Laterality: Right  Prep: chloraprep       Needles:  Injection technique: Single-shot  Needle Type: Echogenic Stimulator Needle     Needle Length: 10cm  Needle Gauge: 21     Additional Needles:   Narrative:  Start time: 12/05/2017 10:36 AM End time: 12/05/2017 10:46 AM Injection made incrementally with aspirations every 5 mL.  Performed by: Personally

## 2017-12-05 NOTE — Progress Notes (Signed)
Nuc med inj performed by nuc med staff. Additional fentanyl and versed given (per v/o dr singer) for discomfort. Pt tol well after sedation. VSS. Will call family to bedside and update/provide emotional support

## 2017-12-06 ENCOUNTER — Ambulatory Visit: Payer: Self-pay | Admitting: Genetics

## 2017-12-06 ENCOUNTER — Telehealth: Payer: Self-pay | Admitting: Hematology and Oncology

## 2017-12-06 ENCOUNTER — Encounter (HOSPITAL_BASED_OUTPATIENT_CLINIC_OR_DEPARTMENT_OTHER): Payer: Self-pay | Admitting: Surgery

## 2017-12-06 ENCOUNTER — Encounter: Payer: Self-pay | Admitting: Genetics

## 2017-12-06 DIAGNOSIS — Z1379 Encounter for other screening for genetic and chromosomal anomalies: Secondary | ICD-10-CM | POA: Insufficient documentation

## 2017-12-06 DIAGNOSIS — Z800 Family history of malignant neoplasm of digestive organs: Secondary | ICD-10-CM

## 2017-12-06 DIAGNOSIS — Z803 Family history of malignant neoplasm of breast: Secondary | ICD-10-CM

## 2017-12-06 DIAGNOSIS — C50511 Malignant neoplasm of lower-outer quadrant of right female breast: Secondary | ICD-10-CM

## 2017-12-06 DIAGNOSIS — Z170 Estrogen receptor positive status [ER+]: Principal | ICD-10-CM

## 2017-12-06 DIAGNOSIS — C50811 Malignant neoplasm of overlapping sites of right female breast: Secondary | ICD-10-CM | POA: Diagnosis not present

## 2017-12-06 NOTE — Progress Notes (Signed)
HPI: Erica Higgins was previously seen in the [place] clinic on 11/14/2017 due to a personal and family history of cancer and concerns regarding a hereditary predisposition to cancer. Please refer to our prior cancer genetics clinic note for more information regarding Erica Higgins's medical, social and family histories, and our assessment and recommendations, at the time. Erica Higgins recent genetic test results were disclosed to her, as well as recommendations warranted by these results. These results and recommendations are discussed in more detail below.  On 11/06/2017, at the age of 47, Erica Higgins was diagnosed with invasive ductal carcinoma of the right breast, ER+, PR+, HER2-.  She underwent simple mastectomy on 12/05/2017.   CANCER HISTORY:    Malignant neoplasm of lower-outer quadrant of right breast of female, estrogen receptor positive ([place])   11/06/2017 Initial Diagnosis    Palpable right breast mass which on radiologic evaluation was epidermoid cyst; incidentally found additional right breast mass at 8 o'clock position: 1.6 cm: IDC grade 2, ER 90%, PR 100%, Ki-67 2%, HER-2 negative, T1CN0 stage I a AJCC 8      11/25/2017 Genetic Testing    The Common Hereditary Cancer Panel offered by [facility] includes sequencing and/or deletion duplication testing of the following 47 genes: APC, ATM, AXIN2, BARD1, BMPR1A, BRCA1, BRCA2, BRIP1, CDH1, CDKN2A (p14ARF), CDKN2A (p16INK4a), CKD4, CHEK2, CTNNA1, DICER1, EPCAM (Deletion/duplication testing only), GREM1 (promoter region deletion/duplication testing only), KIT, MEN1, MLH1, MSH2, MSH3, MSH6, MUTYH, NBN, NF1, NHTL1, PALB2, PDGFRA, PMS2, POLD1, POLE, PTEN, RAD50, RAD51C, RAD51D, SDHB, SDHC, SDHD, SMAD4, SMARCA4. STK11, TP53, TSC1, TSC2, and VHL.  The following genes were evaluated for sequence changes only: SDHA and HOXB13 c.251G>A variant only.  Results: Negative, no pathogenic variants identified.  The date of this test report is 11/25/2017.         FAMILY HISTORY:  We obtained a detailed, 4-generation family history.  Significant diagnoses are listed below: Family History  Problem Relation Age of Onset  . Emphysema Father        smoker  . Allergies Father   . Hyperlipidemia Father   . Hypertension Father   . Allergies Mother   . Hypertension Mother   . Rheum arthritis Mother   . Osteoporosis Mother   . Skin cancer Mother   . Breast cancer Other   . Heart attack Paternal Grandfather 84  . Colon cancer Paternal Aunt        dx 50's/60's  . Pancreatic cancer Maternal Grandmother 54  . Colon cancer Paternal Uncle 40  . Skin cancer Paternal Grandmother   . Breast cancer Cousin        dx 30's/40's  . Lung cancer Paternal Uncle     Erica Higgins has twins, a boy and a girl, who are 43.  Erica Higgins has no siblings.   Erica Higgins father is 90 with no history of cancer.  Erica Higgins has 3 paternal uncles and 1 paternal aunt: -1 paternal aunt diagnosed with colon cancer in her 43's/60's and is currently in her 32's.  She had 2 sons and a daughter with no history of cancer.  -1 paternal uncle currently in his 30's and has colon cancer diagnosed in his 60's.  He had a son and 3 daughters.  One of these daughters (pateint's cousin) was diagnosed with breast cancer in her 85's/40's.  -1 paternal uncle with lung cancer and is in his 67's.   -1 paternal cousin with no history of cancer currently in his 10's.  No other paternal cousins with any history of cancer.  Erica Higgins paternal grandfather died in his 52's due to a heart attack.  Erica Higgins paternal grandmother died in her 90's and had 1 skin cancer at an 'older age-type unk.   Erica Higgins mother is 26 with no history of cancer.  She has her uterus and ovaries intact.  Erica Higgins has a maternal aunt and a maternal uncle in their 36's with no history of cancer.  No maternal cousins with any history of cancer. Erica Higgins maternal grandfather died in his 50's, had a history of alcohol abuse, unk if he  ever had cancer.  Erica Higgins maternal grandmother died of pancreatic cancer in her 41's.  This grandmother's mother (pateint's great grandmother) and her sister (patient's [place] aunt) both had breast cancer.   Erica Higgins is unaware of previous family history of genetic testing for hereditary cancer risks. Patient's maternal ancestors are of Caucasian descent, and paternal ancestors are of Caucasian descent. There is no reported Ashkenazi Jewish ancestry. There is no known consanguinity.  GENETIC TEST RESULTS: Genetic testing performed through [facility]'s Common Hereditary Cancers Panel reported out on 11/25/2017 showed no pathogenic mutations. The Common Hereditary Cancer Panel offered by [facility] includes sequencing and/or deletion duplication testing of the following 47 genes: APC, ATM, AXIN2, BARD1, BMPR1A, BRCA1, BRCA2, BRIP1, CDH1, CDKN2A (p14ARF), CDKN2A (p16INK4a), CKD4, CHEK2, CTNNA1, DICER1, EPCAM (Deletion/duplication testing only), GREM1 (promoter region deletion/duplication testing only), KIT, MEN1, MLH1, MSH2, MSH3, MSH6, MUTYH, NBN, NF1, NHTL1, PALB2, PDGFRA, PMS2, POLD1, POLE, PTEN, RAD50, RAD51C, RAD51D, SDHB, SDHC, SDHD, SMAD4, SMARCA4. STK11, TP53, TSC1, TSC2, and VHL.  The following genes were evaluated for sequence changes only: SDHA and HOXB13 c.251G>A variant only..  The test report will be scanned into EPIC and will be located under the Molecular Pathology section of the Results Review tab.A portion of the result report is included below for reference.     We discussed with Erica Higgins that because current genetic testing is not perfect, it is possible there may be a gene mutation in one of these genes that current testing cannot detect, but that chance is small. We also discussed, that there could be another gene that has not yet been discovered, or that we have not yet tested, that is responsible for the cancer diagnoses in the family. It is also possible there is a hereditary  cause for the cancer in the family that Erica Higgins did not inherit and therefore was not identified in her testing.  Therefore, it is important to remain in touch with cancer genetics in the future so that we can continue to offer Erica Higgins the most up to date genetic testing.   ADDITIONAL GENETIC TESTING: We discussed with Erica Higgins that there are other genes that are associated with increased cancer risk that can be analyzed. The laboratories that offer this testing look at these additional genes via a hereditary cancer gene panel. Should Ms. Milius wish to pursue additional genetic testing, we are happy to discuss and coordinate this testing, at any time.    CANCER SCREENING RECOMMENDATIONS: Ms. Mallis test result is  negative (normal). This means that we have not identified a hereditary cause for her personal and family history of cancer at this time.   While reassuring, this does not definitively rule out a hereditary basis for her cancer. It is still possible that there could be genetic mutations that are undetectable by current technology, or genetic mutations in genes  that have not been tested or identified to increase cancer risk.  There could also be a hereditary cause for the cancer in Ms. Pohle's family that she did not inherit and therefore was not identified in her.  Therefore, Ms. Dettmann was advised to continue following the cancer screening guidelines provided by her primary healthcare providers. Other factors such as her personal and family history may still affect her cancer risk.    RECOMMENDATIONS FOR FAMILY MEMBERS: Relatives in this family might be at some increased risk of developing cancer, over the general population risk, simply due to the family history of cancer. We recommended women in this family have a yearly mammogram beginning at age 71, or 31 years younger than the earliest onset of cancer, an annual clinical breast exam, and perform monthly breast self-exams. We recommend her  daughter have mammograms starting by age 22. Women in this family should also have a gynecological exam as recommended by their primary provider. All family members should have a colonoscopy by age 58 (or earlier based on family history).  We recommend her father have colonocsopies every 5 years or as directed by his physicians.   All family members should inform their physicians about the family history of cancer so their doctors can make the most appropriate screening recommendations for them.   Based on Ms. Shipper's family history, we recommended relatives on both sides of the family, have genetic counseling and testing. Ms. Joffe will let us know if we can be of any assistance in coordinating genetic counseling and/or testing for these family members.   FOLLOW-UP: Lastly, we discussed with Ms. Krakowski that cancer genetics is a rapidly advancing field and it is possible that new genetic tests will be appropriate for her and/or her family members in the future. We encouraged her to remain in contact with cancer genetics on an annual basis so we can update her personal and family histories and let her know of advances in cancer genetics that may benefit this family.   Our contact number was provided. Ms. Linebaugh questions were answered to her satisfaction, and she knows she is welcome to call us at anytime with additional questions or concerns.   Ferol Luz, MS, [facility] Certified Genetic Counselor [email or web address]

## 2017-12-06 NOTE — Discharge Summary (Signed)
Physician Discharge Summary  Patient ID: Erica Higgins MRN: [id number] DOB/AGE: 03/14/1971 47 y.o.  Admit date: 12/05/2017 Discharge date: 12/06/2017  Admission Diagnoses:right breast cancer  Discharge Diagnoses:  Active Problems:   Breast cancer, stage 2, right [facility] [place])   Discharged Condition: good  Hospital Course: unremarkable.  Pt did well.  Had good pain control, tolerated her diet and could ambulate   Consults: None    Treatments: surgery: right simple mastectomy and SLN mapping   Discharge Exam: Blood pressure 115/63, pulse 60, temperature 97.7 F (36.5 C), resp. rate 16, height 5\' 2"  (1.575 m), weight 119.3 kg (263 lb), SpO2 99 %. Incision/Wound:CDI no hematoma  Flaps viable   Disposition: Discharge disposition: 01-Home or Self Care       Discharge Instructions    Diet - low sodium heart healthy   Complete by:  As directed    Diet - low sodium heart healthy   Complete by:  As directed    Increase activity slowly   Complete by:  As directed    Increase activity slowly   Complete by:  As directed         Signed: Joyice Faster Sharolyn Weber 12/06/2017, 5:57 AM

## 2017-12-06 NOTE — Telephone Encounter (Signed)
Mailed patient calendar of upcoming [facility] appointments per 4/5 sch message.

## 2017-12-06 NOTE — Discharge Instructions (Signed)
About my Jackson-Pratt Bulb Drain  What is a Jackson-Pratt bulb? A Jackson-Pratt is a soft, round device used to collect drainage. It is connected to a long, thin drainage catheter, which is held in place by one or two small stiches near your surgical incision site. When the bulb is squeezed, it forms a vacuum, forcing the drainage to empty into the bulb.  Emptying the Jackson-Pratt bulb- To empty the bulb: 1. Release the plug on the top of the bulb. 2. Pour the bulb's contents into a measuring container which your nurse will provide. 3. Record the time emptied and amount of drainage. Empty the drain(s) as often as your     doctor or nurse recommends.  Date                  Time                    Amount (Drain 1)                 Amount (Drain 2)  _____________________________________________________________________  _____________________________________________________________________  _____________________________________________________________________  _____________________________________________________________________  _____________________________________________________________________  _____________________________________________________________________  _____________________________________________________________________  _____________________________________________________________________  Squeezing the Jackson-Pratt Bulb- To squeeze the bulb: 1. Make sure the plug at the top of the bulb is open. 2. Squeeze the bulb tightly in your fist. You will hear air squeezing from the bulb. 3. Replace the plug while the bulb is squeezed. 4. Use a safety pin to attach the bulb to your clothing. This will keep the catheter from     pulling at the bulb insertion site.  When to call your doctor- Call your doctor if:  Drain site becomes red, swollen or hot.  You have a fever greater than 101 degrees F.  There is oozing at the drain site.  Drain falls out (apply a guaze  bandage over the drain hole and secure it with tape).  Drainage increases daily not related to activity patterns. (You will usually have more drainage when you are active than when you are resting.)  Drainage has a bad odor.  [facility]___[facility] [phone]  MASTECTOMY: POST OP INSTRUCTIONS  Always review your discharge instruction sheet given to you by the facility where your surgery was performed. IF YOU HAVE DISABILITY OR FAMILY LEAVE FORMS, YOU MUST BRING THEM TO THE OFFICE FOR PROCESSING.   DO NOT GIVE THEM TO YOUR DOCTOR. A prescription for pain medication may be given to you upon discharge.  Take your pain medication as prescribed, if needed.  If narcotic pain medicine is not needed, then you may take acetaminophen (Tylenol) or ibuprofen (Advil) as needed. 1. Take your usually prescribed medications unless otherwise directed. 2. If you need a refill on your pain medication, please contact your pharmacy.  They will contact our office to request authorization.  Prescriptions will not be filled after 5pm or on week-ends. 3. You should follow a light diet the first few days after arrival home, such as soup and crackers, etc.  Resume your normal diet the day after surgery. 4. Most patients will experience some swelling and bruising on the chest and underarm.  Ice packs will help.  Swelling and bruising can take several days to resolve.  5. It is common to experience some constipation if taking pain medication after surgery.  Increasing fluid intake and taking a stool softener (such as Colace) will usually help or prevent this problem from occurring.  A mild laxative (Milk of Magnesia or Miralax) should be  taken according to package instructions if there are no bowel movements after 48 hours. 6. Unless discharge instructions indicate otherwise, leave your bandage dry and in place until your next appointment in 3-5 days.  You may take a limited sponge bath.  No tube baths or  showers until the drains are removed.  You may have steri-strips (small skin tapes) in place directly over the incision.  These strips should be left on the skin for 7-10 days.  If your surgeon used skin glue on the incision, you may shower in 24 hours.  The glue will flake off over the next 2-3 weeks.  Any sutures or staples will be removed at the office during your follow-up visit. 7. DRAINS:  If you have drains in place, it is important to keep a list of the amount of drainage produced each day in your drains.  Before leaving the hospital, you should be instructed on drain care.  Call our office if you have any questions about your drains. 8. ACTIVITIES:  You may resume regular (light) daily activities beginning the next day--such as daily self-care, walking, climbing stairs--gradually increasing activities as tolerated.  You may have sexual intercourse when it is comfortable.  Refrain from any heavy lifting or straining until approved by your doctor. a. You may drive when you are no longer taking prescription pain medication, you can comfortably wear a seatbelt, and you can safely maneuver your car and apply brakes. b. RETURN TO WORK:  __________________________________________________________ 9. You should see your doctor in the office for a follow-up appointment approximately 3-5 days after your surgery.  Your doctors nurse will typically make your follow-up appointment when she calls you with your pathology report.  Expect your pathology report 2-3 business days after your surgery.  You may call to check if you do not hear from [place] after three days.   10. OTHER INSTRUCTIONS: ______________________________________________________________________________________________ ____________________________________________________________________________________________ WHEN TO CALL YOUR DOCTOR: 1. Fever over 101.0 2. Nausea and/or vomiting 3. Extreme swelling or bruising 4. Continued bleeding from  incision. 5. Increased pain, redness, or drainage from the incision. The clinic staff is available to answer your questions during regular business hours.  Please dont hesitate to call and ask to speak to one of the nurses for clinical concerns.  If you have a medical emergency, go to the nearest emergency room or call 911.  A surgeon from [facility] is always on call at the hospital. [address] [place], [place], [place]  [address] ? [address], [place], [place]   [address] [phone] ? [phone] ? FAX [phone] Web site: [facility] drains are used to remove extra fluid that normally builds up in a surgical wound after surgery. A surgical drain helps to heal a surgical wound. Different kinds of surgical drains include:  Active drains. These drains use suction to pull drainage away from the surgical wound. Drainage flows through a tube to a container outside of the body. It is important to keep the bulb or the drainage container flat (compressed) at all times, except while you empty it. Flattening the bulb or container creates suction. The two most common types of active drains are bulb drains and Hemovac drains.  Passive drains. These drains allow fluid to drain naturally, by gravity. Drainage flows through a tube to a bandage (dressing) or a container outside of the body. Passive drains do not need to be emptied. The most common type of passive  drain is the Penrose drain.  A drain is placed during surgery. Immediately after surgery, drainage is usually bright red and a little thicker than water. The drainage may gradually turn yellow or pink and become thinner. It is likely that your health care provider will remove the drain when the drainage stops or when the amount decreases to 1-2 Tbsp (15-30 mL) during a 24-hour period. How to care for your surgical drain  Keep the skin around the drain dry and covered with a dressing at  all times.  Check your drain area every day for signs of infection. Check for: ? More redness, swelling, or pain. ? Pus or a bad smell. ? Cloudy drainage. Follow instructions from your health care provider about how to take care of your drain and how to change your dressing. Change your dressing at least one time every day. Change it more often if needed to keep the dressing dry. Make sure you: 1. Gather your supplies, including: ? Tape. ? Germ-free cleaning solution (sterile saline). ? Split gauze drain sponge: 4 x 4 inches (10 x 10 cm). ? Gauze square: 4 x 4 inches (10 x 10 cm). 2. Wash your hands with soap and water before you change your dressing. If soap and water are not available, use hand sanitizer. 3. Remove the old dressing. Avoid using scissors to do that. 4. Use sterile saline to clean your skin around the drain. 5. Place the tube through the slit in a drain sponge. Place the drain sponge so that it covers your wound. 6. Place the gauze square or another drain sponge on top of the drain sponge that is on the wound. Make sure the tube is between those layers. 7. Tape the dressing to your skin. 8. If you have an active bulb or Hemovac drain, tape the drainage tube to your skin 1-2 inches (2.5-5 cm) below the place where the tube enters your body. Taping keeps the tube from pulling on any stitches (sutures) that you have. 9. Wash your hands with soap and water. 10. Write down the color of your drainage and how often you change your dressing.  How to empty your active bulb or Hemovac drain 1. Make sure that you have a measuring cup that you can empty your drainage into. 2. Wash your hands with soap and water. If soap and water are not available, use hand sanitizer. 3. Gently move your fingers down the tube while squeezing very lightly. This is called stripping the tube. This clears any drainage, clots, or tissue from the tube. ? Do not pull on the tube. ? You may need to strip the  tube several times every day to keep the tube clear. 4. Open the bulb cap or the drain plug. Do not touch the inside of the cap or the bottom of the plug. 5. Empty all of the drainage into the measuring cup. 6. Compress the bulb or the container and replace the cap or the plug. To compress the bulb or the container, squeeze it firmly in the middle while you close the cap or plug the container. 7. Write down the amount of drainage that you have in each 24-hour period. If you have less than 2 Tbsp (30 mL) of drainage during 24 hours, contact your health care provider. 8. Flush the drainage down the toilet. 9. Wash your hands with soap and water. Contact a health care provider if:  You have more redness, swelling, or pain around your drain area.  The amount of drainage that you have is increasing instead of decreasing.  You have pus or a bad smell coming from your drain area.  You have a fever.  You have drainage that is cloudy.  There is a sudden stop or a sudden decrease in the amount of drainage that you have.  Your tube falls out.  Your active draindoes not stay compressedafter you empty it. This information is not intended to replace advice given to you by your health care provider. Make sure you discuss any questions you have with your health care provider. Document Released: 08/17/2000 Document Revised: 01/26/2016 Document Reviewed: 03/09/2015 Elsevier Interactive Patient Education  2018 [employer].

## 2017-12-11 ENCOUNTER — Telehealth: Payer: Self-pay | Admitting: *Deleted

## 2017-12-11 NOTE — Telephone Encounter (Signed)
Received order for mammaprint testing. Requisition faxed to pathology and [facility]. Received by Keisha.  

## 2017-12-12 ENCOUNTER — Other Ambulatory Visit (HOSPITAL_COMMUNITY)

## 2017-12-12 ENCOUNTER — Encounter

## 2017-12-16 NOTE — Assessment & Plan Note (Signed)
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 I discussed the margins as well as lymph node surgeries. We also discussed the final staging along with previously performed ER/PR and HER-2/neu testing.  Plan: 1. Mammaprint testing to determine benefit of chemo 2. Adj XRT 3. Foll by Anti estrogen therapy  RTC based on Mammaprint test result.

## 2017-12-16 NOTE — Progress Notes (Signed)
Patient Care Team: Berenice Primas as PCP - General (Nurse Practitioner) Chipper Herb, MD as Referring Physician (Family Medicine) Cristal Deer, DPM as Attending Physician (Podiatry)  DIAGNOSIS:  Encounter Diagnosis  Name Primary?  . Malignant neoplasm of lower-outer quadrant of right breast of female, estrogen receptor positive ([place])     SUMMARY OF ONCOLOGIC HISTORY:   Malignant neoplasm of lower-outer quadrant of right breast of female, estrogen receptor positive ([place])   11/06/2017 Initial Diagnosis    Palpable right breast mass which on radiologic evaluation was epidermoid cyst; incidentally found additional right breast mass at 8 o'clock position: 1.6 cm: IDC grade 2, ER 90%, PR 100%, Ki-67 2%, HER-2 negative, T1CN0 stage I a AJCC 8      11/25/2017 Genetic Testing    The Common Hereditary Cancer Panel offered by [facility] includes sequencing and/or deletion duplication testing of the following 47 genes: APC, ATM, AXIN2, BARD1, BMPR1A, BRCA1, BRCA2, BRIP1, CDH1, CDKN2A (p14ARF), CDKN2A (p16INK4a), CKD4, CHEK2, CTNNA1, DICER1, EPCAM (Deletion/duplication testing only), GREM1 (promoter region deletion/duplication testing only), KIT, MEN1, MLH1, MSH2, MSH3, MSH6, MUTYH, NBN, NF1, NHTL1, PALB2, PDGFRA, PMS2, POLD1, POLE, PTEN, RAD50, RAD51C, RAD51D, SDHB, SDHC, SDHD, SMAD4, SMARCA4. STK11, TP53, TSC1, TSC2, and VHL.  The following genes were evaluated for sequence changes only: SDHA and HOXB13 c.251G>A variant only.  Results: Negative, no pathogenic variants identified.  The date of this test report is 11/25/2017.       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  She is healing and recovering very well from recent surgery.  She is complaining along with swelling of pain in the postsurgical area.  The drains were removed last week and she has some discomfort and swelling in the axilla.  She is taking Motrin for pain relief.  She plans to see the surgeon today.  She denies any fevers or chills.  She is taking pain medication intermittently.  REVIEW OF SYSTEMS:   Constitutional: Denies fevers, chills or abnormal weight loss Eyes: Denies blurriness of vision Ears, nose, mouth, throat, and face: Denies mucositis or sore throat Respiratory: Denies cough, dyspnea or wheezes Cardiovascular: Denies palpitation, chest discomfort Gastrointestinal:  Denies nausea, heartburn or change in bowel habits Skin: Denies abnormal skin rashes Lymphatics: Denies new lymphadenopathy or easy bruising Neurological:Denies numbness, tingling or new weaknesses Behavioral/Psych: Mood is stable, no new changes  Extremities: No lower extremity edema Breast: Right mastectomy All other systems were reviewed with the patient and are negative.  I have reviewed the past medical history, past surgical history, social history and family history with the patient and they are unchanged from previous note.  ALLERGIES:  is allergic to cleocin [clindamycin hcl]; ivp dye [iodinated diagnostic agents]; latex; penicillins; shellfish allergy; and adhesive [tape].  MEDICATIONS:  Current Outpatient Medications  Medication Sig Dispense Refill  . diazepam (VALIUM) 2 MG tablet TAKE ONE TABLET BY MOUTH EVERY TWELVE HOURS AS NEEDED  2  . ibuprofen (ADVIL,MOTRIN) 800 MG tablet Take 800 mg by mouth every 8 (eight) hours as needed.    Marland Kitchen ibuprofen (ADVIL,MOTRIN) 800 MG tablet Take  1 tablet (800 mg total) by mouth every 8 (eight) hours as needed. 30 tablet 0  . oxyCODONE (OXY IR/ROXICODONE) 5 MG immediate release tablet Take 1  tablet (5 mg total) by mouth every 6 (six) hours as needed for severe pain. 20 tablet 0  . zolmitriptan (ZOMIG-ZMT) 5 MG disintegrating tablet Take 5 mg by mouth as needed.      No current facility-administered medications for this visit.     PHYSICAL EXAMINATION: ECOG PERFORMANCE STATUS: 1 - Symptomatic but completely ambulatory  Vitals:   12/17/17 0830  BP: (!) 120/43  Pulse: 65  Resp: 20  Temp: 98.5 F (36.9 C)  SpO2: 100%   Filed Weights   12/17/17 0830  Weight: 259 lb 3.2 oz (117.6 kg)    GENERAL:alert, no distress and comfortable SKIN: skin color, texture, turgor are normal, no rashes or significant lesions EYES: normal, Conjunctiva are pink and non-injected, sclera clear OROPHARYNX:no exudate, no erythema and lips, buccal mucosa, and tongue normal  NECK: supple, thyroid normal size, non-tender, without nodularity LYMPH:  no palpable lymphadenopathy in the cervical, axillary or inguinal LUNGS: clear to auscultation and percussion with normal breathing effort HEART: regular rate & rhythm and no murmurs and no lower extremity edema ABDOMEN:abdomen soft, non-tender and normal bowel sounds MUSCULOSKELETAL:no cyanosis of digits and no clubbing  NEURO: alert & oriented x 3 with fluent speech, no focal motor/sensory deficits EXTREMITIES: No lower extremity edema  LABORATORY DATA:  I have reviewed the data as listed CMP Latest Ref Rng & Units 11/13/2017 02/29/2016 10/05/2015  Glucose 70 - 140 mg/dL 123 102(H) -  BUN 7 - 26 mg/dL 12 13 -  Creatinine 0.60 - 1.10 mg/dL 0.84 0.74 -  Sodium 136 - 145 mmol/L 139 140 -  Potassium 3.5 - 5.1 mmol/L 3.8 3.8 -  Chloride 98 - 109 mmol/L 104 98 -  CO2 22 - 29 mmol/L 29 24 -  Calcium 8.4 - 10.4 mg/dL 9.4 9.1 -  Total Protein 6.4 - 8.3 g/dL 7.4 7.2 6.8  Total Bilirubin 0.2 - 1.2 mg/dL 0.5 <0.2 0.5  Alkaline Phos 40 - 150 U/L 87 94 67  AST 5 - 34 U/L 16 18 14   ALT 0 - 55 U/L 22 17 16     Lab Results  Component Value Date   WBC 8.4  11/13/2017   HGB 13.3 02/29/2016   HCT 41.5 11/13/2017   MCV 87.5 11/13/2017   PLT 295 11/13/2017   NEUTROABS 5.8 11/13/2017    ASSESSMENT & PLAN:  Malignant neoplasm of lower-outer quadrant of right breast of female, estrogen receptor positive ([place]) 12/05/17: Right mastectomy: 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 I discussed the margins as well as lymph node surgeries. We also discussed the final staging along with previously performed ER/PR and HER-2/neu testing.  Plan: 1. Mammaprint testing to determine benefit of chemo 2. Adj XRT 3. Foll by Anti estrogen therapy  RTC based on Mammaprint test result.  No orders of the defined types were placed in this encounter.  The patient has a good understanding of the overall plan. she agrees with it. she will call with any problems that may develop before the next visit here.   Harriette Ohara, MD 12/17/17

## 2017-12-17 ENCOUNTER — Inpatient Hospital Stay: Attending: Hematology and Oncology | Admitting: Hematology and Oncology

## 2017-12-17 DIAGNOSIS — Z9011 Acquired absence of right breast and nipple: Secondary | ICD-10-CM | POA: Diagnosis not present

## 2017-12-17 DIAGNOSIS — Z170 Estrogen receptor positive status [ER+]: Secondary | ICD-10-CM | POA: Diagnosis not present

## 2017-12-17 DIAGNOSIS — C50511 Malignant neoplasm of lower-outer quadrant of right female breast: Secondary | ICD-10-CM | POA: Insufficient documentation

## 2017-12-19 ENCOUNTER — Ambulatory Visit: Attending: Surgery | Admitting: Physical Therapy

## 2017-12-19 ENCOUNTER — Encounter: Payer: Self-pay | Admitting: Physical Therapy

## 2017-12-19 ENCOUNTER — Other Ambulatory Visit: Payer: Self-pay

## 2017-12-19 DIAGNOSIS — R293 Abnormal posture: Secondary | ICD-10-CM | POA: Diagnosis present

## 2017-12-19 DIAGNOSIS — Z170 Estrogen receptor positive status [ER+]: Secondary | ICD-10-CM | POA: Insufficient documentation

## 2017-12-19 DIAGNOSIS — M25611 Stiffness of right shoulder, not elsewhere classified: Secondary | ICD-10-CM | POA: Insufficient documentation

## 2017-12-19 DIAGNOSIS — C50511 Malignant neoplasm of lower-outer quadrant of right female breast: Secondary | ICD-10-CM | POA: Diagnosis present

## 2017-12-19 DIAGNOSIS — Z483 Aftercare following surgery for neoplasm: Secondary | ICD-10-CM | POA: Diagnosis present

## 2017-12-19 DIAGNOSIS — M79621 Pain in right upper arm: Secondary | ICD-10-CM | POA: Insufficient documentation

## 2017-12-19 NOTE — Therapy (Signed)
[place], [place], [address] Phone: [phone]   Fax:  [phone]  Physical Therapy Evaluation  Patient Details  Name: Erica Higgins MRN: [id number] Date of Birth: 06-29-1971 Referring Provider: Dr. Erroll Luna   Encounter Date: 12/19/2017  PT End of Session - 12/19/17 1019    Visit Number  2    Number of Visits  9    Date for PT Re-Evaluation  01/16/18    PT Start Time  0940    PT Stop Time  1025    PT Time Calculation (min)  45 min    Activity Tolerance  Patient tolerated treatment well    Behavior During Therapy  [facility] for tasks assessed/performed       Past Medical History:  Diagnosis Date  . Abdominal pain, acute, right upper quadrant   . Anxiety   . Back pain   . Depression   . Joint pain   . Migraine   . Obesity   . Pain    Toes on bilateral feet  . Polycystic ovarian disease   . Sleep apnea     Past Surgical History:  Procedure Laterality Date  . CESAREAN SECTION    . MINISCUS REPAIR  2003 and 2005  . SIMPLE MASTECTOMY WITH AXILLARY SENTINEL NODE BIOPSY Right 12/05/2017   Procedure: SIMPLE MASTECTOMY WITH WIRE LOCALIZATION AND  TARGETED RIGHT AXILLARY SENTINEL NODE MAPPING  AND BIOPSY ERAS PATHWAY;  Surgeon: Erroll Luna, MD;  Location: [place];  Service: General;  Laterality: Right;  PECTORAL BLOCK  . TONSILLECTOMY  1996    There were no vitals filed for this visit.   Subjective Assessment - 12/19/17 0946    Subjective  Patient underwent a right mastectomy and sentinel node biopsy (1/5 positive nodes) on 12/05/17. Drains were removed 12/13/17. Last week she reports she felt great and had good ROM but this week seems worse with c/o axillary swelling and tightness. She is awaiting Mammaprint test results to know if she needs chemotherapy.    Pertinent History  Patient was diagnosed on 11/01/17 with right grade 2 invasive ductal carcinoma breast cancer. It is ER/PR  positive an HER2 negative with a ki67 of 2%. She has no other health problems. Patient underwent a right mastectomy and sentinel node biopsy (1/5 positive nodes) on 12/05/17.     Patient Stated Goals  Make my arm back to normal    Currently in Pain?  Yes    Pain Score  4     Pain Location  Axilla    Pain Orientation  Right    Pain Descriptors / Indicators  Dull Deep    Pain Type  Surgical pain    Pain Onset  1 to 4 weeks ago    Pain Frequency  Intermittent    Aggravating Factors   Raising arm up    Pain Relieving Factors  Rest         [facility] PT Assessment - 12/19/17 0001      Assessment   Medical Diagnosis  s/p right mastectomy and SLNB    Referring Provider  Dr. [name] Cornett    Onset Date/Surgical Date  12/05/17    Hand Dominance  Right    Prior Therapy  Baselines      Precautions   Precautions  Other (comment)    Precaution Comments  Rt arm lymphedema risk      Restrictions   Weight Bearing Restrictions  No  Balance Screen   Has the patient fallen in the past 6 months  No    Has the patient had a decrease in activity level because of a fear of falling?   No    Is the patient reluctant to leave their home because of a fear of falling?   No      Home Environment   Living Environment  Private residence    Living Arrangements  Children 63 y.o. 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 Slightly open area on incision which is draining very small amount of fluid.      Posture/Postural Control   Posture/Postural Control  Postural limitations    Postural  Limitations  Rounded Shoulders;Forward head      ROM / Strength   AROM / PROM / Strength  AROM;Strength      AROM   AROM Assessment Site  Shoulder    Right/Left Shoulder  Right    Right Shoulder Extension  22 Degrees    Right Shoulder Flexion  112 Degrees    Right Shoulder ABduction  79 Degrees    Right Shoulder Internal Rotation  49 Degrees    Right Shoulder External Rotation  68 Degrees      Palpation   Palpation comment  Tender to palpation right superior chest.       Special Tests   Other special tests  Positive right UE neural tension test.        LYMPHEDEMA/ONCOLOGY QUESTIONNAIRE - 12/19/17 0958      Type   Cancer Type  Right breast      Surgeries   Mastectomy Date  12/05/17    Sentinel Lymph Node Biopsy Date  12/05/17    Number Lymph Nodes Removed  5      Treatment   Active Chemotherapy Treatment  No    Past Chemotherapy Treatment  No    Active Radiation Treatment  No    Past Radiation Treatment  No    Current Hormone Treatment  No    Past Hormone Therapy  No      What other symptoms do you have   Are you Having Heaviness or Tightness  Yes    Are you having Pain  Yes    Are you having pitting edema  No    Is it Hard or Difficult finding clothes that fit  No    Do you have infections  No    Is there Decreased scar mobility  Yes    Stemmer Sign  No      Lymphedema Assessments   Lymphedema Assessments  Upper extremities      Right Upper Extremity Lymphedema   10 cm Proximal to Olecranon Process  43.4 cm    Olecranon Process  29.7 cm    10 cm Proximal to Ulnar Styloid Process  28 cm    Just Proximal to Ulnar Styloid Process  19 cm    Across Hand at [employer]  20 cm  At [facility] of 2nd Digit  6.8 cm      Left Upper Extremity Lymphedema   10 cm Proximal to Olecranon Process  40.5 cm    Olecranon Process  30.2 cm    10 cm Proximal to Ulnar Styloid Process  27.3 cm    Just Proximal to Ulnar Styloid Process  18.7 cm    Across Hand at [employer]   19.7 cm    At [place] of 2nd Digit  6.7 cm          Quick Dash - 12/19/17 0001    Open a tight or new jar  Mild difficulty    Do heavy household chores (wash walls, wash floors)  Severe difficulty    Carry a shopping bag or briefcase  Moderate difficulty    Wash your back  Moderate difficulty    Use a knife to cut food  Mild difficulty    Recreational activities in which you take some force or impact through your arm, shoulder, or hand (golf, hammering, tennis)  Moderate difficulty    During the past week, to what extent has your arm, shoulder or hand problem interfered with your normal social activities with family, friends, neighbors, or groups?  Extremely    During the past week, to what extent has your arm, shoulder or hand problem limited your work or other regular daily activities  Quite a bit    Arm, shoulder, or hand pain.  Severe    Tingling (pins and needles) in your arm, shoulder, or hand  None    Difficulty Sleeping  Severe difficulty    DASH Score  54.55 %        Objective measurements completed on examination: See above findings.                   PT Education - 12/19/17 1018    Education provided  Yes    Education Details  Review of post op HEP    Person(s) Educated  Patient    Methods  Explanation;Demonstration    Comprehension  Verbalized understanding;Returned demonstration          PT Long Term Goals - 12/19/17 1024      PT LONG TERM GOAL #1   Title  Patient will demonstrate she has returned to baseline related to shoulder function and ROM.    Time  4    Period  Weeks    Status  On-going      PT LONG TERM GOAL #2   Title  Patient will increase right shoulder active flexion ROM to >/= 150 degrees for increased ease reaching overhead.    Time  4    Period  Weeks    Status  New      PT LONG TERM GOAL #3   Title  Patient will increase right shoulder active abduction ROM to >/= 150 degrees for increased ease reaching overhead.    Time   4    Period  Weeks    Status  New      PT LONG TERM GOAL #4   Title  Patient will reduce quick DASH score to </= 20 for increased shoulder function.    Time  4    Period  Weeks    Status  New      PT LONG TERM GOAL #5   Title  Patient will verbalize understanding of risk reduction practices related to lymphedema.    Time  4  Period  Weeks    Status  New      Breast Clinic Goals - 11/13/17 1121      Patient will be able to verbalize understanding of pertinent lymphedema risk reduction practices relevant to her diagnosis specifically related to skin care.   Time  1    Period  Days    Status  Achieved      Patient will be able to return demonstrate and/or verbalize understanding of the post-op home exercise program related to regaining shoulder range of motion.   Time  1    Period  Days    Status  Achieved      Patient will be able to verbalize understanding of the importance of attending the postoperative After Breast Cancer Class for further lymphedema risk reduction education and therapeutic exercise.   Time  1    Period  Days    Status  Achieved            Plan - 12/19/17 1019    Clinical Impression Statement  Patient is doing well s/p right mastectomy and sentinel node biopsy (1/5 nodes positive) on 12/05/17. She has increased limited ROM compared to last week per her report. She has significant edema present on lateral right trunk just inferior to axilla. There is one area (see photo in note) that is open in the middle of her incision. She will benefit from PT to improve ROM and decrease edema. She believes her incision was completely closed when she saw the physician on 12/17/17. Will notify surgeon of this change.    Rehab Potential  Excellent    Clinical Impairments Affecting Rehab Potential  None    PT Frequency  2x / week    PT Duration  4 weeks    PT Treatment/Interventions  ADLs/Self Care Home Management;Therapeutic exercise;Therapeutic  activities;Patient/family education;Manual techniques;Manual lymph drainage;Scar mobilization;Passive range of motion    PT Next Visit Plan  See what MD says about open area on incision; in-basket sent to MD about this. Begin PROM and ROM exercises    PT Home Exercise Plan  Shoulder ROM HEP    Consulted and Agree with Plan of Care  Patient       Patient will benefit from skilled therapeutic intervention in order to improve the following deficits and impairments:  Pain, Postural dysfunction, Decreased range of motion, Decreased knowledge of precautions, Impaired UE functional use, Increased fascial restricitons, Decreased scar mobility, Decreased strength  Visit Diagnosis: Malignant neoplasm of lower-outer quadrant of right breast of female, estrogen receptor positive ([place]) - Plan: PT plan of care cert/re-cert  Abnormal posture - Plan: PT plan of care cert/re-cert  Aftercare following surgery for neoplasm - Plan: PT plan of care cert/re-cert  Stiffness of right shoulder, not elsewhere classified - Plan: PT plan of care cert/re-cert  Pain in right upper arm - Plan: PT plan of care cert/re-cert     Problem List Patient Active Problem List   Diagnosis Date Noted  . Genetic testing 12/06/2017  . Breast cancer, stage 2, right ([place]) 12/05/2017  . Family history of breast cancer   . Family history of colon cancer   . Family history of pancreatic cancer   . Malignant neoplasm of lower-outer quadrant of right breast of female, estrogen receptor positive ([place]) 11/08/2017  . Paresthesia 02/29/2016  . Obesity 02/29/2016  . Abdominal pain, right upper quadrant 10/05/2015  . Left knee pain 12/05/2012  . Allergic rhinitis 11/27/2011  . Cough 11/18/2011  Annia Friendly, [place] 12/19/17 10:35 AM  [place], [place], [address] Phone: [phone]   Fax:  [phone]  Name: Erica Higgins MRN: [id number] Date of  Birth: 1971-06-25

## 2017-12-23 ENCOUNTER — Ambulatory Visit: Admitting: Rehabilitation

## 2017-12-23 ENCOUNTER — Encounter: Payer: Self-pay | Admitting: Rehabilitation

## 2017-12-23 DIAGNOSIS — C50511 Malignant neoplasm of lower-outer quadrant of right female breast: Secondary | ICD-10-CM

## 2017-12-23 DIAGNOSIS — M25611 Stiffness of right shoulder, not elsewhere classified: Secondary | ICD-10-CM

## 2017-12-23 DIAGNOSIS — Z483 Aftercare following surgery for neoplasm: Secondary | ICD-10-CM

## 2017-12-23 DIAGNOSIS — M79621 Pain in right upper arm: Secondary | ICD-10-CM

## 2017-12-23 DIAGNOSIS — Z170 Estrogen receptor positive status [ER+]: Principal | ICD-10-CM

## 2017-12-23 DIAGNOSIS — R293 Abnormal posture: Secondary | ICD-10-CM

## 2017-12-23 NOTE — Therapy (Signed)
[facility] [address] [place], [place], [address] Phone: [phone]   Fax:  [phone]  Physical Therapy Treatment  Patient Details  Name: Erica Higgins MRN: [id number] Date of Birth: 01/19/1971 Referring Provider: Dr. Thomas Cornett   Encounter Date: 12/23/2017  PT End of Session - 12/23/17 1650    Visit Number  3    Number of Visits  9    Date for PT Re-Evaluation  01/16/18    PT Start Time  1600    PT Stop Time  1644    PT Time Calculation (min)  44 min    Activity Tolerance  Patient tolerated treatment well    Behavior During Therapy  WFL for tasks assessed/performed       Past Medical History:  Diagnosis Date  . Abdominal pain, acute, right upper quadrant   . Anxiety   . Back pain   . Depression   . Joint pain   . Migraine   . Obesity   . Pain    Toes on bilateral feet  . Polycystic ovarian disease   . Sleep apnea     Past Surgical History:  Procedure Laterality Date  . CESAREAN SECTION    . MINISCUS REPAIR  2003 and 2005  . SIMPLE MASTECTOMY WITH AXILLARY SENTINEL NODE BIOPSY Right 12/05/2017   Procedure: SIMPLE MASTECTOMY WITH WIRE LOCALIZATION AND  TARGETED RIGHT AXILLARY SENTINEL NODE MAPPING  AND BIOPSY ERAS PATHWAY;  Surgeon: Cornett, Thomas, MD;  Location: [facility];  Service: General;  Laterality: Right;  PECTORAL BLOCK  . TONSILLECTOMY  1996    There were no vitals filed for this visit.  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  Wore her compression all day at work.  work was more exhausting and not as much painful.  Feels like the incision is not any more open than it was last time but is it maybe a bit red.  Will see the MD tomorrow at 9am regarding incision and possible draining.  Reports pectoralis pain with head turns today and does have a history of muscle spasms here.  Does report she almost fell on the  stairs and grabbed the rail with the Rt UE    Pertinent History  Patient was diagnosed on 11/01/17 with right grade 2 invasive ductal carcinoma breast cancer. It is ER/PR positive an HER2 negative with a ki67 of 2%. She has no other health problems. Patient underwent a right mastectomy and sentinel node biopsy (1/5 positive nodes) on 12/05/17.     Patient Stated Goals  Make my arm back to normal    Currently in Pain?  Yes    Pain Score  3     Pain Location  -- Rt breast, pectoralis area, upper arm    Pain Orientation  Right    Pain Descriptors / Indicators  Aching         [facility] PT Assessment - 12/23/17 0001      Observation/Other Assessments   Observations  incision looks similar to eval picture with small open spot patient will be seeing MD tomorrow regarding incision and fluid      AROM   Overall AROM Comments  pulling and increased pain into Lt sternal region of the pectoralis with cervical extension, lateral flexion L and rotation right                     [facility] Adult PT Treatment/Exercise - 12/23/17 0001      Exercises   Exercises  Shoulder      Shoulder Exercises: ROM/Strengthening   Other ROM/Strengthening Exercises  supine dowel flexion, horizontal abduction, and ER x 10 for HEP education    Other ROM/Strengthening Exercises  butterfly shoulder stretch 2x30"       Manual Therapy   Manual Therapy  Manual Traction;Passive ROM    Passive ROM  Lt shoulder to tolerance flexion, abduction, ER at 45degrees of abduction             PT Education - 12/23/17 1649    Education provided  Yes    Education Details  cording, AAROM HEP per handout    Person(s) Educated  Patient    Methods  Explanation;Demonstration;Verbal cues;Handout    Comprehension  Verbalized understanding;Returned demonstration          PT Long Term Goals - 12/19/17 1024      PT LONG TERM GOAL #1   Title  Patient will demonstrate she has returned to baseline related to shoulder function and  ROM.    Time  4    Period  Weeks    Status  On-going      PT LONG TERM GOAL #2   Title  Patient will increase right shoulder active flexion ROM to >/= 150 degrees for increased ease reaching overhead.    Time  4    Period  Weeks    Status  New      PT LONG TERM GOAL #3   Title  Patient will increase right shoulder active abduction ROM to >/= 150 degrees for increased ease reaching overhead.    Time  4    Period  Weeks    Status  New      PT LONG TERM GOAL #4   Title  Patient will reduce quick DASH score to </= 20 for increased shoulder function.    Time  4    Period  Weeks    Status  New      PT LONG TERM GOAL #5   Title  Patient will verbalize understanding of risk reduction practices related to lymphedema.    Time  4    Period  Weeks    Status  New      Breast Clinic Goals - 11/13/17 1121      Patient will be able to verbalize understanding of pertinent lymphedema risk reduction practices relevant to her diagnosis specifically related to skin care.   Time  1    Period  Days    Status  Achieved      Patient will be able to return demonstrate and/or verbalize understanding of the post-op home exercise program related to regaining shoulder range of motion.   Time  1    Period  Days    Status  Achieved      Patient will be able to verbalize understanding of the importance of attending the postoperative After Breast Cancer Class for further lymphedema risk reduction education and therapeutic exercise.   Time  1    Period  Days    Status  Achieved           Plan - 12/23/17 1650    Clinical Impression Statement  Pt continues with small open region on the lateral mastectomy incision covered today with dressing.  Will be seeing MD tomorrow.  Tolerated all PROM/AAROM without significant pain.  Most pain today is tenderness to palpation   of the sternal region/pectoralis belly. Also pain here with cervical AROM and reports she does take muscle relaxer for spasms here.  Also  reports pain in the medial upper arm with PROM similar to cording but no cording visible or palpable.  Was able to return to work today needing assistance only with removing the velcro on the blood pressure cuffs    Clinical Presentation  Stable    Clinical Impairments Affecting Rehab Potential  None    PT Frequency  2x / week    PT Duration  4 weeks    PT Treatment/Interventions  ADLs/Self Care Home Management;Therapeutic exercise;Therapeutic activities;Patient/family education;Manual techniques;Manual lymph drainage;Scar mobilization;Passive range of motion    PT Next Visit Plan  See what MD says about open area on incision Begin PROM and ROM exercises    Consulted and Agree with Plan of Care  Patient       Patient will benefit from skilled therapeutic intervention in order to improve the following deficits and impairments:  Pain, Postural dysfunction, Decreased range of motion, Decreased knowledge of precautions, Impaired UE functional use, Increased fascial restricitons, Decreased scar mobility, Decreased strength  Visit Diagnosis: Malignant neoplasm of lower-outer quadrant of right breast of female, estrogen receptor positive ([facility])  Abnormal posture  Aftercare following surgery for neoplasm  Stiffness of right shoulder, not elsewhere classified  Pain in right upper arm     Problem List Patient Active Problem List   Diagnosis Date Noted  . Genetic testing 12/06/2017  . Breast cancer, stage 2, right ([place]) 12/05/2017  . Family history of breast cancer   . Family history of colon cancer   . Family history of pancreatic cancer   . Malignant neoplasm of lower-outer quadrant of right breast of female, estrogen receptor positive ([place]) 11/08/2017  . Paresthesia 02/29/2016  . Obesity 02/29/2016  . Abdominal pain, right upper quadrant 10/05/2015  . Left knee pain 12/05/2012  . Allergic rhinitis 11/27/2011  . Cough 11/18/2011    Shan Levans, PT  12/23/2017, 4:54 PM  [place], [place], [address] Phone: [phone]   Fax:  [phone]  Name: Erica Higgins MRN: [id number] Date of Birth: Jan 15, 1971

## 2017-12-23 NOTE — Patient Instructions (Signed)
Axillary web syndrome (also called cording) can happen after having breast cancer surgery when lymph nodes in the armpit are removed. It presents as if you have a thin cord in your arm and can run from the armpit all the way down into the forearm. If you've had a sentinel node biopsy, the risk is 1-20% and if you've had an axillary lymph node dissection (more than 7 nodes removed), the risk is 36-72%. The ranges vary depending on the research study.  It most often happens 3-4 weeks post-op but can happen sooner or later. There are several possibilities for what cording actually is. Although no one knows for sure as of yet, it may be related to lymphatics, veins, or other tissue. Sometimes cording resolves on its own but other times it requires physical therapy with a therapist who specializes in lymphedema and/or cancer rehab. Treatment typically involves stretching, manual techniques, and exercise. Sometimes cords get "released" while stretching or during manual treatment and the patient may experience the sensation of a "pop." This may feel strange but it is not dangerous and is a sign that the cord has released; range of motion may be improved in the process.  SHOULDER: Flexion - Supine (Cane)        Cancer Rehab [phone]    Hold cane in both hands. Raise arms up overhead. Do not allow back to arch. Hold _5__ seconds. Do __5-10__ times; __1-2__ times a day.   SELF ASSISTED WITH OBJECT: Shoulder Abduction / Adduction - Supine    Hold cane with both hands. Move both arms from side to side, keep elbows straight.  Hold when stretch felt for __5__ seconds. Repeat __5-10__ times; __1-2__ times a day. Once this becomes easier progress to third picture bringing affected arm towards ear by staying out to side. Same hold for _5_seconds. Repeat  _5-10_ times, _1-2_ times/day.  Shoulder Blade Stretch    Clasp fingers behind head with elbows touching in front of face. Pull elbows back while pressing  shoulder blades together. Relax and hold as tolerated, can place pillow under elbow here for comfort as needed and to allow for prolonged stretch.  Repeat __5__ times. Do __1-2__ sessions per day.     SHOULDER: External Rotation - Supine (Cane)    Hold cane with both hands. Rotate arm away from body. Keep elbow on floor and next to body. _5-10__ reps per set, hold 5 seconds, _1-2__ sets per day. Add towel to keep elbow at side.  Copyright  VHI. All rights reserved.

## 2017-12-24 ENCOUNTER — Telehealth: Payer: Self-pay | Admitting: *Deleted

## 2017-12-24 NOTE — Telephone Encounter (Signed)
Received Mammaprint result of Low Risk. Physician team notified. Called pt with results and discussed no chemo. Informed next step is xrt. Received verbal understanding. Pt will have xrt at [facility].

## 2017-12-25 ENCOUNTER — Encounter: Payer: Self-pay | Admitting: Rehabilitation

## 2017-12-25 ENCOUNTER — Ambulatory Visit: Admitting: Rehabilitation

## 2017-12-25 ENCOUNTER — Encounter (HOSPITAL_COMMUNITY): Payer: Self-pay

## 2017-12-25 DIAGNOSIS — M25611 Stiffness of right shoulder, not elsewhere classified: Secondary | ICD-10-CM

## 2017-12-25 DIAGNOSIS — M79621 Pain in right upper arm: Secondary | ICD-10-CM

## 2017-12-25 DIAGNOSIS — C50511 Malignant neoplasm of lower-outer quadrant of right female breast: Secondary | ICD-10-CM | POA: Diagnosis not present

## 2017-12-25 DIAGNOSIS — R293 Abnormal posture: Secondary | ICD-10-CM

## 2017-12-25 DIAGNOSIS — Z483 Aftercare following surgery for neoplasm: Secondary | ICD-10-CM

## 2017-12-25 DIAGNOSIS — Z170 Estrogen receptor positive status [ER+]: Principal | ICD-10-CM

## 2017-12-25 NOTE — Therapy (Signed)
[place], [place], [address] Phone: [phone]   Fax:  [phone]  Physical Therapy Treatment  Patient Details  Name: Erica Higgins MRN: [id number] Date of Birth: 03/20/1971 Referring Provider: Dr. Erroll Luna   Encounter Date: 12/25/2017  PT End of Session - 12/25/17 1600    Visit Number  4    Number of Visits  9    Date for PT Re-Evaluation  01/16/18    PT Start Time  1017    PT Stop Time  1555    PT Time Calculation (min)  40 min    Activity Tolerance  Patient tolerated treatment well    Behavior During Therapy  [facility] for tasks assessed/performed       Past Medical History:  Diagnosis Date  . Abdominal pain, acute, right upper quadrant   . Anxiety   . Back pain   . Depression   . Joint pain   . Migraine   . Obesity   . Pain    Toes on bilateral feet  . Polycystic ovarian disease   . Sleep apnea     Past Surgical History:  Procedure Laterality Date  . CESAREAN SECTION    . MINISCUS REPAIR  2003 and 2005  . SIMPLE MASTECTOMY WITH AXILLARY SENTINEL NODE BIOPSY Right 12/05/2017   Procedure: SIMPLE MASTECTOMY WITH WIRE LOCALIZATION AND  TARGETED RIGHT AXILLARY SENTINEL NODE MAPPING  AND BIOPSY ERAS PATHWAY;  Surgeon: Erroll Luna, MD;  Location: [place];  Service: General;  Laterality: Right;  PECTORAL BLOCK  . TONSILLECTOMY  1996    There were no vitals filed for this visit.  Subjective Assessment - 12/25/17 1517    Subjective  A little sore today.  Muscle spasms have been occuring after working 5 hours today. Say Dr. Marlou Starks who drained 223m from the Lt breast.  Reports he mentioned not to lift the arm overhead and pt was unsure why, she will talk to the regular surgeon monday about if she really has restrictions.  [facility]  Does not have to have chemotherapy    Pertinent History  Patient was diagnosed on 11/01/17 with  right grade 2 invasive ductal carcinoma breast cancer. It is ER/PR positive an HER2 negative with a ki67 of 2%. She has no other health problems. Patient underwent a right mastectomy and sentinel node biopsy (1/5 positive nodes) on 12/05/17.     Patient Stated Goals  Make my arm back to normal    Currently in Pain?  Yes    Pain Score  1     Pain Location  Arm    Pain Orientation  Right    Pain Descriptors / Indicators  Aching    Pain Type  Surgical pain    Pain Onset  1 to 4 weeks ago    Pain Frequency  Intermittent    Aggravating Factors   raising arm up    Pain Relieving Factors  rest                       [facility] Adult PT Treatment/Exercise - 12/25/17 0001      Shoulder Exercises: Pulleys   Flexion  1 minute    Scaption  1 minute      Manual Therapy   Manual Therapy  Soft tissue mobilization    Soft tissue mobilization  to medial Rt upper arm in median  nerve stretched position    Passive ROM  Rt shoulder to tolerance flexion, abduction, ER at 45deg of abduction (last note should note Rt shoulder)                  PT Long Term Goals - 12/25/17 1709      PT LONG TERM GOAL #1   Title  Patient will demonstrate she has returned to baseline related to shoulder function and ROM.    Status  Not Met      PT LONG TERM GOAL #2   Title  Patient will increase right shoulder active flexion ROM to >/= 150 degrees for increased ease reaching overhead.    Status  On-going      PT LONG TERM GOAL #3   Title  Patient will increase right shoulder active abduction ROM to >/= 150 degrees for increased ease reaching overhead.    Status  On-going      PT LONG TERM GOAL #4   Title  Patient will reduce quick DASH score to </= 20 for increased shoulder function.    Status  On-going      PT LONG TERM GOAL #5   Title  Patient will verbalize understanding of risk reduction practices related to lymphedema.    Status  On-going      Breast Clinic Goals - 11/13/17 1121       Patient will be able to verbalize understanding of pertinent lymphedema risk reduction practices relevant to her diagnosis specifically related to skin care.   Time  1    Period  Days    Status  Achieved      Patient will be able to return demonstrate and/or verbalize understanding of the post-op home exercise program related to regaining shoulder range of motion.   Time  1    Period  Days    Status  Achieved      Patient will be able to verbalize understanding of the importance of attending the postoperative After Breast Cancer Class for further lymphedema risk reduction education and therapeutic exercise.   Time  1    Period  Days    Status  Achieved           Plan - 12/25/17 1601    Clinical Impression Statement  Pt arrives today after draining 26m of fluid from the Rt breast.  She reports still having sternal region muscle spasms and has been having to have people assist her with blood pressure and injections at work.  Pt finding out she does not need to have chemotherapy but will begin radiation soon.  Due to patient mentioning that the surgeon she saw yesterday wanted her to have ROM restrictions we focused on gentle ROM today only.  She will be seeing her regular surgeon Monday and will discuss this remark with him.      Rehab Potential  Excellent    Clinical Impairments Affecting Rehab Potential  None    PT Frequency  2x / week    PT Duration  4 weeks    PT Treatment/Interventions  ADLs/Self Care Home Management;Therapeutic exercise;Therapeutic activities;Patient/family education;Manual techniques;Manual lymph drainage;Scar mobilization;Passive range of motion    PT Next Visit Plan  what did surgeon say about motion restrictions/work restrictions?  continue Rt shoulder ROM for radiation preparation, radiation scheduled?       Patient will benefit from skilled therapeutic intervention in order to improve the following deficits and impairments:  Pain, Postural dysfunction,  Decreased range of motion,  Decreased knowledge of precautions, Impaired UE functional use, Increased fascial restricitons, Decreased scar mobility, Decreased strength  Visit Diagnosis: Malignant neoplasm of lower-outer quadrant of right breast of female, estrogen receptor positive (HCC)  Abnormal posture  Aftercare following surgery for neoplasm  Stiffness of right shoulder, not elsewhere classified  Pain in right upper arm     Problem List Patient Active Problem List   Diagnosis Date Noted  . Genetic testing 12/06/2017  . Breast cancer, stage 2, right ([place]) 12/05/2017  . Family history of breast cancer   . Family history of colon cancer   . Family history of pancreatic cancer   . Malignant neoplasm of lower-outer quadrant of right breast of female, estrogen receptor positive ([place]) 11/08/2017  . Paresthesia 02/29/2016  . Obesity 02/29/2016  . Abdominal pain, right upper quadrant 10/05/2015  . Left knee pain 12/05/2012  . Allergic rhinitis 11/27/2011  . Cough 11/18/2011    Shan Levans, PT  12/25/2017, 5:10 PM  [place], [place], [address] Phone: [phone]   Fax:  [phone]  Name: Armonie Hijazi MRN: [id number] Date of Birth: 05-Feb-1971

## 2017-12-30 ENCOUNTER — Encounter: Payer: Self-pay | Admitting: Rehabilitation

## 2017-12-30 ENCOUNTER — Ambulatory Visit: Admitting: Rehabilitation

## 2017-12-30 DIAGNOSIS — Z483 Aftercare following surgery for neoplasm: Secondary | ICD-10-CM

## 2017-12-30 DIAGNOSIS — C50511 Malignant neoplasm of lower-outer quadrant of right female breast: Secondary | ICD-10-CM

## 2017-12-30 DIAGNOSIS — Z170 Estrogen receptor positive status [ER+]: Principal | ICD-10-CM

## 2017-12-30 DIAGNOSIS — R293 Abnormal posture: Secondary | ICD-10-CM

## 2017-12-30 DIAGNOSIS — M79621 Pain in right upper arm: Secondary | ICD-10-CM

## 2017-12-30 DIAGNOSIS — M25611 Stiffness of right shoulder, not elsewhere classified: Secondary | ICD-10-CM

## 2017-12-30 NOTE — Therapy (Signed)
[place], [place], [address] Phone: [phone]   Fax:  [phone]  Physical Therapy Treatment  Patient Details  Name: Erica Higgins MRN: [id number] Date of Birth: Nov 14, 1970 Referring Provider: Dr. Erroll Luna   Encounter Date: 12/30/2017  PT End of Session - 12/30/17 1712    Visit Number  5    Number of Visits  9    Date for PT Re-Evaluation  01/16/18    PT Start Time  1604    PT Stop Time  1645    PT Time Calculation (min)  41 min    Activity Tolerance  Patient tolerated treatment well    Behavior During Therapy  [facility] for tasks assessed/performed       Past Medical History:  Diagnosis Date  . Abdominal pain, acute, right upper quadrant   . Anxiety   . Back pain   . Depression   . Joint pain   . Migraine   . Obesity   . Pain    Toes on bilateral feet  . Polycystic ovarian disease   . Sleep apnea     Past Surgical History:  Procedure Laterality Date  . CESAREAN SECTION    . MINISCUS REPAIR  2003 and 2005  . SIMPLE MASTECTOMY WITH AXILLARY SENTINEL NODE BIOPSY Right 12/05/2017   Procedure: SIMPLE MASTECTOMY WITH WIRE LOCALIZATION AND  TARGETED RIGHT AXILLARY SENTINEL NODE MAPPING  AND BIOPSY ERAS PATHWAY;  Surgeon: Erroll Luna, MD;  Location: [place];  Service: General;  Laterality: Right;  PECTORAL BLOCK  . TONSILLECTOMY  1996    There were no vitals filed for this visit.  Subjective Assessment - 12/30/17 1606    Subjective  Established MD said to continue with therapy and no restriction except lifting 40#. "He wasn't happy with the ROM of the arm on Monday".  Tight today.  Starts radiation in 2-3 weeks after arm is moving better.  Taking muscle relaxer everyday due to pectoralis spasm.      Pertinent History  Patient was diagnosed on 11/01/17 with right grade 2 invasive ductal carcinoma breast cancer. It is ER/PR positive an HER2 negative with a ki67 of 2%. She has  no other health problems. Patient underwent a right mastectomy and sentinel node biopsy (1/5 positive nodes) on 12/05/17.     Patient Stated Goals  Make my arm back to normal    Currently in Pain?  No/denies    Pain Score  2     Pain Orientation  Right    Pain Descriptors / Indicators  Aching    Pain Type  Surgical pain    Pain Onset  More than a month ago    Pain Frequency  Intermittent    Aggravating Factors   raising arm up    Pain Relieving Factors  rest         [facility] PT Assessment - 12/30/17 0001      AROM   Right Shoulder Flexion  132 Degrees pn    Right Shoulder ABduction  120 Degrees pn    Right Shoulder Internal Rotation  80 Degrees    Right Shoulder External Rotation  82 Degrees                   [facility] Adult PT Treatment/Exercise - 12/30/17 0001      Shoulder Exercises: Supine   Flexion  Right;10 reps;AROM    Diagonals  Right;10 reps;AROM      Shoulder  Exercises: Seated   Retraction  Both;10 reps      Shoulder Exercises: Pulleys   Flexion  2 minutes    Scaption  2 minutes      Shoulder Exercises: Therapy Ball   Flexion  Right;5 reps yellow ball      Manual Therapy   Soft tissue mobilization  to medial Rt upper arm in median nerve stretched position    Passive ROM  Rt shoulder to tolerance flexion, abduction, ER at 45deg of abduction (last note should note Rt shoulder)                  PT Long Term Goals - 12/30/17 1714      PT LONG TERM GOAL #1   Title  Patient will demonstrate she has returned to baseline related to shoulder function and ROM.    Status  On-going      PT LONG TERM GOAL #2   Title  Patient will increase right shoulder active flexion ROM to >/= 150 degrees for increased ease reaching overhead.    Status  Partially Met      PT LONG TERM GOAL #3   Title  Patient will increase right shoulder active abduction ROM to >/= 150 degrees for increased ease reaching overhead.    Status  Partially Met      PT LONG TERM GOAL  #4   Title  Patient will reduce quick DASH score to </= 20 for increased shoulder function.    Status  On-going      PT LONG TERM GOAL #5   Title  Patient will verbalize understanding of risk reduction practices related to lymphedema.    Status  On-going      Breast Clinic Goals - 11/13/17 1121      Patient will be able to verbalize understanding of pertinent lymphedema risk reduction practices relevant to her diagnosis specifically related to skin care.   Time  1    Period  Days    Status  Achieved      Patient will be able to return demonstrate and/or verbalize understanding of the post-op home exercise program related to regaining shoulder range of motion.   Time  1    Period  Days    Status  Achieved      Patient will be able to verbalize understanding of the importance of attending the postoperative After Breast Cancer Class for further lymphedema risk reduction education and therapeutic exercise.   Time  1    Period  Days    Status  Achieved           Plan - 12/30/17 1712    Clinical Impression Statement  Improved ROM status today with reassurance from MD that she does not have to restrict movement and from starting to take the muscle relaxers on a more consistent basis.  She continues to need help and work due to pain.  She will begin radiation in 2-3 weeks when the arm is moving better.      Rehab Potential  Excellent    Clinical Impairments Affecting Rehab Potential  None    PT Frequency  2x / week    PT Duration  4 weeks    PT Treatment/Interventions  ADLs/Self Care Home Management;Therapeutic exercise;Therapeutic activities;Patient/family education;Manual techniques;Manual lymph drainage;Scar mobilization;Passive range of motion    PT Next Visit Plan  continue Rt shoulder ROM for radiation position and ADLs       Patient will benefit from skilled therapeutic  intervention in order to improve the following deficits and impairments:  Pain, Postural dysfunction,  Decreased range of motion, Decreased knowledge of precautions, Impaired UE functional use, Increased fascial restricitons, Decreased scar mobility, Decreased strength  Visit Diagnosis: Malignant neoplasm of lower-outer quadrant of right breast of female, estrogen receptor positive (HCC)  Abnormal posture  Aftercare following surgery for neoplasm  Stiffness of right shoulder, not elsewhere classified  Pain in right upper arm     Problem List Patient Active Problem List   Diagnosis Date Noted  . Genetic testing 12/06/2017  . Breast cancer, stage 2, right ([place]) 12/05/2017  . Family history of breast cancer   . Family history of colon cancer   . Family history of pancreatic cancer   . Malignant neoplasm of lower-outer quadrant of right breast of female, estrogen receptor positive ([place]) 11/08/2017  . Paresthesia 02/29/2016  . Obesity 02/29/2016  . Abdominal pain, right upper quadrant 10/05/2015  . Left knee pain 12/05/2012  . Allergic rhinitis 11/27/2011  . Cough 11/18/2011    Shan Levans, PT 12/30/2017, 5:15 PM  [place], [place], [address] Phone: [phone]   Fax:  [phone]  Name: Erica Higgins MRN: [id number] Date of Birth: 08-12-1971

## 2018-01-01 ENCOUNTER — Encounter: Payer: Self-pay | Admitting: Rehabilitation

## 2018-01-01 ENCOUNTER — Ambulatory Visit: Attending: Surgery | Admitting: Rehabilitation

## 2018-01-01 DIAGNOSIS — M79621 Pain in right upper arm: Secondary | ICD-10-CM | POA: Diagnosis present

## 2018-01-01 DIAGNOSIS — Z483 Aftercare following surgery for neoplasm: Secondary | ICD-10-CM | POA: Insufficient documentation

## 2018-01-01 DIAGNOSIS — Z170 Estrogen receptor positive status [ER+]: Secondary | ICD-10-CM | POA: Diagnosis present

## 2018-01-01 DIAGNOSIS — R293 Abnormal posture: Secondary | ICD-10-CM | POA: Insufficient documentation

## 2018-01-01 DIAGNOSIS — C50511 Malignant neoplasm of lower-outer quadrant of right female breast: Secondary | ICD-10-CM | POA: Diagnosis not present

## 2018-01-01 DIAGNOSIS — M25611 Stiffness of right shoulder, not elsewhere classified: Secondary | ICD-10-CM | POA: Insufficient documentation

## 2018-01-01 NOTE — Therapy (Signed)
[place], [place], [address] Phone: [phone]   Fax:  [phone]  Physical Therapy Treatment  Patient Details  Name: Erica Higgins MRN: [id number] Date of Birth: 1970/12/15 Referring Provider: Dr. Erroll Luna   Encounter Date: 01/01/2018  PT End of Session - 01/01/18 1559    Visit Number  6    Number of Visits  9    Date for PT Re-Evaluation  01/16/18    PT Start Time  1520    PT Stop Time  1600    PT Time Calculation (min)  40 min    Activity Tolerance  Patient tolerated treatment well    Behavior During Therapy  [facility] for tasks assessed/performed       Past Medical History:  Diagnosis Date  . Abdominal pain, acute, right upper quadrant   . Anxiety   . Back pain   . Depression   . Joint pain   . Migraine   . Obesity   . Pain    Toes on bilateral feet  . Polycystic ovarian disease   . Sleep apnea     Past Surgical History:  Procedure Laterality Date  . CESAREAN SECTION    . MINISCUS REPAIR  2003 and 2005  . SIMPLE MASTECTOMY WITH AXILLARY SENTINEL NODE BIOPSY Right 12/05/2017   Procedure: SIMPLE MASTECTOMY WITH WIRE LOCALIZATION AND  TARGETED RIGHT AXILLARY SENTINEL NODE MAPPING  AND BIOPSY ERAS PATHWAY;  Surgeon: Erroll Luna, MD;  Location: [place];  Service: General;  Laterality: Right;  PECTORAL BLOCK  . TONSILLECTOMY  1996    There were no vitals filed for this visit.  Subjective Assessment - 01/01/18 1521    Subjective  Really tired today.  Having the muscle spams bad today.  Starts Rad[facility]/15/18 [facility] in [place]    Pertinent History  Patient was diagnosed on 11/01/17 with right grade 2 invasive ductal carcinoma breast cancer. It is ER/PR positive an HER2 negative with a ki67 of 2%. She has no other health problems. Patient underwent a right mastectomy and sentinel node biopsy (1/5 positive nodes) on 12/05/17.     Patient Stated Goals  Make my arm back to normal     Currently in Pain?  Yes    Pain Score  2     Pain Location  Arm    Pain Orientation  Right    Pain Descriptors / Indicators  Aching    Pain Onset  More than a month ago    Pain Frequency  Intermittent    Aggravating Factors   raising arm up    Pain Relieving Factors  rest                       [facility] Adult PT Treatment/Exercise - 01/01/18 0001      Shoulder Exercises: Standing   Row  Both;12 reps;Theraband    Theraband Level (Shoulder Row)  Level 2 (Red)      Shoulder Exercises: Pulleys   Flexion  2 minutes    Scaption  2 minutes      Shoulder Exercises: Therapy Ball   Flexion  Right;5 reps yellow ball      Manual Therapy   Soft tissue mobilization  to medial Rt upper arm in median nerve stretched position, attempted some STM to pectoralis near insertion/clavicle but not tolerating much    Passive ROM  Rt shoulder to tolerance flexion, abduction, ER at 45deg of abduction (  last note should note Rt shoulder)                  PT Long Term Goals - 01/01/18 1601      PT LONG TERM GOAL #1   Title  Patient will demonstrate she has returned to baseline related to shoulder function and ROM.    Status  On-going      PT LONG TERM GOAL #2   Title  Patient will increase right shoulder active flexion ROM to >/= 150 degrees for increased ease reaching overhead.    Status  On-going      PT LONG TERM GOAL #3   Title  Patient will increase right shoulder active abduction ROM to >/= 150 degrees for increased ease reaching overhead.    Status  On-going      PT LONG TERM GOAL #4   Title  Patient will reduce quick DASH score to </= 20 for increased shoulder function.    Status  On-going      PT LONG TERM GOAL #5   Title  Patient will verbalize understanding of risk reduction practices related to lymphedema.    Status  On-going      Breast Clinic Goals - 11/13/17 1121      Patient will be able to verbalize understanding of pertinent lymphedema risk  reduction practices relevant to her diagnosis specifically related to skin care.   Time  1    Period  Days    Status  Achieved      Patient will be able to return demonstrate and/or verbalize understanding of the post-op home exercise program related to regaining shoulder range of motion.   Time  1    Period  Days    Status  Achieved      Patient will be able to verbalize understanding of the importance of attending the postoperative After Breast Cancer Class for further lymphedema risk reduction education and therapeutic exercise.   Time  1    Period  Days    Status  Achieved           Plan - 01/01/18 1600    Clinical Impression Statement  Pt with improved ROM today, continues with tightness into upper medial arm with active arm movements, pectoralis muscle spasms and tenderness here.      Rehab Potential  Excellent    PT Frequency  2x / week    PT Duration  4 weeks    PT Treatment/Interventions  ADLs/Self Care Home Management;Therapeutic exercise;Therapeutic activities;Patient/family education;Manual techniques;Manual lymph drainage;Scar mobilization;Passive range of motion    PT Next Visit Plan  continue Rt shoulder ROM for radiation position and ADLs, scar check       Patient will benefit from skilled therapeutic intervention in order to improve the following deficits and impairments:  Pain, Postural dysfunction, Decreased range of motion, Decreased knowledge of precautions, Impaired UE functional use, Increased fascial restricitons, Decreased scar mobility, Decreased strength  Visit Diagnosis: Malignant neoplasm of lower-outer quadrant of right breast of female, estrogen receptor positive (HCC)  Abnormal posture  Aftercare following surgery for neoplasm  Stiffness of right shoulder, not elsewhere classified  Pain in right upper arm     Problem List Patient Active Problem List   Diagnosis Date Noted  . Genetic testing 12/06/2017  . Breast cancer, stage 2, right  ([place]) 12/05/2017  . Family history of breast cancer   . Family history of colon cancer   . Family history of pancreatic cancer   .  Malignant neoplasm of lower-outer quadrant of right breast of female, estrogen receptor positive ([place]) 11/08/2017  . Paresthesia 02/29/2016  . Obesity 02/29/2016  . Abdominal pain, right upper quadrant 10/05/2015  . Left knee pain 12/05/2012  . Allergic rhinitis 11/27/2011  . Cough 11/18/2011    Shan Levans, PT 01/01/2018, 4:03 PM  [place], [place], [address] Phone: [phone]   Fax:  [phone]  Name: Jarielys Bosher MRN: [id number] Date of Birth: Jun 16, 1971

## 2018-01-06 ENCOUNTER — Encounter: Payer: Self-pay | Admitting: Physical Therapy

## 2018-01-06 ENCOUNTER — Ambulatory Visit: Admitting: Physical Therapy

## 2018-01-06 DIAGNOSIS — Z483 Aftercare following surgery for neoplasm: Secondary | ICD-10-CM

## 2018-01-06 DIAGNOSIS — M25611 Stiffness of right shoulder, not elsewhere classified: Secondary | ICD-10-CM

## 2018-01-06 DIAGNOSIS — R293 Abnormal posture: Secondary | ICD-10-CM

## 2018-01-06 DIAGNOSIS — M79621 Pain in right upper arm: Secondary | ICD-10-CM

## 2018-01-06 DIAGNOSIS — C50511 Malignant neoplasm of lower-outer quadrant of right female breast: Secondary | ICD-10-CM | POA: Diagnosis not present

## 2018-01-06 NOTE — Therapy (Signed)
[place], [place], [address] Phone: [phone]   Fax:  [phone]  Physical Therapy Treatment  Patient Details  Name: Erica Higgins MRN: [id number] Date of Birth: 11/11/1970 Referring Provider: Dr. Erroll Luna   Encounter Date: 01/06/2018  PT End of Session - 01/06/18 1103    Visit Number  7    Number of Visits  9    Date for PT Re-Evaluation  01/16/18    PT Start Time  1021    PT Stop Time  1102    PT Time Calculation (min)  41 min    Activity Tolerance  Patient tolerated treatment well    Behavior During Therapy  [facility] for tasks assessed/performed       Past Medical History:  Diagnosis Date  . Abdominal pain, acute, right upper quadrant   . Anxiety   . Back pain   . Depression   . Joint pain   . Migraine   . Obesity   . Pain    Toes on bilateral feet  . Polycystic ovarian disease   . Sleep apnea     Past Surgical History:  Procedure Laterality Date  . CESAREAN SECTION    . MINISCUS REPAIR  2003 and 2005  . SIMPLE MASTECTOMY WITH AXILLARY SENTINEL NODE BIOPSY Right 12/05/2017   Procedure: SIMPLE MASTECTOMY WITH WIRE LOCALIZATION AND  TARGETED RIGHT AXILLARY SENTINEL NODE MAPPING  AND BIOPSY ERAS PATHWAY;  Surgeon: Erroll Luna, MD;  Location: [place];  Service: General;  Laterality: Right;  PECTORAL BLOCK  . TONSILLECTOMY  1996    There were no vitals filed for this visit.  Subjective Assessment - 01/06/18 1022    Subjective  My shoulder feels okay.     Pertinent History  Patient was diagnosed on 11/01/17 with right grade 2 invasive ductal carcinoma breast cancer. It is ER/PR positive an HER2 negative with a ki67 of 2%. She has no other health problems. Patient underwent a right mastectomy and sentinel node biopsy (1/5 positive nodes) on 12/05/17.     Patient Stated Goals  Make my arm back to normal    Currently in Pain?  Yes    Pain Score  2     Pain Location  Arm     Pain Orientation  Right;Upper;Medial    Pain Descriptors / Indicators  Tightness                       [facility] Adult PT Treatment/Exercise - 01/06/18 0001      Shoulder Exercises: Standing   Row  Both;12 reps;Theraband    Theraband Level (Shoulder Row)  Level 2 (Red)      Shoulder Exercises: Pulleys   Flexion  2 minutes    Scaption  2 minutes      Shoulder Exercises: Therapy Ball   Flexion  Right;10 reps      Manual Therapy   Soft tissue mobilization  gently to right pec in area of tightness, also along cording in R upper medial arm    Passive ROM  Rt shoulder to tolerance flexion, abduction, ER - pt demonstrating improved ROM today                  PT Long Term Goals - 01/01/18 1601      PT LONG TERM GOAL #1   Title  Patient will demonstrate she has returned to baseline related to shoulder function and ROM.  Status  On-going      PT LONG TERM GOAL #2   Title  Patient will increase right shoulder active flexion ROM to >/= 150 degrees for increased ease reaching overhead.    Status  On-going      PT LONG TERM GOAL #3   Title  Patient will increase right shoulder active abduction ROM to >/= 150 degrees for increased ease reaching overhead.    Status  On-going      PT LONG TERM GOAL #4   Title  Patient will reduce quick DASH score to </= 20 for increased shoulder function.    Status  On-going      PT LONG TERM GOAL #5   Title  Patient will verbalize understanding of risk reduction practices related to lymphedema.    Status  On-going      Breast Clinic Goals - 11/13/17 1121      Patient will be able to verbalize understanding of pertinent lymphedema risk reduction practices relevant to her diagnosis specifically related to skin care.   Time  1    Period  Days    Status  Achieved      Patient will be able to return demonstrate and/or verbalize understanding of the post-op home exercise program related to regaining shoulder range of motion.    Time  1    Period  Days    Status  Achieved      Patient will be able to verbalize understanding of the importance of attending the postoperative After Breast Cancer Class for further lymphedema risk reduction education and therapeutic exercise.   Time  1    Period  Days    Status  Achieved           Plan - 01/06/18 1103    Clinical Impression Statement  Pt continues to demonstrate improving ROM. She has increased tightness of right pec that makes it difficult with PROM due to increased tightness and muscle spasm in this area. Began gentle soft tissue mobilzation to right to help decrease tightness. Pt was able to tolerate light pressure with this. One cord could be lightly visualized in right upper arm so did soft tissue mobilization to this area to help improve ROM.    Rehab Potential  Excellent    Clinical Impairments Affecting Rehab Potential  None    PT Frequency  2x / week    PT Duration  4 weeks    PT Treatment/Interventions  ADLs/Self Care Home Management;Therapeutic exercise;Therapeutic activities;Patient/family education;Manual techniques;Manual lymph drainage;Scar mobilization;Passive range of motion    PT Next Visit Plan  continue Rt shoulder ROM for radiation position and ADLs, scar check    PT Home Exercise Plan  Shoulder ROM HEP    Consulted and Agree with Plan of Care  Patient       Patient will benefit from skilled therapeutic intervention in order to improve the following deficits and impairments:  Pain, Postural dysfunction, Decreased range of motion, Decreased knowledge of precautions, Impaired UE functional use, Increased fascial restricitons, Decreased scar mobility, Decreased strength  Visit Diagnosis: Abnormal posture  Aftercare following surgery for neoplasm  Stiffness of right shoulder, not elsewhere classified  Pain in right upper arm     Problem List Patient Active Problem List   Diagnosis Date Noted  . Genetic testing 12/06/2017  . Breast  cancer, stage 2, right ([place]) 12/05/2017  . Family history of breast cancer   . Family history of colon cancer   . Family history  of pancreatic cancer   . Malignant neoplasm of lower-outer quadrant of right breast of female, estrogen receptor positive ([place]) 11/08/2017  . Paresthesia 02/29/2016  . Obesity 02/29/2016  . Abdominal pain, right upper quadrant 10/05/2015  . Left knee pain 12/05/2012  . Allergic rhinitis 11/27/2011  . Cough 11/18/2011    Allyson Sabal [facility] 01/06/2018, 11:06 AM  [place], [place], [address] Phone: [phone]   Fax:  [phone]  Name: Erica Higgins MRN: [id number] Date of Birth: April 30, 1971  Manus Gunning, PT 01/06/18 11:06 AM

## 2018-01-08 ENCOUNTER — Ambulatory Visit: Admitting: Physical Therapy

## 2018-01-08 ENCOUNTER — Encounter: Payer: Self-pay | Admitting: Physical Therapy

## 2018-01-08 DIAGNOSIS — Z483 Aftercare following surgery for neoplasm: Secondary | ICD-10-CM

## 2018-01-08 DIAGNOSIS — M79621 Pain in right upper arm: Secondary | ICD-10-CM

## 2018-01-08 DIAGNOSIS — R293 Abnormal posture: Secondary | ICD-10-CM

## 2018-01-08 DIAGNOSIS — M25611 Stiffness of right shoulder, not elsewhere classified: Secondary | ICD-10-CM

## 2018-01-08 DIAGNOSIS — C50511 Malignant neoplasm of lower-outer quadrant of right female breast: Secondary | ICD-10-CM | POA: Diagnosis not present

## 2018-01-08 NOTE — Therapy (Signed)
Erica Higgins, [place], [address] Phone: [phone]   Fax:  [phone]  Physical Therapy Treatment  Patient Details  Name: Erica Higgins MRN: [id number] Date of Birth: 1971-07-08 Referring Provider: Dr. Erroll Luna   Encounter Date: 01/08/2018  PT End of Session - 01/08/18 1506    Visit Number  8    Number of Visits  9    Date for PT Re-Evaluation  01/16/18    PT Start Time  5790    PT Stop Time  1505    PT Time Calculation (min)  45 min    Activity Tolerance  Patient tolerated treatment well    Behavior During Therapy  [facility] for tasks assessed/performed       Past Medical History:  Diagnosis Date  . Abdominal pain, acute, right upper quadrant   . Anxiety   . Back pain   . Depression   . Joint pain   . Migraine   . Obesity   . Pain    Toes on bilateral feet  . Polycystic ovarian disease   . Sleep apnea     Past Surgical History:  Procedure Laterality Date  . CESAREAN SECTION    . MINISCUS REPAIR  2003 and 2005  . SIMPLE MASTECTOMY WITH AXILLARY SENTINEL NODE BIOPSY Right 12/05/2017   Procedure: SIMPLE MASTECTOMY WITH WIRE LOCALIZATION AND  TARGETED RIGHT AXILLARY SENTINEL NODE MAPPING  AND BIOPSY ERAS PATHWAY;  Surgeon: Erroll Luna, MD;  Location: [place];  Service: General;  Laterality: Right;  PECTORAL BLOCK  . TONSILLECTOMY  1996    There were no vitals filed for this visit.  Subjective Assessment - 01/08/18 1420    Subjective  My shoulder doesn't hurt. My first radiation appointment is next week.     Pertinent History  Patient was diagnosed on 11/01/17 with right grade 2 invasive ductal carcinoma breast cancer. It is ER/PR positive an HER2 negative with a ki67 of 2%. She has no other health problems. Patient underwent a right mastectomy and sentinel node biopsy (1/5 positive nodes) on 12/05/17.     Patient Stated Goals  Make my arm back to normal    Currently in Pain?   Yes    Pain Score  2     Pain Location  Arm    Pain Orientation  Right;Upper;Medial    Pain Descriptors / Indicators  Tightness         [facility] PT Assessment - 01/08/18 0001      AROM   Right Shoulder Flexion  152 Degrees    Right Shoulder ABduction  128 Degrees                   [facility] Adult PT Treatment/Exercise - 01/08/18 0001      Shoulder Exercises: Standing   Row  Both;Theraband;15 reps    Theraband Level (Shoulder Row)  Level 3 (Green)      Shoulder Exercises: Pulleys   Flexion  2 minutes    Scaption  2 minutes      Shoulder Exercises: Therapy Ball   Flexion  10 reps;Both    ABduction  Right;10 reps pt returned therapist demonstration      Manual Therapy   Soft tissue mobilization  gently to right pec in area of tightness - pt able to tolerate this better today, also along cording in R upper medial arm    Passive ROM  Rt shoulder to tolerance flexion, abduction, ER -  pt demonstrating improved ROM today acheived full R shoulder abduction passively today                  PT Long Term Goals - 01/08/18 1505      PT LONG TERM GOAL #1   Title  Patient will demonstrate she has returned to baseline related to shoulder function and ROM.    Time  4    Period  Weeks    Status  On-going      PT LONG TERM GOAL #2   Title  Patient will increase right shoulder active flexion ROM to >/= 150 degrees for increased ease reaching overhead.    Baseline  01/08/18- 152 degrees    Time  4    Period  Weeks    Status  Achieved      PT LONG TERM GOAL #3   Title  Patient will increase right shoulder active abduction ROM to >/= 150 degrees for increased ease reaching overhead.    Baseline  01/08/18- 128 degrees    Time  4    Period  Weeks    Status  On-going      PT LONG TERM GOAL #4   Title  Patient will reduce quick DASH score to </= 20 for increased shoulder function.    Time  4    Period  Weeks    Status  On-going      PT LONG TERM GOAL #5   Title   Patient will verbalize understanding of risk reduction practices related to lymphedema.    Time  4    Period  Weeks    Status  On-going      Breast Clinic Goals - 11/13/17 1121      Patient will be able to verbalize understanding of pertinent lymphedema risk reduction practices relevant to her diagnosis specifically related to skin care.   Time  1    Period  Days    Status  Achieved      Patient will be able to return demonstrate and/or verbalize understanding of the post-op home exercise program related to regaining shoulder range of motion.   Time  1    Period  Days    Status  Achieved      Patient will be able to verbalize understanding of the importance of attending the postoperative After Breast Cancer Class for further lymphedema risk reduction education and therapeutic exercise.   Time  1    Period  Days    Status  Achieved           Plan - 01/08/18 1508    Clinical Impression Statement  Pt is progressing towards goals in therapy. She has met her flexion ROM goal as of today. During PROM was able to move pt's right shoulder through full passive abduction ROM. She was able to tolerate more soft tissue mobilization along right pec and along cording today.     Rehab Potential  Excellent    Clinical Impairments Affecting Rehab Potential  None    PT Frequency  2x / week    PT Duration  4 weeks    PT Treatment/Interventions  ADLs/Self Care Home Management;Therapeutic exercise;Therapeutic activities;Patient/family education;Manual techniques;Manual lymph drainage;Scar mobilization;Passive range of motion    PT Next Visit Plan  continue Rt shoulder ROM for radiation position and ADLs, scar check, myofascial to cording, give supine scap    PT Home Exercise Plan  Shoulder ROM HEP    Consulted and Agree with  Plan of Care  Patient       Patient will benefit from skilled therapeutic intervention in order to improve the following deficits and impairments:  Pain, Postural  dysfunction, Decreased range of motion, Decreased knowledge of precautions, Impaired UE functional use, Increased fascial restricitons, Decreased scar mobility, Decreased strength  Visit Diagnosis: Abnormal posture  Aftercare following surgery for neoplasm  Stiffness of right shoulder, not elsewhere classified  Pain in right upper arm     Problem List Patient Active Problem List   Diagnosis Date Noted  . Genetic testing 12/06/2017  . Breast cancer, stage 2, right ([place]) 12/05/2017  . Family history of breast cancer   . Family history of colon cancer   . Family history of pancreatic cancer   . Malignant neoplasm of lower-outer quadrant of right breast of female, estrogen receptor positive ([place]) 11/08/2017  . Paresthesia 02/29/2016  . Obesity 02/29/2016  . Abdominal pain, right upper quadrant 10/05/2015  . Left knee pain 12/05/2012  . Allergic rhinitis 11/27/2011  . Cough 11/18/2011    Allyson Sabal [facility] 01/08/2018, 3:11 PM  [place], [place], [address] Phone: [phone]   Fax:  [phone]  Name: Vaniya Duross MRN: [id number] Date of Birth: 28-Jul-1971  Allyson Sabal [place], PT 01/08/18 3:11 PM

## 2018-01-13 ENCOUNTER — Encounter: Payer: Self-pay | Admitting: Physical Therapy

## 2018-01-13 ENCOUNTER — Ambulatory Visit: Admitting: Physical Therapy

## 2018-01-13 DIAGNOSIS — C50511 Malignant neoplasm of lower-outer quadrant of right female breast: Secondary | ICD-10-CM | POA: Diagnosis not present

## 2018-01-13 DIAGNOSIS — M79621 Pain in right upper arm: Secondary | ICD-10-CM

## 2018-01-13 DIAGNOSIS — R293 Abnormal posture: Secondary | ICD-10-CM

## 2018-01-13 DIAGNOSIS — Z483 Aftercare following surgery for neoplasm: Secondary | ICD-10-CM

## 2018-01-13 DIAGNOSIS — M25611 Stiffness of right shoulder, not elsewhere classified: Secondary | ICD-10-CM

## 2018-01-13 NOTE — Therapy (Signed)
[place], [place], [address] Phone: [phone]   Fax:  [phone]  Physical Therapy Treatment  Patient Details  Name: Erica Higgins MRN: [id number] Date of Birth: 1970-09-26 Referring Provider: Dr. Erroll Luna   Encounter Date: 01/13/2018  PT End of Session - 01/13/18 1029    Visit Number  9    Number of Visits  9    Date for PT Re-Evaluation  01/16/18    PT Start Time  0936    PT Stop Time  1023    PT Time Calculation (min)  47 min    Activity Tolerance  Patient tolerated treatment well    Behavior During Therapy  [facility] for tasks assessed/performed       Past Medical History:  Diagnosis Date  . Abdominal pain, acute, right upper quadrant   . Anxiety   . Back pain   . Depression   . Joint pain   . Migraine   . Obesity   . Pain    Toes on bilateral feet  . Polycystic ovarian disease   . Sleep apnea     Past Surgical History:  Procedure Laterality Date  . CESAREAN SECTION    . MINISCUS REPAIR  2003 and 2005  . SIMPLE MASTECTOMY WITH AXILLARY SENTINEL NODE BIOPSY Right 12/05/2017   Procedure: SIMPLE MASTECTOMY WITH WIRE LOCALIZATION AND  TARGETED RIGHT AXILLARY SENTINEL NODE MAPPING  AND BIOPSY ERAS PATHWAY;  Surgeon: Erroll Luna, MD;  Location: [place];  Service: General;  Laterality: Right;  PECTORAL BLOCK  . TONSILLECTOMY  1996    There were no vitals filed for this visit.  Subjective Assessment - 01/13/18 0937    Subjective  My arm feels so much stiffer and more tighter. I am having a lot more swelling. The PA thought it was fluid and tissue swelling.     Pertinent History  Patient was diagnosed on 11/01/17 with right grade 2 invasive ductal carcinoma breast cancer. It is ER/PR positive an HER2 negative with a ki67 of 2%. She has no other health problems. Patient underwent a right mastectomy and sentinel node biopsy (1/5 positive nodes) on 12/05/17.     Patient Stated  Goals  Make my arm back to normal    Currently in Pain?  Yes    Pain Score  1     Pain Location  Chest    Pain Orientation  Right            LYMPHEDEMA/ONCOLOGY QUESTIONNAIRE - 01/13/18 1023      Type   Cancer Type                   [facility] Adult PT Treatment/Exercise - 01/13/18 0001      Shoulder Exercises: Pulleys   Flexion  2 minutes    Scaption  2 minutes      Shoulder Exercises: Therapy Ball   Flexion  10 reps;Both    ABduction  Right;10 reps pt returned therapist demonstration      Manual Therapy   Manual Therapy  Soft tissue mobilization;Manual Lymphatic Drainage (MLD)    Soft tissue mobilization  gently to right pec in area of tightness and area surrounding mastectomy scar    Manual Lymphatic Drainage (MLD)  short neck, superficial and deep abdominals, right inguinal nodes and establishment of axillo inguinal pathway, left axillary nodes and establishment of interaxillary pathway, then to left sidelying working on right lateral trunk moving fluid across posterior  inter axillary pathway then back to supine working on Carl working proximal to distal then retracing all steps                  PT Long Term Goals - 01/08/18 1505      PT LONG TERM GOAL #1   Title  Patient will demonstrate she has returned to baseline related to shoulder function and ROM.    Time  4    Period  Weeks    Status  On-going      PT LONG TERM GOAL #2   Title  Patient will increase right shoulder active flexion ROM to >/= 150 degrees for increased ease reaching overhead.    Baseline  01/08/18- 152 degrees    Time  4    Period  Weeks    Status  Achieved      PT LONG TERM GOAL #3   Title  Patient will increase right shoulder active abduction ROM to >/= 150 degrees for increased ease reaching overhead.    Baseline  01/08/18- 128 degrees    Time  4    Period  Weeks    Status  On-going      PT LONG TERM GOAL #4   Title  Patient will reduce quick DASH score to </= 20 for  increased shoulder function.    Time  4    Period  Weeks    Status  On-going      PT LONG TERM GOAL #5   Title  Patient will verbalize understanding of risk reduction practices related to lymphedema.    Time  4    Period  Weeks    Status  On-going      Breast Clinic Goals - 11/13/17 1121      Patient will be able to verbalize understanding of pertinent lymphedema risk reduction practices relevant to her diagnosis specifically related to skin care.   Time  1    Period  Days    Status  Achieved      Patient will be able to return demonstrate and/or verbalize understanding of the post-op home exercise program related to regaining shoulder range of motion.   Time  1    Period  Days    Status  Achieved      Patient will be able to verbalize understanding of the importance of attending the postoperative After Breast Cancer Class for further lymphedema risk reduction education and therapeutic exercise.   Time  1    Period  Days    Status  Achieved           Plan - 01/13/18 1029    Clinical Impression Statement  Pt reports increased tightness across chest and under axilla today. She reports this began on Thursday and she is having increased trouble sleeping and getting comfortable and was unable to work for 2 days. Pt reports increased swelling in this area today. Took circumferential measurements of RUE today and they have increased since last time. Pt may be developing lymphedema in RUE. She is having increased swelling in right lateral trunk so focused on this area today. Pt to see Dr. Brantley Stage this afternoon.     Rehab Potential  Excellent    Clinical Impairments Affecting Rehab Potential  None    PT Frequency  2x / week    PT Duration  4 weeks    PT Treatment/Interventions  ADLs/Self Care Home Management;Therapeutic exercise;Therapeutic activities;Patient/family education;Manual techniques;Manual lymph drainage;Scar mobilization;Passive range of motion  PT Next Visit Plan   update POC, continue MLD for swelling of right axilla, lateral trunk and chest (add localized swelling or lymphedema to updated POC diagnosis), AA/A/PROM to right shoulder    PT Home Exercise Plan  Shoulder ROM HEP    Consulted and Agree with Plan of Care  Patient       Patient will benefit from skilled therapeutic intervention in order to improve the following deficits and impairments:  Pain, Postural dysfunction, Decreased range of motion, Decreased knowledge of precautions, Impaired UE functional use, Increased fascial restricitons, Decreased scar mobility, Decreased strength  Visit Diagnosis: Abnormal posture  Stiffness of right shoulder, not elsewhere classified  Pain in right upper arm  Aftercare following surgery for neoplasm     Problem List Patient Active Problem List   Diagnosis Date Noted  . Genetic testing 12/06/2017  . Breast cancer, stage 2, right ([place]) 12/05/2017  . Family history of breast cancer   . Family history of colon cancer   . Family history of pancreatic cancer   . Malignant neoplasm of lower-outer quadrant of right breast of female, estrogen receptor positive ([place]) 11/08/2017  . Paresthesia 02/29/2016  . Obesity 02/29/2016  . Abdominal pain, right upper quadrant 10/05/2015  . Left knee pain 12/05/2012  . Allergic rhinitis 11/27/2011  . Cough 11/18/2011    Allyson Sabal [facility] 01/13/2018, 10:32 AM  [place], [place], [address] Phone: [phone]   Fax:  [phone]  Name: Shelly Methvin MRN: [id number] Date of Birth: [birth year]  Allyson Sabal [place], PT 01/13/18 10:33 AM

## 2018-01-15 ENCOUNTER — Ambulatory Visit: Admitting: Physical Therapy

## 2018-01-15 ENCOUNTER — Encounter: Payer: Self-pay | Admitting: Physical Therapy

## 2018-01-15 DIAGNOSIS — M79621 Pain in right upper arm: Secondary | ICD-10-CM

## 2018-01-15 DIAGNOSIS — M25611 Stiffness of right shoulder, not elsewhere classified: Secondary | ICD-10-CM

## 2018-01-15 DIAGNOSIS — Z483 Aftercare following surgery for neoplasm: Secondary | ICD-10-CM

## 2018-01-15 DIAGNOSIS — C50511 Malignant neoplasm of lower-outer quadrant of right female breast: Secondary | ICD-10-CM | POA: Diagnosis not present

## 2018-01-15 DIAGNOSIS — R293 Abnormal posture: Secondary | ICD-10-CM

## 2018-01-15 NOTE — Therapy (Signed)
Progress Note Reporting Period 11/13/17  to 01/15/18  See note below for Objective Data and Assessment of Progress/Goals.      [place], [place], [address] Phone: [phone]   Fax:  [phone]  Physical Therapy Treatment  Patient Details  Name: Erica Higgins MRN: [id number] Date of Birth: 1971/03/14 Referring Provider: Dr. Erroll Luna   Encounter Date: 01/15/2018  PT End of Session - 01/15/18 1658    Visit Number  10    Number of Visits  9    Date for PT Re-Evaluation  01/16/18    PT Start Time  1519    PT Stop Time  1603    PT Time Calculation (min)  44 min    Activity Tolerance  Patient tolerated treatment well    Behavior During Therapy  [facility] for tasks assessed/performed       Past Medical History:  Diagnosis Date  . Abdominal pain, acute, right upper quadrant   . Anxiety   . Back pain   . Depression   . Joint pain   . Migraine   . Obesity   . Pain    Toes on bilateral feet  . Polycystic ovarian disease   . Sleep apnea     Past Surgical History:  Procedure Laterality Date  . CESAREAN SECTION    . MINISCUS REPAIR  2003 and 2005  . SIMPLE MASTECTOMY WITH AXILLARY SENTINEL NODE BIOPSY Right 12/05/2017   Procedure: SIMPLE MASTECTOMY WITH WIRE LOCALIZATION AND  TARGETED RIGHT AXILLARY SENTINEL NODE MAPPING  AND BIOPSY ERAS PATHWAY;  Surgeon: Erroll Luna, MD;  Location: [place];  Service: General;  Laterality: Right;  PECTORAL BLOCK  . TONSILLECTOMY  1996    There were no vitals filed for this visit.  Subjective Assessment - 01/15/18 1521    Subjective  I am having more swelling today and I am having more swelling in my upper arm.     Pertinent History  Patient was diagnosed on 11/01/17 with right grade 2 invasive ductal carcinoma breast cancer. It is ER/PR positive an HER2 negative with a ki67 of 2%. She has no other health problems. Patient underwent a right  mastectomy and sentinel node biopsy (1/5 positive nodes) on 12/05/17.     Patient Stated Goals  Make my arm back to normal    Currently in Pain?  No/denies    Pain Score  0-No pain                       [facility] Adult PT Treatment/Exercise - 01/15/18 0001      Shoulder Exercises: Pulleys   Flexion  2 minutes    Scaption  2 minutes      Shoulder Exercises: Therapy Ball   Flexion  10 reps;Both    ABduction  Right;10 reps pt returned therapist demonstration      Manual Therapy   Manual Therapy  Manual Lymphatic Drainage (MLD);Edema management    Edema Management  created chip pack for pt to wear in compression binder in area of right lateral trunk swelling, also cut pieces of TG soft for pt to wear on RUE to help with RUE swelling    Manual Lymphatic Drainage (MLD)  short neck, superficial and deep abdominals, right inguinal nodes and establishment of axillo inguinal pathway, left axillary nodes and establishment of interaxillary pathway, RUE working proximal to distal then retracing all steps  PT Long Term Goals - 01/08/18 1505      PT LONG TERM GOAL #1   Title  Patient will demonstrate she has returned to baseline related to shoulder function and ROM.    Time  4    Period  Weeks    Status  On-going      PT LONG TERM GOAL #2   Title  Patient will increase right shoulder active flexion ROM to >/= 150 degrees for increased ease reaching overhead.    Baseline  01/08/18- 152 degrees    Time  4    Period  Weeks    Status  Achieved      PT LONG TERM GOAL #3   Title  Patient will increase right shoulder active abduction ROM to >/= 150 degrees for increased ease reaching overhead.    Baseline  01/08/18- 128 degrees    Time  4    Period  Weeks    Status  On-going      PT LONG TERM GOAL #4   Title  Patient will reduce quick DASH score to </= 20 for increased shoulder function.    Time  4    Period  Weeks    Status  On-going      PT LONG TERM  GOAL #5   Title  Patient will verbalize understanding of risk reduction practices related to lymphedema.    Time  4    Period  Weeks    Status  On-going      [facility] Goals - 11/13/17 1121      Patient will be able to verbalize understanding of pertinent lymphedema risk reduction practices relevant to her diagnosis specifically related to skin care.   Time  1    Period  Days    Status  Achieved      Patient will be able to return demonstrate and/or verbalize understanding of the post-op home exercise program related to regaining shoulder range of motion.   Time  1    Period  Days    Status  Achieved      Patient will be able to verbalize understanding of the importance of attending the postoperative After Breast Cancer Class for further lymphedema risk reduction education and therapeutic exercise.   Time  1    Period  Days    Status  Achieved           Plan - 01/15/18 1659    Clinical Impression Statement  Pt continues to demonstrate increased swelling in area of R lateral trunk and upper arm. Focused on MLD to these areas today and created a chip pack for pt to wear in binder for management of lateral trunk swelling and cut pieces of TG soft for management of RUE swelling.     Rehab Potential  Excellent    Clinical Impairments Affecting Rehab Potential  None    PT Frequency  2x / week    PT Duration  4 weeks    PT Treatment/Interventions  ADLs/Self Care Home Management;Therapeutic exercise;Therapeutic activities;Patient/family education;Manual techniques;Manual lymph drainage;Scar mobilization;Passive range of motion    PT Next Visit Plan  check for signed Rx for compression garments, update POC and add lymphedema diagnosis, continue MLD for swelling of right axilla, lateral trunk and chest (add localized swelling or lymphedema to updated POC diagnosis), AA/A/PROM to right shoulder    PT Home Exercise Plan  Shoulder ROM HEP    Consulted and Agree with Plan of Care   Patient  Patient will benefit from skilled therapeutic intervention in order to improve the following deficits and impairments:  Pain, Postural dysfunction, Decreased range of motion, Decreased knowledge of precautions, Impaired UE functional use, Increased fascial restricitons, Decreased scar mobility, Decreased strength  Visit Diagnosis: Abnormal posture  Stiffness of right shoulder, not elsewhere classified  Pain in right upper arm  Aftercare following surgery for neoplasm     Problem List Patient Active Problem List   Diagnosis Date Noted  . Genetic testing 12/06/2017  . Breast cancer, stage 2, right ([place]) 12/05/2017  . Family history of breast cancer   . Family history of colon cancer   . Family history of pancreatic cancer   . Malignant neoplasm of lower-outer quadrant of right breast of female, estrogen receptor positive ([place]) 11/08/2017  . Paresthesia 02/29/2016  . Obesity 02/29/2016  . Abdominal pain, right upper quadrant 10/05/2015  . Left knee pain 12/05/2012  . Allergic rhinitis 11/27/2011  . Cough 11/18/2011    Allyson Sabal [facility] 01/15/2018, 5:03 PM  [place], [place], [address] Phone: [phone]   Fax:  [phone]  Name: Erica Higgins MRN: [id number] Date of Birth: 01/16/1971  Allyson Sabal [place], PT 01/15/18 5:05 PM

## 2018-01-20 ENCOUNTER — Encounter: Payer: Self-pay | Admitting: Physical Therapy

## 2018-01-20 ENCOUNTER — Ambulatory Visit: Admitting: Physical Therapy

## 2018-01-20 DIAGNOSIS — R293 Abnormal posture: Secondary | ICD-10-CM

## 2018-01-20 DIAGNOSIS — M79621 Pain in right upper arm: Secondary | ICD-10-CM

## 2018-01-20 DIAGNOSIS — Z483 Aftercare following surgery for neoplasm: Secondary | ICD-10-CM

## 2018-01-20 DIAGNOSIS — M25611 Stiffness of right shoulder, not elsewhere classified: Secondary | ICD-10-CM

## 2018-01-20 DIAGNOSIS — C50511 Malignant neoplasm of lower-outer quadrant of right female breast: Secondary | ICD-10-CM | POA: Diagnosis not present

## 2018-01-20 NOTE — Therapy (Signed)
[place], [place], [address] Phone: [phone]   Fax:  [phone]  Physical Therapy Treatment  Patient Details  Name: Erica Higgins MRN: [id number] Date of Birth: 09/12/1970 Referring Provider: Dr Erroll Luna   Encounter Date: 01/20/2018  PT End of Session - 01/20/18 1741    Visit Number  11    Number of Visits  20    Date for PT Re-Evaluation  02/20/18    PT Start Time  1430    PT Stop Time  1515    PT Time Calculation (min)  45 min    Activity Tolerance  Patient tolerated treatment well    Behavior During Therapy  [facility] for tasks assessed/performed       Past Medical History:  Diagnosis Date  . Abdominal pain, acute, right upper quadrant   . Anxiety   . Back pain   . Depression   . Joint pain   . Migraine   . Obesity   . Pain    Toes on bilateral feet  . Polycystic ovarian disease   . Sleep apnea     Past Surgical History:  Procedure Laterality Date  . CESAREAN SECTION    . MINISCUS REPAIR  2003 and 2005  . SIMPLE MASTECTOMY WITH AXILLARY SENTINEL NODE BIOPSY Right 12/05/2017   Procedure: SIMPLE MASTECTOMY WITH WIRE LOCALIZATION AND  TARGETED RIGHT AXILLARY SENTINEL NODE MAPPING  AND BIOPSY ERAS PATHWAY;  Surgeon: Erroll Luna, MD;  Location: [place];  Service: General;  Laterality: Right;  PECTORAL BLOCK  . TONSILLECTOMY  1996    There were no vitals filed for this visit.  Subjective Assessment - 01/20/18 1437    Subjective  "I still have  lump under my arm" She has to be refitted for her compression bra because the one she got did not fit well.   She states the fullness under her arm is getting better but is still presents.  She incision is healing well.  She still has alot of tightness in her pec major muscle     Pertinent History  Patient was diagnosed on 11/01/17 with right grade 2 invasive ductal carcinoma breast cancer. It is ER/PR positive an HER2 negative  with a ki67 of 2%. She has no other health problems. Patient underwent a right mastectomy and sentinel node biopsy (1/5 positive nodes) on 12/05/17.     Patient Stated Goals  Make my arm back to normal    Currently in Pain?  No/denies just tightness         [facility] PT Assessment - 01/20/18 0001      Assessment   Medical Diagnosis  s/p right mastectomy and SLNB    Referring Provider  Dr [name] [place]    Onset Date/Surgical Date  12/05/17      Prior Function   Level of Independence  Independent      AROM   Right Shoulder Flexion  160 Degrees    Right Shoulder ABduction  160 Degrees        LYMPHEDEMA/ONCOLOGY QUESTIONNAIRE - 01/20/18 1500      Right Upper Extremity Lymphedema   10 cm Proximal to Olecranon Process  44 cm    Olecranon Process  31 cm    15 cm Proximal to Ulnar Styloid Process  31 cm    10 cm Proximal to Ulnar Styloid Process  28.5 cm    Just Proximal to Ulnar Styloid Process  18 cm  Across Hand at [employer]  20.3 cm    At [place] of 2nd Digit  6.5 cm                [facility] Adult PT Treatment/Exercise - 01/20/18 0001      Shoulder Exercises: Sidelying   External Rotation  PROM;Right;10 reps    ABduction  AROM;Right;10 reps    Other Sidelying Exercises  small circles with hand pointed to ceiling       Manual Therapy   Manual Lymphatic Drainage (MLD)  briefly to right lateral chest and back in sidelying                   PT Long Term Goals - 01/20/18 1506      PT LONG TERM GOAL #1   Title  Patient will demonstrate she has returned to baseline related to shoulder function and ROM.    Status  On-going      PT LONG TERM GOAL #2   Title  Patient will increase right shoulder active flexion ROM to >/= 150 degrees for increased ease reaching overhead.    Baseline  01/08/18- 152 degrees, 5/20/ 160    Status  Achieved      PT LONG TERM GOAL #3   Title  Patient will increase right shoulder active abduction ROM to >/= 150 degrees for  increased ease reaching overhead.    Baseline  01/08/18- 128 degrees, 01/20/2018 160 degrees     Status  Achieved      PT LONG TERM GOAL #4   Title  Patient will reduce quick DASH score to </= 20 for increased shoulder function.    Status  On-going      PT LONG TERM GOAL #5   Title  Patient will verbalize understanding of risk reduction practices related to lymphedema.    Status  Achieved      PT LONG TERM GOAL #6   Title  Pt will report the tightness in her right pec major is improved by 75% so she can do her actiities at normal speed    Time  4    Period  [facility] Goals - 11/13/17 1121      Patient will be able to verbalize understanding of pertinent lymphedema risk reduction practices relevant to her diagnosis specifically related to skin care.   Time  1    Period  Days    Status  Achieved      Patient will be able to return demonstrate and/or verbalize understanding of the post-op home exercise program related to regaining shoulder range of motion.   Time  1    Period  Days    Status  Achieved      Patient will be able to verbalize understanding of the importance of attending the postoperative After Breast Cancer Class for further lymphedema risk reduction education and therapeutic exercise.   Time  1    Period  Days    Status  Achieved           Plan - 01/20/18 1742    Clinical Impression Statement  Pt states she is wearing the chip pack during the day at work and she thinks it might be helping. She still had tightness and pain in her left chest pec major area that limit her functional activities as it makes her move slower.  Her range of motion is improved, but she  wants to keep coming to decrase the pain and tightness she feels and to get her arms stronger     Rehab Potential  Excellent    Clinical Impairments Affecting Rehab Potential  None    PT Frequency  2x / week    PT Duration  4 weeks    PT Treatment/Interventions  ADLs/Self  Care Home Management;Therapeutic exercise;Therapeutic activities;Patient/family education;Manual techniques;Manual lymph drainage;Scar mobilization;Passive range of motion    PT Next Visit Plan  check for signed Rx for compression garments, update POC and add lymphedema diagnosis, continue MLD for swelling of right axilla, lateral trunk and chest, continue AA/A/PROM to right shoulder and progress strengthening     Consulted and Agree with Plan of Care  Patient       Patient will benefit from skilled therapeutic intervention in order to improve the following deficits and impairments:  Pain, Postural dysfunction, Decreased range of motion, Decreased knowledge of precautions, Impaired UE functional use, Increased fascial restricitons, Decreased scar mobility, Decreased strength  Visit Diagnosis: Abnormal posture - Plan: PT plan of care cert/re-cert  Stiffness of right shoulder, not elsewhere classified - Plan: PT plan of care cert/re-cert  Pain in right upper arm - Plan: PT plan of care cert/re-cert  Aftercare following surgery for neoplasm - Plan: PT plan of care cert/re-cert     Problem List Patient Active Problem List   Diagnosis Date Noted  . Genetic testing 12/06/2017  . Breast cancer, stage 2, right ([place]) 12/05/2017  . Family history of breast cancer   . Family history of colon cancer   . Family history of pancreatic cancer   . Malignant neoplasm of lower-outer quadrant of right breast of female, estrogen receptor positive ([place]) 11/08/2017  . Paresthesia 02/29/2016  . Obesity 02/29/2016  . Abdominal pain, right upper quadrant 10/05/2015  . Left knee pain 12/05/2012  . Allergic rhinitis 11/27/2011  . Cough 11/18/2011   Donato Heinz. Owens Shark PT  [place] Levo 01/20/2018, 5:50 PM  [place], [place], [address] Phone: [phone]   Fax:  [phone]  Name: Erica Higgins MRN: [id number] Date of  Birth: 07-17-1971

## 2018-01-23 ENCOUNTER — Ambulatory Visit: Admitting: Physical Therapy

## 2018-01-23 ENCOUNTER — Encounter: Payer: Self-pay | Admitting: Physical Therapy

## 2018-01-23 DIAGNOSIS — C50511 Malignant neoplasm of lower-outer quadrant of right female breast: Secondary | ICD-10-CM | POA: Diagnosis not present

## 2018-01-23 DIAGNOSIS — M25611 Stiffness of right shoulder, not elsewhere classified: Secondary | ICD-10-CM

## 2018-01-23 DIAGNOSIS — Z483 Aftercare following surgery for neoplasm: Secondary | ICD-10-CM

## 2018-01-23 DIAGNOSIS — R293 Abnormal posture: Secondary | ICD-10-CM

## 2018-01-23 DIAGNOSIS — M79621 Pain in right upper arm: Secondary | ICD-10-CM

## 2018-01-23 NOTE — Patient Instructions (Signed)

## 2018-01-23 NOTE — Therapy (Signed)
[place], [place], [address] Phone: [phone]   Fax:  [phone]  Physical Therapy Treatment  Patient Details  Name: Erica Higgins MRN: [id number] Date of Birth: 04-Nov-1970 Referring Provider: Dr Erroll Luna   Encounter Date: 01/23/2018  PT End of Session - 01/23/18 1650    Visit Number  12    Number of Visits  20    Date for PT Re-Evaluation  02/20/18    PT Start Time  0093    PT Stop Time  1648    PT Time Calculation (min)  43 min    Activity Tolerance  Patient tolerated treatment well    Behavior During Therapy  [facility] for tasks assessed/performed       Past Medical History:  Diagnosis Date  . Abdominal pain, acute, right upper quadrant   . Anxiety   . Back pain   . Depression   . Joint pain   . Migraine   . Obesity   . Pain    Toes on bilateral feet  . Polycystic ovarian disease   . Sleep apnea     Past Surgical History:  Procedure Laterality Date  . CESAREAN SECTION    . MINISCUS REPAIR  2003 and 2005  . SIMPLE MASTECTOMY WITH AXILLARY SENTINEL NODE BIOPSY Right 12/05/2017   Procedure: SIMPLE MASTECTOMY WITH WIRE LOCALIZATION AND  TARGETED RIGHT AXILLARY SENTINEL NODE MAPPING  AND BIOPSY ERAS PATHWAY;  Surgeon: Erroll Luna, MD;  Location: [facility] [place];  Service: General;  Laterality: Right;  PECTORAL BLOCK  . TONSILLECTOMY  1996    There were no vitals filed for this visit.  Subjective Assessment - 01/23/18 1610    Subjective  I still have that place under my arm. I think I am just used to it. My shoulder is ok.    Pertinent History  Patient was diagnosed on 11/01/17 with right grade 2 invasive ductal carcinoma breast cancer. It is ER/PR positive an HER2 negative with a ki67 of 2%. She has no other health problems. Patient underwent a right mastectomy and sentinel node biopsy (1/5 positive nodes) on 12/05/17.     Patient Stated Goals  Make my arm back to normal    Currently in Pain?  No/denies    Pain Score  0-No pain                       [facility] Adult PT Treatment/Exercise - 01/23/18 0001      Shoulder Exercises: Supine   Horizontal ABduction  Strengthening;Both;10 reps;Theraband pt returned therapist demo    Theraband Level (Shoulder Horizontal ABduction)  Level 2 (Red)    External Rotation  Strengthening;Both;10 reps;Theraband pt returned therapist demo    Theraband Level (Shoulder External Rotation)  Level 2 (Red)    Flexion  Strengthening;Both;10 reps;Theraband pt returned therapist demo, narrow and wide grip    Theraband Level (Shoulder Flexion)  Level 2 (Red)    Other Supine Exercises  D2 x 10 reps bilaterally with red band - pt returned therapist demo      Shoulder Exercises: Pulleys   Flexion  2 minutes    Scaption  2 minutes      Shoulder Exercises: Therapy Ball   Flexion  10 reps;Both    ABduction  Right;10 reps pt returned therapist demonstration      Manual Therapy   Manual Lymphatic Drainage (MLD)  short neck, 5 diaphragmatic breaths, right inguinal nodes and establishment  of axillo inguinal pathway, left axillary nodes and establishment of interaxillary pathway, RUE working proximal to distal then retracing all steps                  PT Long Term Goals - 01/20/18 1506      PT LONG TERM GOAL #1   Title  Patient will demonstrate she has returned to baseline related to shoulder function and ROM.    Status  On-going      PT LONG TERM GOAL #2   Title  Patient will increase right shoulder active flexion ROM to >/= 150 degrees for increased ease reaching overhead.    Baseline  01/08/18- 152 degrees, 5/20/ 160    Status  Achieved      PT LONG TERM GOAL #3   Title  Patient will increase right shoulder active abduction ROM to >/= 150 degrees for increased ease reaching overhead.    Baseline  01/08/18- 128 degrees, 01/20/2018 160 degrees     Status  Achieved      PT LONG TERM GOAL #4   Title  Patient will  reduce quick DASH score to </= 20 for increased shoulder function.    Status  On-going      PT LONG TERM GOAL #5   Title  Patient will verbalize understanding of risk reduction practices related to lymphedema.    Status  Achieved      PT LONG TERM GOAL #6   Title  Pt will report the tightness in her right pec major is improved by 75% so she can do her actiities at normal speed    Time  4    Period  [facility] Goals - 11/13/17 1121      Patient will be able to verbalize understanding of pertinent lymphedema risk reduction practices relevant to her diagnosis specifically related to skin care.   Time  1    Period  Days    Status  Achieved      Patient will be able to return demonstrate and/or verbalize understanding of the post-op home exercise program related to regaining shoulder range of motion.   Time  1    Period  Days    Status  Achieved      Patient will be able to verbalize understanding of the importance of attending the postoperative After Breast Cancer Class for further lymphedema risk reduction education and therapeutic exercise.   Time  1    Period  Days    Status  Achieved           Plan - 01/23/18 1651    Clinical Impression Statement  Pt is continuing to demonstrate improving ROM so today added strengthening exercises to her HEP and had her demonstrate correct technique. She reports she is having decreased discomfort from the cording and it is no longer bothering her. Issued pt Rx to obtain compression garments for RUE and performed MLD today.     Rehab Potential  Excellent    Clinical Impairments Affecting Rehab Potential  None    PT Frequency  2x / week    PT Duration  4 weeks    PT Treatment/Interventions  ADLs/Self Care Home Management;Therapeutic exercise;Therapeutic activities;Patient/family education;Manual techniques;Manual lymph drainage;Scar mobilization;Passive range of motion    PT Next Visit Plan   add lymphedema  diagnosis, continue MLD for swelling of right axilla, lateral trunk and chest, continue AA/A/PROM to right shoulder  and progress strengthening, assess indep with supine scap    PT Home Exercise Plan  Shoulder ROM HEP, supine scap    Consulted and Agree with Plan of Care  Patient       Patient will benefit from skilled therapeutic intervention in order to improve the following deficits and impairments:  Pain, Postural dysfunction, Decreased range of motion, Decreased knowledge of precautions, Impaired UE functional use, Increased fascial restricitons, Decreased scar mobility, Decreased strength  Visit Diagnosis: Abnormal posture  Stiffness of right shoulder, not elsewhere classified  Pain in right upper arm  Aftercare following surgery for neoplasm     Problem List Patient Active Problem List   Diagnosis Date Noted  . Genetic testing 12/06/2017  . Breast cancer, stage 2, right ([place]) 12/05/2017  . Family history of breast cancer   . Family history of colon cancer   . Family history of pancreatic cancer   . Malignant neoplasm of lower-outer quadrant of right breast of female, estrogen receptor positive ([place]) 11/08/2017  . Paresthesia 02/29/2016  . Obesity 02/29/2016  . Abdominal pain, right upper quadrant 10/05/2015  . Left knee pain 12/05/2012  . Allergic rhinitis 11/27/2011  . Cough 11/18/2011    Allyson Sabal [facility] 01/23/2018, 4:53 PM  [place], [place], [address] Phone: [phone]   Fax:  [phone]  Name: Erica Higgins MRN: [id number] Date of Birth: August 23, 1971  Allyson Sabal [place], PT 01/23/18 4:54 PM

## 2018-01-29 ENCOUNTER — Encounter: Payer: Self-pay | Admitting: Physical Therapy

## 2018-01-29 ENCOUNTER — Ambulatory Visit: Admitting: Physical Therapy

## 2018-01-29 DIAGNOSIS — R293 Abnormal posture: Secondary | ICD-10-CM

## 2018-01-29 DIAGNOSIS — M25611 Stiffness of right shoulder, not elsewhere classified: Secondary | ICD-10-CM

## 2018-01-29 DIAGNOSIS — C50511 Malignant neoplasm of lower-outer quadrant of right female breast: Secondary | ICD-10-CM | POA: Diagnosis not present

## 2018-01-29 NOTE — Patient Instructions (Signed)
Strengthening: Resisted Flexion   Cancer Rehab [phone]    Hold tubing with right arm at side. Pull forward and up. Move shoulder through pain-free range of motion. Repeat __10__ times per set. Do _1-2___ sessions per day.  Strengthening: Resisted Internal Rotation    Hold tubing in right hand, elbow at side and forearm out. Rotate forearm in across body. Repeat __10__ times per set. Do _1-2___ sessions per day.  Strengthening: Resisted Extension    Hold tubing in right hand, arm forward. Pull arm back, elbow straight. Repeat _10___ times per set. Do _1-2___ sessions per day.  Strengthening: Resisted External Rotation    Hold tubing in right hand, elbow at side and forearm across body. Rotate forearm out. Repeat _10___ times per set. Do __1-2__ sessions per day.   Repeat on the left side.

## 2018-01-29 NOTE — Therapy (Signed)
[place], [place], [address] Phone: [phone]   Fax:  [phone]  Physical Therapy Treatment  Patient Details  Name: Erica Higgins MRN: [id number] Date of Birth: 02-14-1971 Referring Provider: Dr Erroll Luna   Encounter Date: 01/29/2018  PT End of Session - 01/29/18 1212    Visit Number  13    Number of Visits  20    Date for PT Re-Evaluation  02/20/18    PT Start Time  0854    PT Stop Time  0932    PT Time Calculation (min)  38 min    Activity Tolerance  Patient tolerated treatment well    Behavior During Therapy  [facility] for tasks assessed/performed       Past Medical History:  Diagnosis Date  . Abdominal pain, acute, right upper quadrant   . Anxiety   . Back pain   . Depression   . Joint pain   . Migraine   . Obesity   . Pain    Toes on bilateral feet  . Polycystic ovarian disease   . Sleep apnea     Past Surgical History:  Procedure Laterality Date  . CESAREAN SECTION    . MINISCUS REPAIR  2003 and 2005  . SIMPLE MASTECTOMY WITH AXILLARY SENTINEL NODE BIOPSY Right 12/05/2017   Procedure: SIMPLE MASTECTOMY WITH WIRE LOCALIZATION AND  TARGETED RIGHT AXILLARY SENTINEL NODE MAPPING  AND BIOPSY ERAS PATHWAY;  Surgeon: Erroll Luna, MD;  Location: [place];  Service: General;  Laterality: Right;  PECTORAL BLOCK  . TONSILLECTOMY  1996    There were no vitals filed for this visit.  Subjective Assessment - 01/29/18 0856    Subjective  I haven't had any pain under my arm. It feels a little tight but not very much. It is getting better. I go today to get measured for my sleeve and glove. I start radiation today.     Pertinent History  Patient was diagnosed on 11/01/17 with right grade 2 invasive ductal carcinoma breast cancer. It is ER/PR positive an HER2 negative with a ki67 of 2%. She has no other health problems. Patient underwent a right mastectomy and sentinel node biopsy  (1/5 positive nodes) on 12/05/17.     Patient Stated Goals  Make my arm back to normal    Currently in Pain?  No/denies    Pain Score  0-No pain                       [facility] Adult PT Treatment/Exercise - 01/29/18 0001      Shoulder Exercises: Supine   Horizontal ABduction  Strengthening;Both;10 reps;Theraband pt returned therapist demo    Theraband Level (Shoulder Horizontal ABduction)  Level 2 (Red)    External Rotation  Strengthening;Both;10 reps;Theraband pt returned therapist demo    Theraband Level (Shoulder External Rotation)  Level 2 (Red)    Flexion  Strengthening;Both;10 reps;Theraband pt returned therapist demo, narrow and wide grip    Theraband Level (Shoulder Flexion)  Level 2 (Red)    Other Supine Exercises  D2 x 10 reps bilaterally with red band - pt returned therapist demo      Shoulder Exercises: Standing   External Rotation  Strengthening;10 reps;Theraband;Right pt returned therapist demo    Theraband Level (Shoulder External Rotation)  Level 2 (Red)    Internal Rotation  Strengthening;Right;10 reps;Theraband pt returned therapist demo    Theraband Level (Shoulder Internal Rotation)  Level  2 (Red)    Flexion  Strengthening;Right;10 reps;Theraband pt returned therapist demo    Theraband Level (Shoulder Flexion)  Level 2 (Red)    Extension  Strengthening;Right;10 reps;Theraband pt returned therapist demo    Theraband Level (Shoulder Extension)  Level 2 (Red)      Shoulder Exercises: Pulleys   Flexion  2 minutes    Scaption  2 minutes      Shoulder Exercises: Therapy Ball   Flexion  10 reps;Both    ABduction  Right;10 reps pt returned therapist demonstration      Manual Therapy   Manual Lymphatic Drainage (MLD)  short neck, 5 diaphragmatic breaths, right inguinal nodes and establishment of axillo inguinal pathway, left axillary nodes and establishment of interaxillary pathway, R lateral trunk working proximal to distal then retracing all steps     Passive ROM  Rt shoulder to pt's tolerance flexion, abduction, ER pt with less discomfort today during PROM                  PT Long Term Goals - 01/20/18 1506      PT LONG TERM GOAL #1   Title  Patient will demonstrate she has returned to baseline related to shoulder function and ROM.    Status  On-going      PT LONG TERM GOAL #2   Title  Patient will increase right shoulder active flexion ROM to >/= 150 degrees for increased ease reaching overhead.    Baseline  01/08/18- 152 degrees, 5/20/ 160    Status  Achieved      PT LONG TERM GOAL #3   Title  Patient will increase right shoulder active abduction ROM to >/= 150 degrees for increased ease reaching overhead.    Baseline  01/08/18- 128 degrees, 01/20/2018 160 degrees     Status  Achieved      PT LONG TERM GOAL #4   Title  Patient will reduce quick DASH score to </= 20 for increased shoulder function.    Status  On-going      PT LONG TERM GOAL #5   Title  Patient will verbalize understanding of risk reduction practices related to lymphedema.    Status  Achieved      PT LONG TERM GOAL #6   Title  Pt will report the tightness in her right pec major is improved by 75% so she can do her actiities at normal speed    Time  4    Period  [facility] Goals - 11/13/17 1121      Patient will be able to verbalize understanding of pertinent lymphedema risk reduction practices relevant to her diagnosis specifically related to skin care.   Time  1    Period  Days    Status  Achieved      Patient will be able to return demonstrate and/or verbalize understanding of the post-op home exercise program related to regaining shoulder range of motion.   Time  1    Period  Days    Status  Achieved      Patient will be able to verbalize understanding of the importance of attending the postoperative After Breast Cancer Class for further lymphedema risk reduction education and therapeutic exercise.   Time  1     Period  Days    Status  Achieved           Plan - 01/29/18 1213  Clinical Impression Statement  Pt is going this afternoon to get measured for compression garments. Continued MLD to R lateral trunk in area of fullness. Pt will begin radiation today. Continued with PROM to right shoulder since pt is having tightness in right axilla. Instructed pt in [place] exercises for strengthening.     Rehab Potential  Excellent    Clinical Impairments Affecting Rehab Potential  None    PT Frequency  2x / week    PT Duration  4 weeks    PT Treatment/Interventions  ADLs/Self Care Home Management;Therapeutic exercise;Therapeutic activities;Patient/family education;Manual techniques;Manual lymph drainage;Scar mobilization;Passive range of motion    PT Next Visit Plan  continue MLD for swelling of right axilla, lateral trunk and chest, continue AA/A/PROM to right shoulder and progress strengthening, assess indep with supine scap    PT Home Exercise Plan  Shoulder ROM HEP, supine scap    Consulted and Agree with Plan of Care  Patient       Patient will benefit from skilled therapeutic intervention in order to improve the following deficits and impairments:  Pain, Postural dysfunction, Decreased range of motion, Decreased knowledge of precautions, Impaired UE functional use, Increased fascial restricitons, Decreased scar mobility, Decreased strength  Visit Diagnosis: Abnormal posture  Stiffness of right shoulder, not elsewhere classified     Problem List Patient Active Problem List   Diagnosis Date Noted  . Genetic testing 12/06/2017  . Breast cancer, stage 2, right ([place]) 12/05/2017  . Family history of breast cancer   . Family history of colon cancer   . Family history of pancreatic cancer   . Malignant neoplasm of lower-outer quadrant of right breast of female, estrogen receptor positive ([place]) 11/08/2017  . Paresthesia 02/29/2016  . Obesity 02/29/2016  . Abdominal pain, right upper  quadrant 10/05/2015  . Left knee pain 12/05/2012  . Allergic rhinitis 11/27/2011  . Cough 11/18/2011    Allyson Sabal [facility] 01/29/2018, 12:16 PM  [place], [place], [address] Phone: [phone]   Fax:  [phone]  Name: Erica Higgins MRN: [id number] Date of Birth: 1970-11-02  Allyson Sabal [place], PT 01/29/18 12:17 PM

## 2018-01-30 ENCOUNTER — Ambulatory Visit: Admitting: Physical Therapy

## 2018-01-30 ENCOUNTER — Encounter: Payer: Self-pay | Admitting: Physical Therapy

## 2018-01-30 DIAGNOSIS — M79621 Pain in right upper arm: Secondary | ICD-10-CM

## 2018-01-30 DIAGNOSIS — M25611 Stiffness of right shoulder, not elsewhere classified: Secondary | ICD-10-CM

## 2018-01-30 DIAGNOSIS — R293 Abnormal posture: Secondary | ICD-10-CM

## 2018-01-30 DIAGNOSIS — Z483 Aftercare following surgery for neoplasm: Secondary | ICD-10-CM

## 2018-01-30 DIAGNOSIS — C50511 Malignant neoplasm of lower-outer quadrant of right female breast: Secondary | ICD-10-CM | POA: Diagnosis not present

## 2018-01-30 NOTE — Therapy (Signed)
[place], [place], [address] Phone: [phone]   Fax:  [phone]  Physical Therapy Treatment  Patient Details  Name: Erica Higgins MRN: [id number] Date of Birth: 1971-02-05 Referring Provider: Dr Erroll Luna   Encounter Date: 01/30/2018  PT End of Session - 01/30/18 1923    Visit Number  14    Number of Visits  20    Date for PT Re-Evaluation  02/20/18    PT Start Time  3536    PT Stop Time  1600    PT Time Calculation (min)  45 min       Past Medical History:  Diagnosis Date  . Abdominal pain, acute, right upper quadrant   . Anxiety   . Back pain   . Depression   . Joint pain   . Migraine   . Obesity   . Pain    Toes on bilateral feet  . Polycystic ovarian disease   . Sleep apnea     Past Surgical History:  Procedure Laterality Date  . CESAREAN SECTION    . MINISCUS REPAIR  2003 and 2005  . SIMPLE MASTECTOMY WITH AXILLARY SENTINEL NODE BIOPSY Right 12/05/2017   Procedure: SIMPLE MASTECTOMY WITH WIRE LOCALIZATION AND  TARGETED RIGHT AXILLARY SENTINEL NODE MAPPING  AND BIOPSY ERAS PATHWAY;  Surgeon: Erroll Luna, MD;  Location: [place];  Service: General;  Laterality: Right;  PECTORAL BLOCK  . TONSILLECTOMY  1996    There were no vitals filed for this visit.  Subjective Assessment - 01/30/18 1526    Subjective  Pt states her left shoulder is achy , She was having muscles spasms in her right shoulder today     Pertinent History  Patient was diagnosed on 11/01/17 with right grade 2 invasive ductal carcinoma breast cancer. It is ER/PR positive an HER2 negative with a ki67 of 2%. She has no other health problems. Patient underwent a right mastectomy and sentinel node biopsy (1/5 positive nodes) on 12/05/17.     Patient Stated Goals  Make my arm back to normal    Currently in Pain?  Yes    Pain Location  Shoulder    Pain Orientation  Right    Pain Descriptors / Indicators   Aching                       [facility] Adult PT Treatment/Exercise - 01/30/18 0001      Exercises   Exercises  Lumbar      Lumbar Exercises: Stretches   Lower Trunk Rotation  3 reps;10 seconds with goal post arms and deep breathing       Shoulder Exercises: Supine   Protraction  AROM;Right;10 reps      Shoulder Exercises: Sidelying   External Rotation  AROM;Right;10 reps    ABduction  AROM;Right;10 reps    Other Sidelying Exercises  small circles pointing to ceiling trying to control arm and widen circles to stretch anterior chest       Manual Therapy   Manual Lymphatic Drainage (MLD)  short neck, 5 diaphragmatic breaths, right inguinal nodes and establishment of axillo inguinal pathway, left axillary nodes and establishment of interaxillary pathway, R lateral trunk working proximal to distal then retracing all steps    Passive ROM  Rt shoulder to pt's tolerance flexion, abduction, ER pt with less discomfort today during PROM  PT Long Term Goals - 01/20/18 1506      PT LONG TERM GOAL #1   Title  Patient will demonstrate she has returned to baseline related to shoulder function and ROM.    Status  On-going      PT LONG TERM GOAL #2   Title  Patient will increase right shoulder active flexion ROM to >/= 150 degrees for increased ease reaching overhead.    Baseline  01/08/18- 152 degrees, 5/20/ 160    Status  Achieved      PT LONG TERM GOAL #3   Title  Patient will increase right shoulder active abduction ROM to >/= 150 degrees for increased ease reaching overhead.    Baseline  01/08/18- 128 degrees, 01/20/2018 160 degrees     Status  Achieved      PT LONG TERM GOAL #4   Title  Patient will reduce quick DASH score to </= 20 for increased shoulder function.    Status  On-going      PT LONG TERM GOAL #5   Title  Patient will verbalize understanding of risk reduction practices related to lymphedema.    Status  Achieved      PT LONG TERM  GOAL #6   Title  Pt will report the tightness in her right pec major is improved by 75% so she can do her actiities at normal speed    Time  4    Period  [facility] Goals - 11/13/17 1121      Patient will be able to verbalize understanding of pertinent lymphedema risk reduction practices relevant to her diagnosis specifically related to skin care.   Time  1    Period  Days    Status  Achieved      Patient will be able to return demonstrate and/or verbalize understanding of the post-op home exercise program related to regaining shoulder range of motion.   Time  1    Period  Days    Status  Achieved      Patient will be able to verbalize understanding of the importance of attending the postoperative After Breast Cancer Class for further lymphedema risk reduction education and therapeutic exercise.   Time  1    Period  Days    Status  Achieved           Plan - 01/30/18 1924    Clinical Impression Statement  Pt got measured for her compression yesterday and is receiving radiation.  She continues to have tightness in her pec area and fullness in lateral trunk. Emphasized sidelying shoulder work and deep breathing today with lower trunk rotation for stretch across chest in diagonals      Rehab Potential  Excellent    PT Next Visit Plan  continue MLD for swelling of right axilla, lateral trunk and chest, continue AA/A/PROM to right shoulder and progress strengthening, assess indep with supine scap    Consulted and Agree with Plan of Care  Patient       Patient will benefit from skilled therapeutic intervention in order to improve the following deficits and impairments:  Pain, Postural dysfunction, Decreased range of motion, Decreased knowledge of precautions, Impaired UE functional use, Increased fascial restricitons, Decreased scar mobility, Decreased strength  Visit Diagnosis: Abnormal posture  Stiffness of right shoulder, not elsewhere  classified  Pain in right upper arm  Aftercare following surgery for neoplasm  Problem List Patient Active Problem List   Diagnosis Date Noted  . Genetic testing 12/06/2017  . Breast cancer, stage 2, right ([place]) 12/05/2017  . Family history of breast cancer   . Family history of colon cancer   . Family history of pancreatic cancer   . Malignant neoplasm of lower-outer quadrant of right breast of female, estrogen receptor positive ([place]) 11/08/2017  . Paresthesia 02/29/2016  . Obesity 02/29/2016  . Abdominal pain, right upper quadrant 10/05/2015  . Left knee pain 12/05/2012  . Allergic rhinitis 11/27/2011  . Cough 11/18/2011   Donato Heinz. Owens Shark PT  Norwood Levo 01/30/2018, 7:28 PM  [place], [place], [address] Phone: [phone]   Fax:  [phone]  Name: Erica Higgins MRN: [id number] Date of Birth: [birth year]

## 2018-02-03 ENCOUNTER — Telehealth: Payer: Self-pay | Admitting: Hematology and Oncology

## 2018-02-03 NOTE — Telephone Encounter (Signed)
Left message for patient regarding upcoming July appointments per 5/31 sch message

## 2018-02-04 ENCOUNTER — Ambulatory Visit: Attending: Surgery | Admitting: Physical Therapy

## 2018-02-04 ENCOUNTER — Encounter: Payer: Self-pay | Admitting: Physical Therapy

## 2018-02-04 DIAGNOSIS — C50511 Malignant neoplasm of lower-outer quadrant of right female breast: Secondary | ICD-10-CM | POA: Insufficient documentation

## 2018-02-04 DIAGNOSIS — Z483 Aftercare following surgery for neoplasm: Secondary | ICD-10-CM | POA: Insufficient documentation

## 2018-02-04 DIAGNOSIS — M79621 Pain in right upper arm: Secondary | ICD-10-CM | POA: Insufficient documentation

## 2018-02-04 DIAGNOSIS — R293 Abnormal posture: Secondary | ICD-10-CM | POA: Insufficient documentation

## 2018-02-04 DIAGNOSIS — M25611 Stiffness of right shoulder, not elsewhere classified: Secondary | ICD-10-CM | POA: Diagnosis present

## 2018-02-04 DIAGNOSIS — Z170 Estrogen receptor positive status [ER+]: Secondary | ICD-10-CM | POA: Insufficient documentation

## 2018-02-04 NOTE — Therapy (Signed)
[place] Higgins, [place], [address] Phone: [phone]   Fax:  [phone]  Physical Therapy Treatment  Patient Details  Name: Erica Higgins MRN: [id number] Date of Birth: 1971/07/22 Referring Provider: Dr Erica Higgins   Encounter Date: 02/04/2018  PT End of Session - 02/04/18 1756    Visit Number  15    Number of Visits  20    Date for PT Re-Evaluation  02/20/18    PT Start Time  1610    PT Stop Time  1650    PT Time Calculation (min)  40 min    Activity Tolerance  Patient tolerated treatment well    Behavior During Therapy  [facility] for tasks assessed/performed       Past Medical History:  Diagnosis Date  . Abdominal pain, acute, right upper quadrant   . Anxiety   . Back pain   . Depression   . Joint pain   . Migraine   . Obesity   . Pain    Toes on bilateral feet  . Polycystic ovarian disease   . Sleep apnea     Past Surgical History:  Procedure Laterality Date  . CESAREAN SECTION    . MINISCUS REPAIR  2003 and 2005  . SIMPLE MASTECTOMY WITH AXILLARY SENTINEL NODE BIOPSY Right 12/05/2017   Procedure: SIMPLE MASTECTOMY WITH WIRE LOCALIZATION AND  TARGETED RIGHT AXILLARY SENTINEL NODE MAPPING  AND BIOPSY ERAS PATHWAY;  Surgeon: Erica Luna, MD;  Location: [place];  Service: General;  Laterality: Right;  PECTORAL BLOCK  . TONSILLECTOMY  1996    There were no vitals filed for this visit.  Subjective Assessment - 02/04/18 1745    Subjective  Pt states she is doing fine. She has had some pain in right chest at work when turning in awkward positions doing her job duties    Pertinent History  Patient was diagnosed on 11/01/17 with right grade 2 invasive ductal carcinoma breast cancer. It is ER/PR positive an HER2 negative with a ki67 of 2%. She has no other health problems. Patient underwent a right mastectomy and sentinel node biopsy (1/5 positive nodes) on 12/05/17.     Patient Stated  Goals  Make my arm back to normal    Currently in Pain?  No/denies                       [facility] Adult PT Treatment/Exercise - 02/04/18 0001      Shoulder Exercises: Supine   Other Supine Exercises  meeks decompression with emphasis on scapular retraction       Shoulder Exercises: Sidelying   ABduction  AROM;Right;10 reps    Other Sidelying Exercises  small circles pointing to ceiling trying to control arm and widen circles to stretch anterior chest       Shoulder Exercises: Standing   Other Standing Exercises  both elbows on foam foller and roll up the wall     Other Standing Exercises  right forearm on yellow ball for up the wall stretching of rrght shoulder and chest       Shoulder Exercises: Stretch   Other Shoulder Stretches  seated thoracic stretches     Other Shoulder Stretches  in supine with 2 pillows under thoracic spine and one pillow under head for arms out to the side stretch       Manual Therapy   Manual Lymphatic Drainage (MLD)  short neck, 5 diaphragmatic breaths, right  inguinal nodes and establishment of axillo inguinal pathway, left axillary nodes and establishment of interaxillary pathway, R lateral trunk working proximal to distal then retracing all steps    Passive ROM  Rt shoulder to pt's tolerance flexion, abduction, ER pt with less discomfort today during PROM                  PT Long Term Goals - 01/20/18 1506      PT LONG TERM GOAL #1   Title  Patient will demonstrate she has returned to baseline related to shoulder function and ROM.    Status  On-going      PT LONG TERM GOAL #2   Title  Patient will increase right shoulder active flexion ROM to >/= 150 degrees for increased ease reaching overhead.    Baseline  01/08/18- 152 degrees, 5/20/ 160    Status  Achieved      PT LONG TERM GOAL #3   Title  Patient will increase right shoulder active abduction ROM to >/= 150 degrees for increased ease reaching overhead.    Baseline   01/08/18- 128 degrees, 01/20/2018 160 degrees     Status  Achieved      PT LONG TERM GOAL #4   Title  Patient will reduce quick DASH score to </= 20 for increased shoulder function.    Status  On-going      PT LONG TERM GOAL #5   Title  Patient will verbalize understanding of risk reduction practices related to lymphedema.    Status  Achieved      PT LONG TERM GOAL #6   Title  Pt will report the tightness in her right pec major is improved by 75% so she can do her actiities at normal speed    Time  4    Period  [facility] Goals - 11/13/17 1121      Patient will be able to verbalize understanding of pertinent lymphedema risk reduction practices relevant to her diagnosis specifically related to skin care.   Time  1    Period  Days    Status  Achieved      Patient will be able to return demonstrate and/or verbalize understanding of the post-op home exercise program related to regaining shoulder range of motion.   Time  1    Period  Days    Status  Achieved      Patient will be able to verbalize understanding of the importance of attending the postoperative After Breast Cancer Class for further lymphedema risk reduction education and therapeutic exercise.   Time  1    Period  Days    Status  Achieved           Plan - 02/04/18 1756    Clinical Impression Statement  Pt appears to be doing well with shoulder movment during radiation.  She still has some fullness at right lateral chest. She reports she is doing home exercise and overall feels less stretch at right anterior pec area     PT Next Visit Plan  continue MLD for swelling of right axilla, lateral trunk and chest, continue AA/A/PROM to right shoulder and progress strengthening, assess indep with supine scap    Consulted and Agree with Plan of Care  Patient       Patient will benefit from skilled therapeutic intervention in order to improve the following deficits and impairments:  Pain,  Postural dysfunction, Decreased range of motion, Decreased knowledge of precautions, Impaired UE functional use, Increased fascial restricitons, Decreased scar mobility, Decreased strength  Visit Diagnosis: Abnormal posture  Stiffness of right shoulder, not elsewhere classified  Pain in right upper arm  Aftercare following surgery for neoplasm     Problem List Patient Active Problem List   Diagnosis Date Noted  . Genetic testing 12/06/2017  . Breast cancer, stage 2, right ([place]) 12/05/2017  . Family history of breast cancer   . Family history of colon cancer   . Family history of pancreatic cancer   . Malignant neoplasm of lower-outer quadrant of right breast of female, estrogen receptor positive ([place]) 11/08/2017  . Paresthesia 02/29/2016  . Obesity 02/29/2016  . Abdominal pain, right upper quadrant 10/05/2015  . Left knee pain 12/05/2012  . Allergic rhinitis 11/27/2011  . Cough 11/18/2011   Donato Heinz. Owens Shark PT   Norwood Levo 02/04/2018, 5:58 PM  [place], [place], [address] Phone: [phone]   Fax:  [phone]  Name: Erica Higgins MRN: [id number] Date of Birth: 29-Sep-1970

## 2018-02-06 ENCOUNTER — Ambulatory Visit: Admitting: Physical Therapy

## 2018-02-06 ENCOUNTER — Encounter: Payer: Self-pay | Admitting: Physical Therapy

## 2018-02-06 DIAGNOSIS — Z483 Aftercare following surgery for neoplasm: Secondary | ICD-10-CM

## 2018-02-06 DIAGNOSIS — M25611 Stiffness of right shoulder, not elsewhere classified: Secondary | ICD-10-CM

## 2018-02-06 DIAGNOSIS — R293 Abnormal posture: Secondary | ICD-10-CM

## 2018-02-06 DIAGNOSIS — M79621 Pain in right upper arm: Secondary | ICD-10-CM

## 2018-02-06 NOTE — Therapy (Signed)
[place], [place], [address] Phone: [phone]   Fax:  [phone]  Physical Therapy Treatment  Patient Details  Name: Erica Higgins MRN: [id number] Date of Birth: 08-23-1971 Referring Provider: Dr Erroll Luna   Encounter Date: 02/06/2018  PT End of Session - 02/06/18 1856    Visit Number  16    Number of Visits  20    Date for PT Re-Evaluation  02/20/18    PT Start Time  1610    PT Stop Time  1650    PT Time Calculation (min)  40 min    Activity Tolerance  Patient tolerated treatment well    Behavior During Therapy  [facility] for tasks assessed/performed       Past Medical History:  Diagnosis Date  . Abdominal pain, acute, right upper quadrant   . Anxiety   . Back pain   . Depression   . Joint pain   . Migraine   . Obesity   . Pain    Toes on bilateral feet  . Polycystic ovarian disease   . Sleep apnea     Past Surgical History:  Procedure Laterality Date  . CESAREAN SECTION    . MINISCUS REPAIR  2003 and 2005  . SIMPLE MASTECTOMY WITH AXILLARY SENTINEL NODE BIOPSY Right 12/05/2017   Procedure: SIMPLE MASTECTOMY WITH WIRE LOCALIZATION AND  TARGETED RIGHT AXILLARY SENTINEL NODE MAPPING  AND BIOPSY ERAS PATHWAY;  Surgeon: Erroll Luna, MD;  Location: [facility];  Service: General;  Laterality: Right;  PECTORAL BLOCK  . TONSILLECTOMY  1996    There were no vitals filed for this visit.  Subjective Assessment - 02/06/18 1619    Subjective  Pt says things are going well.  She has heard that her nighttime garment is in.     Pertinent History  Patient was diagnosed on 11/01/17 with right grade 2 invasive ductal carcinoma breast cancer. It is ER/PR positive an HER2 negative with a ki67 of 2%. She has no other health problems. Patient underwent a right mastectomy and sentinel node biopsy (1/5 positive nodes) on 12/05/17.     Patient Stated Goals  Make my arm back to normal    Currently  in Pain?  No/denies            LYMPHEDEMA/ONCOLOGY QUESTIONNAIRE - 02/06/18 1626      Right Upper Extremity Lymphedema   15 cm Proximal to Olecranon Process  47 cm    10 cm Proximal to Olecranon Process  45 cm    Olecranon Process  31 cm    15 cm Proximal to Ulnar Styloid Process  31.5 cm    10 cm Proximal to Ulnar Styloid Process  28.5 cm    Just Proximal to Ulnar Styloid Process  17.8 cm    Across Hand at [employer]  19.5 cm    At [place] of 2nd Digit  6.5 cm                [facility] Adult PT Treatment/Exercise - 02/06/18 0001      Shoulder Exercises: Pulleys   Flexion  2 minutes    Scaption  2 minutes      Shoulder Exercises: Stretch   Cross Chest Stretch  1 rep;10 seconds each side    Wall Stretch - Flexion  1 rep;10 seconds each side    Wall Stretch - ABduction  1 rep;10 seconds each side    Other Shoulder Stretches  tricep stretch on each side      Manual Therapy   Edema Management  provided thick foam to lateral chest inside binder. Called [facility] and pt has on order a custom flat knit sleve and glove and Tribute night     Manual Lymphatic Drainage (MLD)  short neck, superficial and deep abdominals, anterior interaxillary anastamosis, right shoulder and upper arm , then to sidelying for posterior interaxillary anastamosis laterl trunk and posterior upper arm     Passive ROM  to right shoulder in extremes of ROM                  PT Long Term Goals - 01/20/18 1506      PT LONG TERM GOAL #1   Title  Patient will demonstrate she has returned to baseline related to shoulder function and ROM.    Status  On-going      PT LONG TERM GOAL #2   Title  Patient will increase right shoulder active flexion ROM to >/= 150 degrees for increased ease reaching overhead.    Baseline  01/08/18- 152 degrees, 5/20/ 160    Status  Achieved      PT LONG TERM GOAL #3   Title  Patient will increase right shoulder active abduction ROM to >/= 150 degrees for  increased ease reaching overhead.    Baseline  01/08/18- 128 degrees, 01/20/2018 160 degrees     Status  Achieved      PT LONG TERM GOAL #4   Title  Patient will reduce quick DASH score to </= 20 for increased shoulder function.    Status  On-going      PT LONG TERM GOAL #5   Title  Patient will verbalize understanding of risk reduction practices related to lymphedema.    Status  Achieved      PT LONG TERM GOAL #6   Title  Pt will report the tightness in her right pec major is improved by 75% so she can do her actiities at normal speed    Time  4    Period  [facility] Goals - 11/13/17 1121      Patient will be able to verbalize understanding of pertinent lymphedema risk reduction practices relevant to her diagnosis specifically related to skin care.   Time  1    Period  Days    Status  Achieved      Patient will be able to return demonstrate and/or verbalize understanding of the post-op home exercise program related to regaining shoulder range of motion.   Time  1    Period  Days    Status  Achieved      Patient will be able to verbalize understanding of the importance of attending the postoperative After Breast Cancer Class for further lymphedema risk reduction education and therapeutic exercise.   Time  1    Period  Days    Status  Achieved           Plan - 02/06/18 1856    Clinical Impression Statement  Pt has increased circumference in right upper arm and it appears to be fuller and firmer to palpation.  She has appears to have fullness in lateral upper chest.  Applied foam to this area under binder.  Encouraged to go to [facility] [employer] [place] to get a compression bra    PT Next Visit Plan  continue MLD for  swelling of right axilla, lateral trunk and chest, continue AA/A/PROM to right shoulder and progress strengthening, assess indep with supine scap    Consulted and Agree with Plan of Care  Patient       Patient will benefit from skilled  therapeutic intervention in order to improve the following deficits and impairments:  Pain, Postural dysfunction, Decreased range of motion, Decreased knowledge of precautions, Impaired UE functional use, Increased fascial restricitons, Decreased scar mobility, Decreased strength  Visit Diagnosis: Abnormal posture  Stiffness of right shoulder, not elsewhere classified  Pain in right upper arm  Aftercare following surgery for neoplasm     Problem List Patient Active Problem List   Diagnosis Date Noted  . Genetic testing 12/06/2017  . Breast cancer, stage 2, right ([place]) 12/05/2017  . Family history of breast cancer   . Family history of colon cancer   . Family history of pancreatic cancer   . Malignant neoplasm of lower-outer quadrant of right breast of female, estrogen receptor positive ([place]) 11/08/2017  . Paresthesia 02/29/2016  . Obesity 02/29/2016  . Abdominal pain, right upper quadrant 10/05/2015  . Left knee pain 12/05/2012  . Allergic rhinitis 11/27/2011  . Cough 11/18/2011   Donato Heinz. Owens Shark PT  Norwood Levo 02/06/2018, 7:00 PM  [place], [place], [address] Phone: [phone]   Fax:  [phone]  Name: Erica Higgins MRN: [id number] Date of Birth: May 26, 1971

## 2018-02-10 ENCOUNTER — Encounter: Payer: Self-pay | Admitting: Physical Therapy

## 2018-02-10 ENCOUNTER — Ambulatory Visit: Admitting: Physical Therapy

## 2018-02-10 DIAGNOSIS — M79621 Pain in right upper arm: Secondary | ICD-10-CM

## 2018-02-10 DIAGNOSIS — R293 Abnormal posture: Secondary | ICD-10-CM

## 2018-02-10 DIAGNOSIS — M25611 Stiffness of right shoulder, not elsewhere classified: Secondary | ICD-10-CM

## 2018-02-10 NOTE — Therapy (Signed)
[place], [place], [address] Phone: [phone]   Fax:  [phone]  Physical Therapy Treatment  Patient Details  Name: Erica Higgins MRN: [id number] Date of Birth: [birth year] Referring Provider: Dr Erroll Luna   Encounter Date: 02/10/2018  PT End of Session - 02/10/18 1107    Visit Number  17    Number of Visits  20    Date for PT Re-Evaluation  02/20/18    PT Start Time  6203 pt arrived late    PT Stop Time  0932    PT Time Calculation (min)  37 min    Activity Tolerance  Patient tolerated treatment well    Behavior During Therapy  [facility] for tasks assessed/performed       Past Medical History:  Diagnosis Date  . Abdominal pain, acute, right upper quadrant   . Anxiety   . Back pain   . Depression   . Joint pain   . Migraine   . Obesity   . Pain    Toes on bilateral feet  . Polycystic ovarian disease   . Sleep apnea     Past Surgical History:  Procedure Laterality Date  . CESAREAN SECTION    . MINISCUS REPAIR  2003 and 2005  . SIMPLE MASTECTOMY WITH AXILLARY SENTINEL NODE BIOPSY Right 12/05/2017   Procedure: SIMPLE MASTECTOMY WITH WIRE LOCALIZATION AND  TARGETED RIGHT AXILLARY SENTINEL NODE MAPPING  AND BIOPSY ERAS PATHWAY;  Surgeon: Erroll Luna, MD;  Location: [facility];  Service: General;  Laterality: Right;  PECTORAL BLOCK  . TONSILLECTOMY  1996    There were no vitals filed for this visit.  Subjective Assessment - 02/10/18 0856    Subjective  I have not gotten my night time garments yet. My shoulder is feeling good but it was a little tight and achy under the arm over the weekend.     Pertinent History  Patient was diagnosed on 11/01/17 with right grade 2 invasive ductal carcinoma breast cancer. It is ER/PR positive an HER2 negative with a ki67 of 2%. She has no other health problems. Patient underwent a right mastectomy and sentinel node biopsy (1/5 positive nodes) on  12/05/17.     Patient Stated Goals  Make my arm back to normal    Currently in Pain?  No/denies    Pain Score  0-No pain         [facility] PT Assessment - 02/10/18 0001      AROM   Right Shoulder Flexion  160 Degrees    Right Shoulder ABduction  161 Degrees                   [facility] Adult PT Treatment/Exercise - 02/10/18 0001      Shoulder Exercises: Pulleys   Flexion  2 minutes    Scaption  2 minutes      Shoulder Exercises: Therapy Ball   Flexion  10 reps    ABduction  10 reps;Right to increase stretch      Manual Therapy   Edema Management  brief myofascial to area of tightness in upper arm where pt feels some cording though it is not palpable    Manual Lymphatic Drainage (MLD)  short neck, superficial and deep abdominals, anterior interaxillary anastamosis, right shoulder and upper arm , then to sidelying for posterior interaxillary anastamosis laterl trunk and posterior upper arm     Passive ROM  to right shoulder in extremes of  ROM                  PT Long Term Goals - 01/20/18 1506      PT LONG TERM GOAL #1   Title  Patient will demonstrate she has returned to baseline related to shoulder function and ROM.    Status  On-going      PT LONG TERM GOAL #2   Title  Patient will increase right shoulder active flexion ROM to >/= 150 degrees for increased ease reaching overhead.    Baseline  01/08/18- 152 degrees, 5/20/ 160    Status  Achieved      PT LONG TERM GOAL #3   Title  Patient will increase right shoulder active abduction ROM to >/= 150 degrees for increased ease reaching overhead.    Baseline  01/08/18- 128 degrees, 01/20/2018 160 degrees     Status  Achieved      PT LONG TERM GOAL #4   Title  Patient will reduce quick DASH score to </= 20 for increased shoulder function.    Status  On-going      PT LONG TERM GOAL #5   Title  Patient will verbalize understanding of risk reduction practices related to lymphedema.    Status  Achieved      PT  LONG TERM GOAL #6   Title  Pt will report the tightness in her right pec major is improved by 75% so she can do her actiities at normal speed    Time  4    Period  [facility] Goals - 11/13/17 1121      Patient will be able to verbalize understanding of pertinent lymphedema risk reduction practices relevant to her diagnosis specifically related to skin care.   Time  1    Period  Days    Status  Achieved      Patient will be able to return demonstrate and/or verbalize understanding of the post-op home exercise program related to regaining shoulder range of motion.   Time  1    Period  Days    Status  Achieved      Patient will be able to verbalize understanding of the importance of attending the postoperative After Breast Cancer Class for further lymphedema risk reduction education and therapeutic exercise.   Time  1    Period  Days    Status  Achieved           Plan - 02/10/18 1200    Clinical Impression Statement  Continued MLD to area of fullness in upper arm and right lateral trunk. She has been wearing foam in her binder to help control this. She is still having some tightness at end range. She has several more weeks of radiation to complete. Discussed with pt about decreasing to 1x/wk while pt completes radiation to help pt from loosing ROM and continue to improve tightness. Pt was agreeable to this.     Rehab Potential  Excellent    Clinical Impairments Affecting Rehab Potential  None    PT Frequency  1x / week    PT Duration  4 weeks    PT Treatment/Interventions  ADLs/Self Care Home Management;Therapeutic exercise;Therapeutic activities;Patient/family education;Manual techniques;Manual lymph drainage;Scar mobilization;Passive range of motion    PT Next Visit Plan  continue MLD for swelling of right axilla, lateral trunk and chest, continue AA/A/PROM to right shoulder and progress strengthening, assess indep with  supine scap    PT Home Exercise  Plan  Shoulder ROM HEP, supine scap    Consulted and Agree with Plan of Care  Patient       Patient will benefit from skilled therapeutic intervention in order to improve the following deficits and impairments:  Pain, Postural dysfunction, Decreased range of motion, Decreased knowledge of precautions, Impaired UE functional use, Increased fascial restricitons, Decreased scar mobility, Decreased strength  Visit Diagnosis: Abnormal posture  Stiffness of right shoulder, not elsewhere classified  Pain in right upper arm     Problem List Patient Active Problem List   Diagnosis Date Noted  . Genetic testing 12/06/2017  . Breast cancer, stage 2, right ([place]) 12/05/2017  . Family history of breast cancer   . Family history of colon cancer   . Family history of pancreatic cancer   . Malignant neoplasm of lower-outer quadrant of right breast of female, estrogen receptor positive ([place]) 11/08/2017  . Paresthesia 02/29/2016  . Obesity 02/29/2016  . Abdominal pain, right upper quadrant 10/05/2015  . Left knee pain 12/05/2012  . Allergic rhinitis 11/27/2011  . Cough 11/18/2011    Allyson Sabal [facility] 02/10/2018, 12:01 PM  [place], [place], [address] Phone: [phone]   Fax:  [phone]  Name: Erica Higgins MRN: [id number] Date of Birth: July 11, 1971  Allyson Sabal [place], PT 02/10/18 12:02 PM

## 2018-02-12 ENCOUNTER — Ambulatory Visit: Admitting: Physical Therapy

## 2018-02-18 ENCOUNTER — Ambulatory Visit: Admitting: Rehabilitation

## 2018-02-18 ENCOUNTER — Encounter: Payer: Self-pay | Admitting: Rehabilitation

## 2018-02-18 DIAGNOSIS — Z483 Aftercare following surgery for neoplasm: Secondary | ICD-10-CM

## 2018-02-18 DIAGNOSIS — C50511 Malignant neoplasm of lower-outer quadrant of right female breast: Secondary | ICD-10-CM

## 2018-02-18 DIAGNOSIS — R293 Abnormal posture: Secondary | ICD-10-CM | POA: Diagnosis not present

## 2018-02-18 DIAGNOSIS — Z170 Estrogen receptor positive status [ER+]: Secondary | ICD-10-CM

## 2018-02-18 DIAGNOSIS — M25611 Stiffness of right shoulder, not elsewhere classified: Secondary | ICD-10-CM

## 2018-02-18 DIAGNOSIS — M79621 Pain in right upper arm: Secondary | ICD-10-CM

## 2018-02-18 NOTE — Therapy (Signed)
[place], [place], [address] Phone: [phone]   Fax:  [phone]  Physical Therapy Treatment  Patient Details  Name: Erica Higgins MRN: [id number] Date of Birth: [birth year] Referring Provider: Dr Erroll Luna   Encounter Date: 02/18/2018  PT End of Session - 02/18/18 0929    Visit Number  18    Number of Visits  20    Date for PT Re-Evaluation  02/20/18    PT Start Time  0845    PT Stop Time  0927    PT Time Calculation (min)  42 min    Activity Tolerance  Patient tolerated treatment well    Behavior During Therapy  [facility] for tasks assessed/performed       Past Medical History:  Diagnosis Date  . Abdominal pain, acute, right upper quadrant   . Anxiety   . Back pain   . Depression   . Joint pain   . Migraine   . Obesity   . Pain    Toes on bilateral feet  . Polycystic ovarian disease   . Sleep apnea     Past Surgical History:  Procedure Laterality Date  . CESAREAN SECTION    . MINISCUS REPAIR  2003 and 2005  . SIMPLE MASTECTOMY WITH AXILLARY SENTINEL NODE BIOPSY Right 12/05/2017   Procedure: SIMPLE MASTECTOMY WITH WIRE LOCALIZATION AND  TARGETED RIGHT AXILLARY SENTINEL NODE MAPPING  AND BIOPSY ERAS PATHWAY;  Surgeon: Erroll Luna, MD;  Location: [facility];  Service: General;  Laterality: Right;  PECTORAL BLOCK  . TONSILLECTOMY  1996    There were no vitals filed for this visit.  Subjective Assessment - 02/18/18 0853    Subjective  Getting my sleeve today and hopefully a radiation bra and camisole.  Still unhappy with the compression bra      Pertinent History  Patient was diagnosed on 11/01/17 with right grade 2 invasive ductal carcinoma breast cancer. It is ER/PR positive an HER2 negative with a ki67 of 2%. She has no other health problems. Patient underwent a right mastectomy and sentinel node biopsy (1/5 positive nodes) on 12/05/17.     Currently in Pain?  No/denies             LYMPHEDEMA/ONCOLOGY QUESTIONNAIRE - 02/18/18 0931      Right Upper Extremity Lymphedema   15 cm Proximal to Olecranon Process  46.8 cm    10 cm Proximal to Olecranon Process  45 cm                [facility] Adult PT Treatment/Exercise - 02/18/18 0001      Manual Therapy   Manual Lymphatic Drainage (MLD)  short neck, superficial and deep abdominals, anterior interaxillary anastamosis, right shoulder and upper arm , then to sidelying for posterior interaxillary anastamosis laterl trunk and posterior upper arm     Passive ROM  to right shoulder in extremes of ROM                  PT Long Term Goals - 02/18/18 0932      PT LONG TERM GOAL #1   Title  Patient will demonstrate she has returned to baseline related to shoulder function and ROM.    Status  On-going      PT LONG TERM GOAL #2   Title  Patient will increase right shoulder active flexion ROM to >/= 150 degrees for increased ease reaching overhead.    Status  Achieved  PT LONG TERM GOAL #3   Title  Patient will increase right shoulder active abduction ROM to >/= 150 degrees for increased ease reaching overhead.    Status  Achieved      PT LONG TERM GOAL #4   Title  Patient will reduce quick DASH score to </= 20 for increased shoulder function.    Status  On-going      PT LONG TERM GOAL #5   Title  Patient will verbalize understanding of risk reduction practices related to lymphedema.    Status  Achieved      PT LONG TERM GOAL #6   Title  Pt will report the tightness in her right pec major is improved by 75% so she can do her actiities at normal speed    Status  Partially [place] - 11/13/17 1121      Patient will be able to verbalize understanding of pertinent lymphedema risk reduction practices relevant to her diagnosis specifically related to skin care.   Time  1    Period  Days    Status  Achieved      Patient will be able to return demonstrate and/or  verbalize understanding of the post-op home exercise program related to regaining shoulder range of motion.   Time  1    Period  Days    Status  Achieved      Patient will be able to verbalize understanding of the importance of attending the postoperative After Breast Cancer Class for further lymphedema risk reduction education and therapeutic exercise.   Time  1    Period  Days    Status  Achieved           Plan - 02/18/18 0930    Clinical Impression Statement  continues with fullness and tightness in the Rt UE/upper arm improved with manual work/MLD.  SHould be getting compression garment today and will look at some new radiation camisoles/bras as compression is starting to get uncomfortable.      PT Frequency  1x / week    PT Duration  4 weeks    PT Treatment/Interventions  ADLs/Self Care Home Management;Therapeutic exercise;Therapeutic activities;Patient/family education;Manual techniques;Manual lymph drainage;Scar mobilization;Passive range of motion    PT Next Visit Plan  needs recert;  continue MLD for swelling of right axilla, lateral trunk and chest, continue AA/A/PROM to right shoulder and progress strengthening, assess indep with supine scap    PT Home Exercise Plan  Shoulder ROM HEP, supine scap    Consulted and Agree with Plan of Care  Patient       Patient will benefit from skilled therapeutic intervention in order to improve the following deficits and impairments:  Pain, Postural dysfunction, Decreased range of motion, Decreased knowledge of precautions, Impaired UE functional use, Increased fascial restricitons, Decreased scar mobility, Decreased strength  Visit Diagnosis: Abnormal posture  Stiffness of right shoulder, not elsewhere classified  Pain in right upper arm  Aftercare following surgery for neoplasm  Malignant neoplasm of lower-outer quadrant of right breast of female, estrogen receptor positive ([place])     Problem List Patient Active Problem List    Diagnosis Date Noted  . Genetic testing 12/06/2017  . Breast cancer, stage 2, right ([place]) 12/05/2017  . Family history of breast cancer   . Family history of colon cancer   . Family history of pancreatic cancer   . Malignant neoplasm of lower-outer quadrant of right breast of female, estrogen receptor  positive ([place]) 11/08/2017  . Paresthesia 02/29/2016  . Obesity 02/29/2016  . Abdominal pain, right upper quadrant 10/05/2015  . Left knee pain 12/05/2012  . Allergic rhinitis 11/27/2011  . Cough 11/18/2011    Shan Levans, PT 02/18/2018, 9:32 AM  [place], [place], [address] Phone: [phone]   Fax:  [phone]  Name: Erica Higgins MRN: [id number] Date of Birth: 10-22-1970

## 2018-02-21 ENCOUNTER — Encounter: Admitting: Physical Therapy

## 2018-02-25 ENCOUNTER — Encounter: Payer: Self-pay | Admitting: Rehabilitation

## 2018-02-25 ENCOUNTER — Ambulatory Visit: Admitting: Rehabilitation

## 2018-02-25 DIAGNOSIS — Z170 Estrogen receptor positive status [ER+]: Secondary | ICD-10-CM

## 2018-02-25 DIAGNOSIS — R293 Abnormal posture: Secondary | ICD-10-CM | POA: Diagnosis not present

## 2018-02-25 DIAGNOSIS — M79621 Pain in right upper arm: Secondary | ICD-10-CM

## 2018-02-25 DIAGNOSIS — M25611 Stiffness of right shoulder, not elsewhere classified: Secondary | ICD-10-CM

## 2018-02-25 DIAGNOSIS — Z483 Aftercare following surgery for neoplasm: Secondary | ICD-10-CM

## 2018-02-25 DIAGNOSIS — C50511 Malignant neoplasm of lower-outer quadrant of right female breast: Secondary | ICD-10-CM

## 2018-02-25 NOTE — Therapy (Signed)
[place], [place], [address] Phone: [phone]   Fax:  [phone]  Physical Therapy Treatment  Patient Details  Name: Erica Higgins MRN: [id number] Date of Birth: [birth year] Referring Provider: Dr Erroll Luna   Encounter Date: 02/25/2018  PT End of Session - 02/25/18 0955    Visit Number  19    Number of Visits  20    Date for PT Re-Evaluation  02/20/18    PT Start Time  0800    PT Stop Time  0843    PT Time Calculation (min)  43 min    Activity Tolerance  Patient tolerated treatment well    Behavior During Therapy  [facility] for tasks assessed/performed       Past Medical History:  Diagnosis Date  . Abdominal pain, acute, right upper quadrant   . Anxiety   . Back pain   . Depression   . Joint pain   . Migraine   . Obesity   . Pain    Toes on bilateral feet  . Polycystic ovarian disease   . Sleep apnea     Past Surgical History:  Procedure Laterality Date  . CESAREAN SECTION    . MINISCUS REPAIR  2003 and 2005  . SIMPLE MASTECTOMY WITH AXILLARY SENTINEL NODE BIOPSY Right 12/05/2017   Procedure: SIMPLE MASTECTOMY WITH WIRE LOCALIZATION AND  TARGETED RIGHT AXILLARY SENTINEL NODE MAPPING  AND BIOPSY ERAS PATHWAY;  Surgeon: Erroll Luna, MD;  Location: [facility];  Service: General;  Laterality: Right;  PECTORAL BLOCK  . TONSILLECTOMY  1996    There were no vitals filed for this visit.  Subjective Assessment - 02/25/18 0803    Subjective  I got my new sleeve but it can make my hand feel cold.  Got a radiation tank top.  My shoulder is doing better now.  No more muscle spasms.  I am just worried I am going to be swollen     Pertinent History  Patient was diagnosed on 11/01/17 with right grade 2 invasive ductal carcinoma breast cancer. It is ER/PR positive an HER2 negative with a ki67 of 2%. She has no other health problems. Patient underwent a right mastectomy and sentinel node  biopsy (1/5 positive nodes) on 12/05/17.     Currently in Pain?  No/denies         [facility] PT Assessment - 02/25/18 0001      AROM   Right Shoulder Flexion  160 Degrees    Right Shoulder ABduction  161 Degrees        LYMPHEDEMA/ONCOLOGY QUESTIONNAIRE - 02/25/18 0819      Right Upper Extremity Lymphedema   10 cm Proximal to Olecranon Process  45 cm    Olecranon Process  30.5 cm    15 cm Proximal to Ulnar Styloid Process  31.4 cm    10 cm Proximal to Ulnar Styloid Process  27.3 cm    Just Proximal to Ulnar Styloid Process  19 cm    Across Hand at [employer]  19.7 cm    At [place] of 2nd Digit  6.8 cm                [facility] Adult PT Treatment/Exercise - 02/25/18 0001      Manual Therapy   Manual Lymphatic Drainage (MLD)  brief MLD work to upper shoulder and arm.  using stationary circles and working proximal to distal to the elbow and then returning  Passive ROM  to Right shoulder flexion focus.              PT Education - 02/25/18 0948    Education provided  Yes    Education Details  final instruction of starting to wear the compression from waking up every day for 1-2 weeks to see if any feelings of upper arm tightness dissapear.  Also to contact [facility] if the hand continues to feel cold.  Continue with stretches    Person(s) Educated  Patient    Methods  Explanation    Comprehension  Verbalized understanding          PT Long Term Goals - 02/25/18 0816      PT LONG TERM GOAL #1   Title  Patient will demonstrate she has returned to baseline related to shoulder function and ROM.    Status  Achieved      PT LONG TERM GOAL #2   Title  Patient will increase right shoulder active flexion ROM to >/= 150 degrees for increased ease reaching overhead.    Status  Achieved      PT LONG TERM GOAL #3   Title  Patient will increase right shoulder active abduction ROM to >/= 150 degrees for increased ease reaching overhead.    Status  Achieved       PT LONG TERM GOAL #4   Title  Patient will reduce quick DASH score to </= 20 for increased shoulder function.      PT LONG TERM GOAL #5   Title  Patient will verbalize understanding of risk reduction practices related to lymphedema.    Status  Achieved      PT LONG TERM GOAL #6   Title  Pt will report the tightness in her right pec major is improved by 75% so she can do her actiities at normal speed    Status  Achieved      Breast Clinic Goals - 11/13/17 1121      Patient will be able to verbalize understanding of pertinent lymphedema risk reduction practices relevant to her diagnosis specifically related to skin care.   Time  1    Period  Days    Status  Achieved      Patient will be able to return demonstrate and/or verbalize understanding of the post-op home exercise program related to regaining shoulder range of motion.   Time  1    Period  Days    Status  Achieved      Patient will be able to verbalize understanding of the importance of attending the postoperative After Breast Cancer Class for further lymphedema risk reduction education and therapeutic exercise.   Time  1    Period  Days    Status  Achieved           Plan - 02/25/18 0955    Clinical Impression Statement  Erica Higgins reports the shoulder feels good overall. NO pain and no continued pectoralis spasms and pain.  Working without limitations.  Does have some continued pulling in the upper arm with end range passive flexion.  Pts biggest concerns are not getting lymphedema. She does have some increased fullness in the upper extremity but her measurements have remained consistent and she has a night and daytime compression.  She will return with any problems or difficulties when finishing up with radiation.         Patient will benefit from skilled therapeutic intervention in order to improve the  following deficits and impairments:     Visit Diagnosis: Abnormal posture  Stiffness of right shoulder, not elsewhere  classified  Pain in right upper arm  Aftercare following surgery for neoplasm  Malignant neoplasm of lower-outer quadrant of right breast of female, estrogen receptor positive ([place])     Problem List Patient Active Problem List   Diagnosis Date Noted  . Genetic testing 12/06/2017  . Breast cancer, stage 2, right ([place]) 12/05/2017  . Family history of breast cancer   . Family history of colon cancer   . Family history of pancreatic cancer   . Malignant neoplasm of lower-outer quadrant of right breast of female, estrogen receptor positive ([place]) 11/08/2017  . Paresthesia 02/29/2016  . Obesity 02/29/2016  . Abdominal pain, right upper quadrant 10/05/2015  . Left knee pain 12/05/2012  . Allergic rhinitis 11/27/2011  . Cough 11/18/2011    Shan Levans, PT 02/25/2018, 12:07 PM  [place], [place], [address] Phone: [phone]   Fax:  [phone]  Name: Erica Higgins MRN: [id number] Date of Birth: 08-Aug-1971   PHYSICAL THERAPY DISCHARGE SUMMARY  Visits from Start of Care: 19  Current functional level related to goals / functional outcomes: See above   Remaining deficits: Tightness in shoulder flexion due to radiation soreness and left over cording    Education / Equipment: ind with self care for now Plan: Patient agrees to discharge.  Patient goals were met. Patient is being discharged due to meeting the stated rehab goals.  ?????

## 2018-02-28 ENCOUNTER — Encounter: Admitting: Physical Therapy

## 2018-03-03 ENCOUNTER — Encounter: Admitting: Physical Therapy

## 2018-03-05 ENCOUNTER — Encounter: Admitting: Physical Therapy

## 2018-03-10 ENCOUNTER — Encounter: Admitting: Physical Therapy

## 2018-03-12 ENCOUNTER — Encounter

## 2018-03-31 ENCOUNTER — Inpatient Hospital Stay

## 2018-03-31 ENCOUNTER — Inpatient Hospital Stay: Attending: Hematology and Oncology | Admitting: Hematology and Oncology

## 2018-03-31 ENCOUNTER — Telehealth: Payer: Self-pay | Admitting: Hematology and Oncology

## 2018-03-31 DIAGNOSIS — C50511 Malignant neoplasm of lower-outer quadrant of right female breast: Secondary | ICD-10-CM

## 2018-03-31 DIAGNOSIS — Z923 Personal history of irradiation: Secondary | ICD-10-CM | POA: Diagnosis not present

## 2018-03-31 DIAGNOSIS — Z9011 Acquired absence of right breast and nipple: Secondary | ICD-10-CM | POA: Insufficient documentation

## 2018-03-31 DIAGNOSIS — Z170 Estrogen receptor positive status [ER+]: Secondary | ICD-10-CM | POA: Insufficient documentation

## 2018-03-31 NOTE — Telephone Encounter (Signed)
Gave patient avs and calendar of upcoming oct appts. Patient scheduled in non scp slot due to avail of patients schedule.

## 2018-03-31 NOTE — Assessment & Plan Note (Signed)
12/05/17: Right mastectomy: Invasive ductal carcinoma 1.1 cm with lymphovascular invasion, 1/5 lymph nodes positive, grade 2, ER 90%, PR 100%, Ki-67 2%, HER-2 negative T1bN1 Stage 1B Mammaprint 12/21/2017: Luminal type A low risk Adjuvant radiation therapy completed  Plan: With tamoxifen 20 mg daily anti estrogen therapy x10 years  Tamoxifen counseling: We discussed the risks and benefits of tamoxifen. These include but not limited to insomnia, hot flashes, mood changes, vaginal dryness, and weight gain. Although rare, serious side effects including endometrial cancer, risk of blood clots were also discussed. We strongly believe that the benefits far outweigh the risks. Patient understands these risks and consented to starting treatment. Planned treatment duration is 10 years.  Return to clinic in 3 months for survivorship care plan visit and toxicity evaluation.

## 2018-03-31 NOTE — Progress Notes (Signed)
Patient Care Team: Berenice Primas as PCP - General (Nurse Practitioner) Chipper Herb, MD as Referring Physician (Family Medicine) Cristal Deer, DPM as Attending Physician (Podiatry)  DIAGNOSIS:  Encounter Diagnosis  Name Primary?  . Malignant neoplasm of lower-outer quadrant of right breast of female, estrogen receptor positive ([place])     SUMMARY OF ONCOLOGIC HISTORY:   Malignant neoplasm of lower-outer quadrant of right breast of female, estrogen receptor positive ([place])   11/06/2017 Initial Diagnosis    Palpable right breast mass which on radiologic evaluation was epidermoid cyst; incidentally found additional right breast mass at 8 o'clock position: 1.6 cm: IDC grade 2, ER 90%, PR 100%, Ki-67 2%, HER-2 negative, T1CN0 stage I a AJCC 8      11/25/2017 Genetic Testing    The Common Hereditary Cancer Panel offered by [facility] includes sequencing and/or deletion duplication testing of the following 47 genes: APC, ATM, AXIN2, BARD1, BMPR1A, BRCA1, BRCA2, BRIP1, CDH1, CDKN2A (p14ARF), CDKN2A (p16INK4a), CKD4, CHEK2, CTNNA1, DICER1, EPCAM (Deletion/duplication testing only), GREM1 (promoter region deletion/duplication testing only), KIT, MEN1, MLH1, MSH2, MSH3, MSH6, MUTYH, NBN, NF1, NHTL1, PALB2, PDGFRA, PMS2, POLD1, POLE, PTEN, RAD50, RAD51C, RAD51D, SDHB, SDHC, SDHD, SMAD4, SMARCA4. STK11, TP53, TSC1, TSC2, and VHL.  The following genes were evaluated for sequence changes only: SDHA and HOXB13 c.251G>A variant only.  Results: Negative, no pathogenic variants identified.  The date of this test report is 11/25/2017.       12/05/2017 Surgery    Right mastectomy: Invasive ductal carcinoma 1.1 cm with lymphovascular invasion, 1/5 lymph nodes positive, grade 2, ER 90%, PR 100%, Ki-67 2%, HER-2 negative T1bN1 Stage 1B      01/31/2018 - 03/14/2018 Radiation Therapy    Adjuvant XRT at [facility]       CHIEF COMPLIANT: Follow-up after radiation therapy  INTERVAL HISTORY: Erica Higgins is a  47 year old who underwent mastectomy and radiation and is here for follow-up.  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  She is working full-time and has been able to function even throughout radiation therapy.  REVIEW OF SYSTEMS:   Constitutional: Denies fevers, chills or abnormal weight loss Eyes: Denies blurriness of vision Ears, nose, mouth, throat, and face: Denies mucositis or sore throat Respiratory: Denies cough, dyspnea or wheezes Cardiovascular: Denies palpitation, chest discomfort Gastrointestinal:  Denies nausea, heartburn or change in bowel habits Skin: Denies abnormal skin rashes Lymphatics: Denies new lymphadenopathy or easy bruising Neurological:Denies numbness, tingling or new weaknesses Behavioral/Psych: Mood is stable, no new changes  Extremities: No lower extremity edema Breast: Right mastectomy followed by radiation All other systems were reviewed with the patient and are negative.  I have reviewed the past medical history, past surgical history, social history and family history with the patient and they are unchanged from previous note.  ALLERGIES:  is allergic to cleocin [clindamycin hcl]; ivp dye [iodinated diagnostic agents]; latex; penicillins; shellfish allergy; and adhesive [tape].  MEDICATIONS:  Current Outpatient Medications  Medication Sig Dispense Refill  . diazepam (VALIUM) 2 MG tablet TAKE ONE TABLET BY MOUTH EVERY TWELVE HOURS AS NEEDED  2  . ibuprofen (ADVIL,MOTRIN) 800 MG tablet Take 800 mg by mouth every 8 (eight) hours as needed.    Marland Kitchen ibuprofen (ADVIL,MOTRIN) 800 MG tablet Take 1 tablet (800 mg total) by mouth every 8 (eight) hours as needed. 30 tablet 0  . zolmitriptan (ZOMIG-ZMT) 5 MG disintegrating tablet Take 5 mg by mouth as needed.      No current facility-administered medications  for this visit.     PHYSICAL EXAMINATION: ECOG PERFORMANCE STATUS: 1 - Symptomatic but completely ambulatory  Vitals:   03/31/18 1118    BP: 114/65  Pulse: 69  Resp: 20  Temp: 98.6 F (37 C)  SpO2: 99%   Filed Weights   03/31/18 1118  Weight: 264 lb 14.4 oz (120.2 kg)    GENERAL:alert, no distress and comfortable SKIN: skin color, texture, turgor are normal, no rashes or significant lesions EYES: normal, Conjunctiva are pink and non-injected, sclera clear OROPHARYNX:no exudate, no erythema and lips, buccal mucosa, and tongue normal  NECK: supple, thyroid normal size, non-tender, without nodularity LYMPH:  no palpable lymphadenopathy in the cervical, axillary or inguinal LUNGS: clear to auscultation and percussion with normal breathing effort HEART: regular rate & rhythm and no murmurs and no lower extremity edema ABDOMEN:abdomen soft, non-tender and normal bowel sounds MUSCULOSKELETAL:no cyanosis of digits and no clubbing  NEURO: alert & oriented x 3 with fluent speech, no focal motor/sensory deficits EXTREMITIES: No lower extremity edema   LABORATORY DATA:  I have reviewed the data as listed CMP Latest Ref Rng & Units 11/13/2017 02/29/2016 10/05/2015  Glucose 70 - 140 mg/dL 123 102(H) -  BUN 7 - 26 mg/dL 12 13 -  Creatinine 0.60 - 1.10 mg/dL 0.84 0.74 -  Sodium 136 - 145 mmol/L 139 140 -  Potassium 3.5 - 5.1 mmol/L 3.8 3.8 -  Chloride 98 - 109 mmol/L 104 98 -  CO2 22 - 29 mmol/L 29 24 -  Calcium 8.4 - 10.4 mg/dL 9.4 9.1 -  Total Protein 6.4 - 8.3 g/dL 7.4 7.2 6.8  Total Bilirubin 0.2 - 1.2 mg/dL 0.5 <0.2 0.5  Alkaline Phos 40 - 150 U/L 87 94 67  AST 5 - 34 U/L _0 ALT 0 - 55 U/L _1 Lab Results  Component Value Date   WBC 8.4 11/13/2017   HGB 13.7 11/13/2017   HCT 41.5 11/13/2017   MCV 87.5 11/13/2017   PLT 295 11/13/2017   NEUTROABS 5.8 11/13/2017    ASSESSMENT & PLAN:  Malignant neoplasm of lower-outer quadrant of right breast of female, estrogen receptor positive ([place]) 12/05/17: Right mastectomy: Invasive ductal carcinoma 1.1 cm with lymphovascular invasion, 1/5 lymph nodes  positive, grade 2, ER 90%, PR 100%, Ki-67 2%, HER-2 negative T1bN1 Stage 1B Mammaprint 12/21/2017: Luminal type A low risk Adjuvant radiation therapy completed  Plan: With tamoxifen 20 mg daily anti estrogen therapy x10 years Patient has not had a menstrual cycle in several years since she had ablation. I would like to check if she is in menopause.  We will send her to the lab for [facility] and estradiol levels checked. Depending on these results I will call in a prescription for either tamoxifen or anastrozole.   If she is on anastrozole then we can evaluate her for participation in clinical trial [facility] in 3 months   Tamoxifen counseling: We discussed the risks and benefits of tamoxifen. These include but not limited to insomnia, hot flashes, mood changes, vaginal dryness, and weight gain. Although rare, serious side effects including endometrial cancer, risk of blood clots were also discussed. We strongly believe that the benefits far outweigh the risks. Patient understands these risks and consented to starting treatment. Planned treatment duration is 10 years.  Return to clinic in 3 months for survivorship care plan visit and toxicity evaluation.  Orders Placed This Encounter  Procedures  . Follicle stimulating hormone  Standing Status:   Future    Standing Expiration Date:   05/05/2019  . Estradiol, Ultra Sens   The patient has a good understanding of the overall plan. she agrees with it. she will call with any problems that may develop before the next visit here.   Harriette Ohara, MD 03/31/18

## 2018-04-01 ENCOUNTER — Telehealth: Payer: Self-pay

## 2018-04-01 ENCOUNTER — Other Ambulatory Visit: Payer: Self-pay | Admitting: Hematology and Oncology

## 2018-04-01 LAB — FOLLICLE STIMULATING HORMONE: FSH: 7.7 m[IU]/mL

## 2018-04-01 MED ORDER — TAMOXIFEN CITRATE 20 MG PO TABS: 20.0000 mg | ORAL_TABLET | Freq: Every day | ORAL | 3 refills | Status: DC

## 2018-04-01 NOTE — Telephone Encounter (Signed)
Returned pt's call regarding recent lab results from yesterday.  Told pt her not all of her lab results are not back yet and when they are- Dr Lindi Adie or nurse can call her.  No other needs per pt at this time.

## 2018-04-01 NOTE — Progress Notes (Signed)
I informed the patient that the [facility] was 7.7. Patient is premenopausal. Based on this I recommended that she take tamoxifen. I will send a prescription for tamoxifen to her drug store.

## 2018-04-02 LAB — ESTRADIOL, ULTRA SENS: ESTRADIOL, SENSITIVE: 296.5 pg/mL

## 2018-06-26 ENCOUNTER — Telehealth: Payer: Self-pay

## 2018-06-26 NOTE — Telephone Encounter (Signed)
LVM for pt reminding of SCP visit with NP on 06/30/18 at 9:30 am

## 2018-06-30 ENCOUNTER — Encounter: Payer: Self-pay | Admitting: Adult Health

## 2018-06-30 ENCOUNTER — Telehealth: Payer: Self-pay | Admitting: Hematology and Oncology

## 2018-06-30 ENCOUNTER — Inpatient Hospital Stay: Attending: Adult Health | Admitting: Adult Health

## 2018-06-30 VITALS — BP 118/98 | HR 78 | Temp 98.7°F | Resp 18 | Ht 62.0 in | Wt 257.0 lb

## 2018-06-30 DIAGNOSIS — Z9011 Acquired absence of right breast and nipple: Secondary | ICD-10-CM | POA: Insufficient documentation

## 2018-06-30 DIAGNOSIS — Z923 Personal history of irradiation: Secondary | ICD-10-CM | POA: Diagnosis not present

## 2018-06-30 DIAGNOSIS — Z87891 Personal history of nicotine dependence: Secondary | ICD-10-CM | POA: Insufficient documentation

## 2018-06-30 DIAGNOSIS — Z23 Encounter for immunization: Secondary | ICD-10-CM | POA: Diagnosis not present

## 2018-06-30 DIAGNOSIS — Z170 Estrogen receptor positive status [ER+]: Secondary | ICD-10-CM | POA: Insufficient documentation

## 2018-06-30 DIAGNOSIS — Z79810 Long term (current) use of selective estrogen receptor modulators (SERMs): Secondary | ICD-10-CM | POA: Insufficient documentation

## 2018-06-30 DIAGNOSIS — C50511 Malignant neoplasm of lower-outer quadrant of right female breast: Secondary | ICD-10-CM | POA: Insufficient documentation

## 2018-06-30 MED ORDER — INFLUENZA VAC SPLIT QUAD 0.5 ML IM SUSY
PREFILLED_SYRINGE | INTRAMUSCULAR | Status: AC
  Filled 2018-06-30: qty 0.5

## 2018-06-30 MED ORDER — INFLUENZA VAC SPLIT QUAD 0.5 ML IM SUSY
0.5000 mL | PREFILLED_SYRINGE | Freq: Once | INTRAMUSCULAR | Status: AC
  Administered 2018-06-30: 0.5 mL via INTRAMUSCULAR

## 2018-06-30 NOTE — Patient Instructions (Signed)

## 2018-06-30 NOTE — Telephone Encounter (Signed)
Gave avs  ° °

## 2018-06-30 NOTE — Progress Notes (Addendum)
CLINIC:  Survivorship   REASON FOR VISIT:  Routine follow-up post-treatment for a recent history of breast cancer.  BRIEF ONCOLOGIC HISTORY:    Malignant neoplasm of lower-outer quadrant of right breast of female, estrogen receptor positive ([place])   11/06/2017 Initial Diagnosis    Palpable right breast mass which on radiologic evaluation was epidermoid cyst; incidentally found additional right breast mass at 8 o'clock position: 1.6 cm: IDC grade 2, ER 90%, PR 100%, Ki-67 2%, HER-2 negative, T1CN0 stage I a AJCC 8    11/25/2017 Genetic Testing    The Common Hereditary Cancer Panel offered by [facility] includes sequencing and/or deletion duplication testing of the following 47 genes: APC, ATM, AXIN2, BARD1, BMPR1A, BRCA1, BRCA2, BRIP1, CDH1, CDKN2A (p14ARF), CDKN2A (p16INK4a), CKD4, CHEK2, CTNNA1, DICER1, EPCAM (Deletion/duplication testing only), GREM1 (promoter region deletion/duplication testing only), KIT, MEN1, MLH1, MSH2, MSH3, MSH6, MUTYH, NBN, NF1, NHTL1, PALB2, PDGFRA, PMS2, POLD1, POLE, PTEN, RAD50, RAD51C, RAD51D, SDHB, SDHC, SDHD, SMAD4, SMARCA4. STK11, TP53, TSC1, TSC2, and VHL.  The following genes were evaluated for sequence changes only: SDHA and HOXB13 c.251G>A variant only.  Results: Negative, no pathogenic variants identified.  The date of this test report is 11/25/2017.     12/05/2017 Surgery    Right mastectomy: Invasive ductal carcinoma 1.1 cm with lymphovascular invasion, 1/5 lymph nodes positive, grade 2, ER 90%, PR 100%, Ki-67 2%, HER-2 negative T1bN1 Stage 1B.  Mammaprint low risk    01/31/2018 - 03/14/2018 Radiation Therapy    Adjuvant XRT at [facility]    04/2018 -  Anti-estrogen oral therapy    Tamoxifen daily    06/25/2018 Cancer Staging    Staging form: Breast, AJCC 8th Edition - Pathologic: Stage IA (pT1b, pN1, cM0, G2, ER+, PR+, HER2-) - Signed by Gardenia Phlegm, NP on 06/25/2018     INTERVAL HISTORY:  Ms. Zaun presents to the [facility] today for  our initial meeting to review her survivorship care plan detailing her treatment course for breast cancer, as well as monitoring long-term side effects of that treatment, education regarding health maintenance, screening, and overall wellness and health promotion.     Overall, Ms. Coletta reports feeling quite well.  She is taking Tamoxifen daily.  She is experiencing hot flashes, and noted these have been present since prior to her taking Tamoxifen.  She is managing these well on her own.  She is has had occasional spotting.  Her last menstrual cycle was in June, 2011 (s/p ablation).  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  She has continued to have her usual migraines, however notes they are slightly more frequent.  No associated symptoms, or change in severity.   REVIEW OF SYSTEMS:  Review of Systems  Constitutional: Negative for appetite change, chills, fatigue, fever and unexpected weight change.  HENT:   Negative for hearing loss, lump/mass and trouble swallowing.   Eyes: Negative for eye problems and icterus.  Respiratory: Negative for chest tightness, cough and shortness of breath.   Cardiovascular: Negative for chest pain, leg swelling and palpitations.  Gastrointestinal: Negative for abdominal distention, abdominal pain, constipation, diarrhea, nausea and vomiting.  Endocrine: Negative for hot flashes.  Musculoskeletal: Negative for arthralgias.  Skin: Negative for itching and rash.  Neurological: Negative for dizziness, extremity weakness, headaches and numbness.  Hematological: Negative for adenopathy. Does not bruise/bleed easily.  Psychiatric/Behavioral: Negative for depression. The patient is not nervous/anxious.   Breast: Denies any new nodularity, masses, tenderness,  nipple changes, or nipple discharge.      ONCOLOGY TREATMENT TEAM:  1. Surgeon:  Dr. Brantley Stage at [facility] 2. Medical Oncologist: Dr. Lindi Adie  3. Radiation  Oncologist: Dr. Quitman Livings    PAST MEDICAL/SURGICAL HISTORY:  Past Medical History:  Diagnosis Date  . Abdominal pain, acute, right upper quadrant   . Anxiety   . Back pain   . Depression   . Joint pain   . Migraine   . Obesity   . Pain    Toes on bilateral feet  . Polycystic ovarian disease   . Sleep apnea    Past Surgical History:  Procedure Laterality Date  . CESAREAN SECTION    . MINISCUS REPAIR  2003 and 2005  . SIMPLE MASTECTOMY WITH AXILLARY SENTINEL NODE BIOPSY Right 12/05/2017   Procedure: SIMPLE MASTECTOMY WITH WIRE LOCALIZATION AND  TARGETED RIGHT AXILLARY SENTINEL NODE MAPPING  AND BIOPSY ERAS PATHWAY;  Surgeon: Erroll Luna, MD;  Location: [place];  Service: General;  Laterality: Right;  PECTORAL BLOCK  . TONSILLECTOMY  1996     ALLERGIES:  Allergies  Allergen Reactions  . Cleocin [Clindamycin Hcl] Hives  . Ivp Dye [Iodinated Diagnostic Agents] Anaphylaxis  . Latex Hives  . Penicillins Hives  . Shellfish Allergy Anaphylaxis  . Adhesive [Tape]     Rash, itch      CURRENT MEDICATIONS:  Outpatient Encounter Medications as of 06/30/2018  Medication Sig Note  . diazepam (VALIUM) 2 MG tablet TAKE ONE TABLET BY MOUTH EVERY TWELVE HOURS AS NEEDED   . doxycycline (VIBRA-TABS) 100 MG tablet Patient taking 1 po daily x 2 months (started 9/22)   . ibuprofen (ADVIL,MOTRIN) 800 MG tablet Take 800 mg by mouth every 8 (eight) hours as needed.   Marland Kitchen ibuprofen (ADVIL,MOTRIN) 800 MG tablet Take 1 tablet (800 mg total) by mouth every 8 (eight) hours as needed.   . tamoxifen (NOLVADEX) 20 MG tablet Take 1 tablet (20 mg total) by mouth daily.   Marland Kitchen zolmitriptan (ZOMIG-ZMT) 5 MG disintegrating tablet Take 5 mg by mouth as needed.  10/13/2015: Received from: External Pharmacy   No facility-administered encounter medications on file as of 06/30/2018.      ONCOLOGIC FAMILY HISTORY:  Family History  Problem Relation Age of Onset  . Emphysema Father         smoker  . Allergies Father   . Hyperlipidemia Father   . Hypertension Father   . Allergies Mother   . Hypertension Mother   . Rheum arthritis Mother   . Osteoporosis Mother   . Skin cancer Mother   . Breast cancer Other   . Heart attack Paternal Grandfather 17  . Colon cancer Paternal Aunt        dx 50's/60's  . Pancreatic cancer Maternal Grandmother 31  . Colon cancer Paternal Uncle 73  . Skin cancer Paternal Grandmother   . Breast cancer Cousin        dx 30's/40's  . Lung cancer Paternal Uncle      GENETIC COUNSELING/TESTING: See above  SOCIAL HISTORY:  Social History   Socioeconomic History  . Marital status: Single    Spouse name: Not on file  . Number of children: 2  . Years of education: Bachelors  . Highest education level: Not on file  Occupational History  . Occupation: Mental Health Tech - [place]  . Financial resource strain: Not on file  . Food insecurity:    Worry: Not on  file    Inability: Not on file  . Transportation needs:    Medical: Not on file    Non-medical: Not on file  Tobacco Use  . Smoking status: Former Smoker    Packs/day: 0.30    Years: 2.00    Pack years: 0.60    Types: Cigarettes    Last attempt to quit: 09/03/1992    Years since quitting: 25.8  . Smokeless tobacco: Never Used  Substance and Sexual Activity  . Alcohol use: Yes    Alcohol/week: 0.0 standard drinks  . Drug use: No  . Sexual activity: Not on file  Lifestyle  . Physical activity:    Days per week: Not on file    Minutes per session: Not on file  . Stress: Not on file  Relationships  . Social connections:    Talks on phone: Not on file    Gets together: Not on file    Attends religious service: Not on file    Active member of club or organization: Not on file    Attends meetings of clubs or organizations: Not on file    Relationship status: Not on file  . Intimate partner violence:    Fear of current or ex partner: Not on file    Emotionally  abused: Not on file    Physically abused: Not on file    Forced sexual activity: Not on file  Other Topics Concern  . Not on file  Social History Narrative   Lives at home with her two children (twins).   Right-handed.   2 cups caffeine per day.       PHYSICAL EXAMINATION:  Vital Signs:   Vitals:   06/30/18 0929  BP: (!) 118/98  Pulse: 78  Resp: 18  Temp: 98.7 F (37.1 C)  SpO2: 97%   Filed Weights   06/30/18 0929  Weight: 257 lb (116.6 kg)   General: Well-nourished, well-appearing female in no acute distress.  She is unaccompanied today.   HEENT: Head is normocephalic.  Pupils equal and reactive to light. Conjunctivae clear without exudate.  Sclerae anicteric. Oral mucosa is pink, moist.  Oropharynx is pink without lesions or erythema.  Lymph: No cervical, supraclavicular, or infraclavicular lymphadenopathy noted on palpation.  Cardiovascular: Regular rate and rhythm.Marland Kitchen Respiratory: Clear to auscultation bilaterally. Chest expansion symmetric; breathing non-labored.  Breast: right breast s/p mastectomy and radiation, no nodules noted, no sign of local recurrence, left breast benign GI: Abdomen soft and round; non-tender, non-distended. Bowel sounds normoactive.  GU: Deferred.  Neuro: No focal deficits. Steady gait.  Psych: Mood and affect normal and appropriate for situation.  Extremities: No edema. MSK: No focal spinal tenderness to palpation.  Full range of motion in bilateral upper extremities Skin: Warm and dry.  LABORATORY DATA:  None for this visit.  DIAGNOSTIC IMAGING:  None for this visit.      ASSESSMENT AND PLAN:  Ms.. Salmons is a pleasant 47 y.o. female with Stage IA right breast invasive ductal carcinoma, ER+/PR+/HER2-, diagnosed in 10/2017, treated with mastectomy, adjuvant radiation therapy, and anti-estrogen therapy with Tamoxifen beginning in 04/2018.  She presents to the Survivorship Clinic for our initial meeting and routine follow-up post-completion  of treatment for breast cancer.    1. Stage IA right breast cancer:  Ms. Matlack is continuing to recover from definitive treatment for breast cancer. She will follow-up with her medical oncologist, Dr. Lindi Adie in 10/2018 with history and physical exam per surveillance protocol.  She will continue  her anti-estrogen therapy with Anastrozole. Thus far, she is tolerating the Anastrozole well, with minimal side effects. Today, a comprehensive survivorship care plan and treatment summary was reviewed with the patient today detailing her breast cancer diagnosis, treatment course, potential late/long-term effects of treatment, appropriate follow-up care with recommendations for the future, and patient education resources.  A copy of this summary, along with a letter will be sent to the patient's primary care provider via mail/fax/In Basket message after today's visit.    2. Bone health:  Given Ms. Ricciardelli's history of breast cancer, she is at risk for bone demineralization. I counseled her that Tamoxifen has a protective effect on her bones.  She was given education on specific activities to promote bone health.  3. Cancer screening:  Due to Ms. Flater's history and her age, she should receive screening for skin cancers, colon cancer, and gynecologic cancers.  The information and recommendations are listed on the patient's comprehensive care plan/treatment summary and were reviewed in detail with the patient.    4. Health maintenance and wellness promotion: Ms. Junio was encouraged to consume 5-7 servings of fruits and vegetables per day. We reviewed the "Nutrition Rainbow" handout, as well as the handout "Take Control of Your Health and Reduce Your Cancer Risk" from the [facility].  She was also encouraged to engage in moderate to vigorous exercise for 30 minutes per day most days of the week. We discussed the [employer] fitness program, which is designed for cancer survivors to help them become more  physically fit after cancer treatments.  She was instructed to limit her alcohol consumption and continue to abstain from tobacco use.     5. Support services/counseling: It is not uncommon for this period of the patient's cancer care trajectory to be one of many emotions and stressors.  We discussed an opportunity for her to participate in the next session of [facility] ("Finding Your New Normal") support group series designed for patients after they have completed treatment.   Ms. Blackston was encouraged to take advantage of our many other support services programs, support groups, and/or counseling in coping with her new life as a cancer survivor after completing anti-cancer treatment.  She was offered support today through active listening and expressive supportive counseling.  She was given information regarding our available services and encouraged to contact me with any questions or for help enrolling in any of our support group/programs.   6. Headaches: Will monitor for now.  If she develops symptoms, or if they worsen/become more frequent, I will order brain MRI.  We reviewed this today in detail and she knows to call if those happen, or if she has any other questions or concerns.   Dispo:   -Return to cancer center for f/u with Dr. Lindi Adie in 10/2018 -Mammogram due in 09/2018 (ordered by radiation oncology) -Follow up with Dr. Brantley Stage in 04/2019 -She is welcome to return back to the Survivorship Clinic at any time; no additional follow-up needed at this time.  -Consider referral back to survivorship as a long-term survivor for continued surveillance  A total of (30) minutes of face-to-face time was spent with this patient with greater than 50% of that time in counseling and care-coordination.   Gardenia Phlegm, [place] [phone]   Note: PRIMARY CARE PROVIDER Berenice Primas [phone]

## 2018-09-11 ENCOUNTER — Encounter: Payer: Self-pay | Admitting: Pulmonary Disease

## 2018-09-11 ENCOUNTER — Ambulatory Visit (INDEPENDENT_AMBULATORY_CARE_PROVIDER_SITE_OTHER): Admitting: Pulmonary Disease

## 2018-09-11 VITALS — BP 112/76 | HR 99 | Ht 63.0 in | Wt 252.0 lb

## 2018-09-11 DIAGNOSIS — R918 Other nonspecific abnormal finding of lung field: Secondary | ICD-10-CM | POA: Diagnosis not present

## 2018-09-11 LAB — COMPREHENSIVE METABOLIC PANEL
ALT: 27 U/L (ref 0–35)
AST: 22 U/L (ref 0–37)
Albumin: 3.7 g/dL (ref 3.5–5.2)
Alkaline Phosphatase: 69 U/L (ref 39–117)
BUN: 9 mg/dL (ref 6–23)
CO2: 27 meq/L (ref 19–32)
Calcium: 8.9 mg/dL (ref 8.4–10.5)
Chloride: 106 mEq/L (ref 96–112)
Creatinine, Ser: 0.82 mg/dL (ref 0.40–1.20)
GFR: 79.91 mL/min (ref >60.00)
Glucose, Bld: 137 mg/dL — ABNORMAL HIGH (ref 70–99)
Potassium: 3.8 mEq/L (ref 3.5–5.1)
Sodium: 140 mEq/L (ref 135–145)
Total Bilirubin: 0.4 mg/dL (ref 0.2–1.2)
Total Protein: 6.9 g/dL (ref 6.0–8.3)

## 2018-09-11 LAB — CBC WITH DIFFERENTIAL/PLATELET
Basophils Absolute: 0.1 10*3/uL (ref 0.0–0.1)
Basophils Relative: 0.9 % (ref 0.0–3.0)
Eosinophils Absolute: 0.1 10*3/uL (ref 0.0–0.7)
Eosinophils Relative: 1.7 % (ref 0.0–5.0)
HCT: 39.4 % (ref 36.0–46.0)
Hemoglobin: 13.2 g/dL (ref 12.0–15.0)
Lymphocytes Relative: 18.4 % (ref 12.0–46.0)
Lymphs Abs: 1.2 10*3/uL (ref 0.7–4.0)
MCHC: 33.4 g/dL (ref 30.0–36.0)
MCV: 87.6 fl (ref 78.0–100.0)
Monocytes Absolute: 0.4 10*3/uL (ref 0.1–1.0)
Monocytes Relative: 5.4 % (ref 3.0–12.0)
NEUTROS ABS: 4.9 10*3/uL (ref 1.4–7.7)
Neutrophils Relative %: 73.6 % (ref 43.0–77.0)
Platelets: 331 10*3/uL (ref 150.0–400.0)
RBC: 4.5 Mil/uL (ref 3.87–5.11)
RDW: 12.9 % (ref 11.5–15.5)
WBC: 6.6 10*3/uL (ref 4.0–10.5)

## 2018-09-11 LAB — C-REACTIVE PROTEIN: CRP: 2.3 mg/dL (ref 0.5–20.0)

## 2018-09-11 MED ORDER — PREDNISONE 10 MG PO TABS: ORAL_TABLET | ORAL | 0 refills | Status: DC

## 2018-09-11 MED ORDER — BENZONATATE 200 MG PO CAPS: 200.0000 mg | ORAL_CAPSULE | Freq: Two times a day (BID) | ORAL | 1 refills | Status: DC | PRN

## 2018-09-11 NOTE — Addendum Note (Signed)
Addended by: Suzzanne Cloud E on: 09/11/2018 09:01 AM   Modules accepted: Orders
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
## 2018-09-11 NOTE — Patient Instructions (Addendum)
We will give you a more prolonged prednisone course Start prednisone at 40 mg.  Reduce dose by 10 mg every 5 days We will check some labs including CBC differential, comprehensive metabolic panel, ANA, rheumatoid factor, CCP, hypersensitivity panel, IgE,   For your cough will prescribe Tessalon Perls 200 mg twice daily as needed, continue to use over-the-counter medication such as Delsym  Follow-up in 4 weeks with chest x-ray.

## 2018-09-14 LAB — ANA,IFA RA DIAG PNL W/RFLX TIT/PATN
ANA: NEGATIVE
Cyclic Citrullin Peptide Ab: <16 UNITS
Rheumatoid fact SerPl-aCnc: <14 IU/mL (ref <14)

## 2018-09-14 LAB — IGE: IGE (IMMUNOGLOBULIN E), SERUM: 152 kU/L — AB (ref <=114)

## 2018-09-15 LAB — HYPERSENSITIVITY PNEUMONITIS
A. Pullulans Abs: POSITIVE — AB
A.Fumigatus #1 Abs: NEGATIVE
Micropolyspora faeni, IgG: NEGATIVE
Pigeon Serum Abs: NEGATIVE
Thermoact. Saccharii: NEGATIVE
Thermoactinomyces vulgaris, IgG: NEGATIVE

## 2018-09-17 LAB — PROCALCITONIN: Procalcitonin: <0.1 ng/mL (ref <0.10)

## 2018-09-18 ENCOUNTER — Telehealth: Payer: Self-pay | Admitting: Pulmonary Disease

## 2018-09-18 DIAGNOSIS — R918 Other nonspecific abnormal finding of lung field: Secondary | ICD-10-CM

## 2018-09-18 NOTE — Telephone Encounter (Signed)
Received lab results.  Did not receive the result for IGA antibody lab.  Fax [phone].  Patient phone number [phone].

## 2018-09-18 NOTE — Telephone Encounter (Signed)
Spoke with pt, she would like Dr. Vaughan Browner to call her back on [phone]. She will await your return call.

## 2018-09-18 NOTE — Telephone Encounter (Signed)
Reviewed lab results with patient showing elevated IgE and positive hypersensitivity panel for A [place] Patient reports mold exposure in 2013 but no recent exposure.  No humidifiers. Advised her to get her home environment checked out.  Also has slight elevation in blood glucose which we will follow-up with her primary care She is feeling significantly better with the prednisone taper.  She will follow-up as scheduled at the end of this month with a repeat chest x-ray She requested that we fax the results to Dr. Woody Seller, primary care office.  Marshell Garfinkel MD [facility] 09/18/2018, 10:19 AM

## 2018-09-18 NOTE — Telephone Encounter (Signed)
Spoke with pt, she states she has what she needed. I advised her we didn't have a IGA result. Pt understood and nothing further is needed.

## 2018-09-18 NOTE — Telephone Encounter (Signed)
Results have been faxed to Dr. Woody Seller via epic. Nothing further is needed.

## 2018-09-18 NOTE — Telephone Encounter (Signed)
Dr. Vaughan Browner has spoke with pt. Please see duplicate message.

## 2018-10-02 ENCOUNTER — Encounter: Payer: Self-pay | Admitting: Primary Care

## 2018-10-02 ENCOUNTER — Telehealth: Payer: Self-pay | Admitting: Hematology and Oncology

## 2018-10-02 ENCOUNTER — Ambulatory Visit (INDEPENDENT_AMBULATORY_CARE_PROVIDER_SITE_OTHER)
Admission: RE | Admit: 2018-10-02 | Discharge: 2018-10-02 | Disposition: A | Discharge status: Home / Self Care | Source: Ambulatory Visit | Attending: Pulmonary Disease | Admitting: Pulmonary Disease

## 2018-10-02 ENCOUNTER — Ambulatory Visit (INDEPENDENT_AMBULATORY_CARE_PROVIDER_SITE_OTHER): Admitting: Primary Care

## 2018-10-02 DIAGNOSIS — R9389 Abnormal findings on diagnostic imaging of other specified body structures: Secondary | ICD-10-CM | POA: Insufficient documentation

## 2018-10-02 DIAGNOSIS — R918 Other nonspecific abnormal finding of lung field: Secondary | ICD-10-CM

## 2018-10-02 NOTE — Progress Notes (Signed)
Patient Care Team: Berenice Primas as PCP - General (Nurse Practitioner) Cristal Deer, DPM as Attending Physician (Podiatry) Nicholas Lose, MD as Consulting Physician (Hematology and Oncology) Erroll Luna, MD as Consulting Physician (General Surgery) Audry Pili, MD as Referring Physician (Radiation Oncology) Gardenia Phlegm, NP as Nurse Practitioner (Hematology and Oncology)  DIAGNOSIS:    ICD-10-CM   1. Malignant neoplasm of lower-outer quadrant of right breast of female, estrogen receptor positive ([place]) C50.511    Z17.0   2. Breast cancer, stage 2, right ([place]) C50.911 Ambulatory referral to Physical Therapy    SUMMARY OF ONCOLOGIC HISTORY:   Malignant neoplasm of lower-outer quadrant of right breast of female, estrogen receptor positive ([place])   11/06/2017 Initial Diagnosis    Palpable right breast mass which on radiologic evaluation was epidermoid cyst; incidentally found additional right breast mass at 8 o'clock position: 1.6 cm: IDC grade 2, ER 90%, PR 100%, Ki-67 2%, HER-2 negative, T1CN0 stage I a AJCC 8    11/25/2017 Genetic Testing    The Common Hereditary Cancer Panel offered by [facility] includes sequencing and/or deletion duplication testing of the following 47 genes: APC, ATM, AXIN2, BARD1, BMPR1A, BRCA1, BRCA2, BRIP1, CDH1, CDKN2A (p14ARF), CDKN2A (p16INK4a), CKD4, CHEK2, CTNNA1, DICER1, EPCAM (Deletion/duplication testing only), GREM1 (promoter region deletion/duplication testing only), KIT, MEN1, MLH1, MSH2, MSH3, MSH6, MUTYH, NBN, NF1, NHTL1, PALB2, PDGFRA, PMS2, POLD1, POLE, PTEN, RAD50, RAD51C, RAD51D, SDHB, SDHC, SDHD, SMAD4, SMARCA4. STK11, TP53, TSC1, TSC2, and VHL.  The following genes were evaluated for sequence changes only: SDHA and HOXB13 c.251G>A variant only.  Results: Negative, no pathogenic variants identified.  The date of this test report is 11/25/2017.     12/05/2017 Surgery    Right mastectomy: Invasive ductal carcinoma 1.1 cm  with lymphovascular invasion, 1/5 lymph nodes positive, grade 2, ER 90%, PR 100%, Ki-67 2%, HER-2 negative T1bN1 Stage 1B.  Mammaprint low risk    01/31/2018 - 03/14/2018 Radiation Therapy    Adjuvant XRT at [facility]    04/2018 -  Anti-estrogen oral therapy    Tamoxifen daily    06/25/2018 Cancer Staging    Staging form: Breast, AJCC 8th Edition - Pathologic: Stage IA (pT1b, pN1, cM0, G2, ER+, PR+, HER2-) - Signed by Gardenia Phlegm, NP on 06/25/2018     CHIEF COMPLIANT: Follow-up of Tamoxifen  INTERVAL HISTORY: Erica Higgins is a 48 y.o. with above-mentioned history of right breast cancer treated with mastectomy and radiation and is currently on tamoxifen therapy. She presents to the clinic today for f/u.  She notes an increased rash on her legs x 2 months and an increase in folliculitis.  She isn't sure if it could be related to the Tamoxifen.  The folliculitis was so severe she had to see dermatology.     She wants to know if she can take Skinny Detox tea.  She also wants a referral to PT about her lymphedema sleeve. She underwent a left breast screening mammogram in 09/2018 that showed no evidence of malignancy.    REVIEW OF SYSTEMS:   Review of Systems  Constitutional: Negative for appetite change, chills, fatigue and unexpected weight change.  HENT:   Negative for hearing loss, lump/mass, mouth sores, sore throat and trouble swallowing.   Eyes: Negative for eye problems and icterus.  Respiratory: Negative for chest tightness, cough and shortness of breath.   Cardiovascular: Negative for chest pain, leg swelling and palpitations.  Gastrointestinal: Negative for abdominal distention, abdominal pain, constipation, diarrhea, nausea  and vomiting.  Endocrine: Negative for hot flashes.  Skin: Positive for rash. Negative for itching.  Neurological: Negative for dizziness, extremity weakness, headaches and numbness.  Hematological: Negative for adenopathy. Does not bruise/bleed easily.    Psychiatric/Behavioral: Negative for depression. The patient is not nervous/anxious.      I have reviewed the past medical history, past surgical history, social history and family history with the patient and they are unchanged from previous note.  ALLERGIES:  is allergic to cleocin [clindamycin hcl]; ivp dye [iodinated diagnostic agents]; latex; penicillins; shellfish allergy; and adhesive [tape].  MEDICATIONS:  Current Outpatient Medications  Medication Sig Dispense Refill  . diazepam (VALIUM) 2 MG tablet TAKE ONE TABLET BY MOUTH EVERY TWELVE HOURS AS NEEDED  2  . doxycycline (VIBRA-TABS) 100 MG tablet Take 100 mg by mouth daily. If needed for problems    . ibuprofen (ADVIL,MOTRIN) 800 MG tablet Take 800 mg by mouth every 8 (eight) hours as needed.    . tamoxifen (NOLVADEX) 20 MG tablet Take 1 tablet (20 mg total) by mouth daily. 90 tablet 3  . zolmitriptan (ZOMIG-ZMT) 5 MG disintegrating tablet Take 5 mg by mouth as needed.      No current facility-administered medications for this visit.     PHYSICAL EXAMINATION: ECOG PERFORMANCE STATUS: 1 - Symptomatic but completely ambulatory  Vitals:   10/07/18 1428  BP: 126/88  Pulse: 84  Resp: 17  Temp: 98.8 F (37.1 C)  SpO2: 100%   Filed Weights   10/07/18 1428  Weight: 256 lb 9.6 oz (116.4 kg)    GENERAL: Patient is a well appearing female in no acute distress HEENT:  Sclerae anicteric.  Oropharynx clear and moist. No ulcerations or evidence of oropharyngeal candidiasis. Neck is supple.  NODES:  No cervical, supraclavicular, or axillary lymphadenopathy palpated.  BREAST EXAM:  Right breast s/p mastectomy, no sign of local recurrence, left breast benign LUNGS:  Clear to auscultation bilaterally.  No wheezes or rhonchi. HEART:  Regular rate and rhythm. No murmur appreciated. ABDOMEN:  Soft, nontender.  Positive, normoactive bowel sounds. No organomegaly palpated. MSK:  No focal spinal tenderness to palpation. Full range of  motion bilaterally in the upper extremities. EXTREMITIES:  No peripheral edema.   SKIN:  Clear with no obvious rashes or skin changes. No nail dyscrasia. NEURO:  Nonfocal. Well oriented.  Appropriate affect.    LABORATORY DATA:  I have reviewed the data as listed CMP Latest Ref Rng & Units 09/11/2018 11/13/2017 02/29/2016  Glucose 70 - 99 mg/dL 137(H) 123 102(H)  BUN 6 - 23 mg/dL _0 Creatinine 0.40 - 1.20 mg/dL 0.82 0.84 0.74  Sodium 135 - 145 mEq/L 140 139 140  Potassium 3.5 - 5.1 mEq/L 3.8 3.8 3.8  Chloride 96 - 112 mEq/L 106 104 98  CO2 19 - 32 mEq/L _1 Calcium 8.4 - 10.5 mg/dL 8.9 9.4 9.1  Total Protein 6.0 - 8.3 g/dL 6.9 7.4 7.2  Total Bilirubin 0.2 - 1.2 mg/dL 0.4 0.5 <0.2  Alkaline Phos 39 - 117 U/L 69 87 94  AST 0 - 37 U/L _2 ALT 0 - 35 U/L _3 Lab Results  Component Value Date   WBC 6.6 09/11/2018   HGB 13.2 09/11/2018   HCT 39.4 09/11/2018   MCV 87.6 09/11/2018   PLT 331.0 09/11/2018   NEUTROABS 4.9 09/11/2018    ASSESSMENT & PLAN:  Malignant neoplasm of lower-outer quadrant of right  breast of female, estrogen receptor positive ([place]) 12/05/17: Right mastectomy: Invasive ductal carcinoma 1.1 cm with lymphovascular invasion, 1/5 lymph nodes positive, grade 2, ER 90%, PR 100%, Ki-67 2%, HER-2 negative T1bN1 Stage 1B Mammaprint 12/21/2017: Luminal type A low risk Adjuvant radiation therapy completed  Plan: With tamoxifen 20 mg daily anti estrogen therapy x10 years  Saryna has no clinical or radiographic sign of breast cancer recurrence.  She is tolerating tamoxifen moderately well and will continue it.  She will fax me the results of her mammogram.  I would like to review it personally and see her breast density category.  She will continue with annual mammograms.  We discussed whether or not the rash and hydradenitis issues could be related to the Tamoxifen.  We discussed trialing off of the tamoxifen to see.  We reviewed that switching would  involve Zoladex every 4 weeks and Letrozole.  I gave her info on these today in her AVS to read about.  She really doesn't think that these issues warrant changing at this point.  She is going to consider it and will let us know if anything worsens.  I recommended she avoid the skinny detox tea since upon my review some of the ingredients are metabolized through the CYP2D6 pathway.    I placed a referral to PT at her request.  She will return in 1 year for f/u with Dr. Lindi Adie.  She will see her surgeon in 6 months.          Orders Placed This Encounter  Procedures  . Ambulatory referral to Physical Therapy    Referral Priority:   Routine    Referral Type:   Physical Medicine    Referral Reason:   Specialty Services Required    Requested Specialty:   Physical Therapy    Number of Visits Requested:   1   The patient has a good understanding of the overall plan. she agrees with it. she will call with any problems that may develop before the next visit here.  A total of (30) minutes of face-to-face time was spent with this patient with greater than 50% of that time in counseling and care-coordination.   Wilber Bihari, NP 10/07/2018

## 2018-10-02 NOTE — Telephone Encounter (Signed)
VG PAL 2/4 - moved f/u to [facility] at 2:30 pm. Left message for patient.

## 2018-10-02 NOTE — Patient Instructions (Signed)
Will call with CXR results later today or tomorrow  If abnormality persists we will discuss possible bronchoscopy  Follow up as needed, if symptoms return or worsen in any way

## 2018-10-02 NOTE — Assessment & Plan Note (Addendum)
-   Feels significantly better, occasional dry cough. NO shortness of breath, wheezing or fever.  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

## 2018-10-02 NOTE — Progress Notes (Signed)
@Patient  ID: Erica Higgins, female    DOB: 1971/01/03, 48 y.o.   MRN: [id number]  Chief Complaint  Patient presents with  . Follow-up    breathing better - slight dry cough - CXR completed today    Referring provider: Berenice Primas, NP  HPI: 48 year old female, former smoker. PMH significant for allergic rhinitis, cough, breast cancer, obesity. Patient of Dr. Vaughan Browner, seen for initial consult on 09/11/18. Images show consolidative changes suggestive of pneumonia and not of malignancy. Other considerations include noninfectious inflammatory process such as connective tissue disease. She does have history of radiation exposure for right side breast cancer, however, timeline does not suggest radiation pneumonitis. Given prolonged prednisone taper. Plan close follow up with chest xray later this month. If persistent abnormalities then bronchoscopy may be considered.    Previous Financial controller Encounters: 48 year old with past medical history of breast cancer status post mastectomy, chronic headaches, sleep apnea, allergies Diagnosed with pneumonia in early December with an abnormal chest x-ray.  She was given a course of Levaquin for 7 days and prednisone dosepak.  She continued to have persistent cough.  She was reevaluated at the end of December with a chest x-ray and CT scan which showed persistent right upper and middle lobe consolidation.  She was given azithromycin for 8 days. Continues to have nonproductive cough, no fevers, chills.  This is associated with dyspnea.  No complaints of wheeze. History noted for breast cancer status post mastectomy.  She also got radiation which was completed in July 2019  10/02/2018 Patient presents today for 3 week follow-up with CXR prior. Feels a lot better, still has slight dry cough. Completed prednisone taper. 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 States that she removed her humidifier that had pink/black mold on the filter. CXR results  pending. If abnormalities persist discussed potential need for bronchoscopy. She would be ok pursuing this. She is not anticoagulated. Denies shortness of breath or fever.     Allergies  Allergen Reactions  . Cleocin [Clindamycin Hcl] Hives  . Ivp Dye [Iodinated Diagnostic Agents] Anaphylaxis  . Latex Hives  . Penicillins Hives  . Shellfish Allergy Anaphylaxis  . Adhesive [Tape]     Rash, itch     Immunization History  Administered Date(s) Administered  . Influenza Split 06/04/2011  . Influenza,inj,Quad PF,6+ Mos 06/30/2018    Past Medical History:  Diagnosis Date  . Abdominal pain, acute, right upper quadrant   . Anxiety   . Back pain   . Depression   . Joint pain   . Migraine   . Obesity   . Pain    Toes on bilateral feet  . Polycystic ovarian disease   . Sleep apnea     Tobacco History: Social History   Tobacco Use  Smoking Status Former Smoker  . Packs/day: 0.30  . Years: 2.00  . Pack years: 0.60  . Types: Cigarettes  . Last attempt to quit: 09/03/1992  . Years since quitting: 26.0  Smokeless Tobacco Never Used   Counseling given: Not Answered   Outpatient Medications Prior to Visit  Medication Sig Dispense Refill  . diazepam (VALIUM) 2 MG tablet TAKE ONE TABLET BY MOUTH EVERY TWELVE HOURS AS NEEDED  2  . doxycycline (VIBRA-TABS) 100 MG tablet Take 100 mg by mouth daily. If needed for problems    . ibuprofen (ADVIL,MOTRIN) 800 MG tablet Take 800 mg by mouth every 8 (eight) hours as needed.    . tamoxifen (NOLVADEX) 20 MG  tablet Take 1 tablet (20 mg total) by mouth daily. 90 tablet 3  . zolmitriptan (ZOMIG-ZMT) 5 MG disintegrating tablet Take 5 mg by mouth as needed.     . benzonatate (TESSALON) 200 MG capsule Take 1 capsule (200 mg total) by mouth 2 (two) times daily as needed for cough. 30 capsule 1  . doxycycline (VIBRA-TABS) 100 MG tablet Patient taking 1 po daily x 2 months (started 9/22)  0  . predniSONE (DELTASONE) 10 MG tablet 4 tabs x 5 days, 3  tabs x 5 days, 2 tabs x 5 days, 1 tab x 5 days then stop 50 tablet 0   No facility-administered medications prior to visit.     Review of Systems  Review of Systems  Constitutional: Negative.   HENT: Negative.   Respiratory: Positive for cough. Negative for chest tightness, shortness of breath and wheezing.   Cardiovascular: Negative.     Physical Exam  BP 116/80 (BP Location: Left Arm, Patient Position: Sitting, Cuff Size: Normal)   Pulse 82   Ht 5\' 3"  (1.6 m)   Wt 256 lb 3.2 oz (116.2 kg)   SpO2 100%   BMI 45.38 kg/m  Physical Exam Constitutional:      General: She is not in acute distress.    Appearance: Normal appearance. She is well-developed. She is not ill-appearing.  HENT:     Head: Normocephalic and atraumatic.     Right Ear: Tympanic membrane normal.     Left Ear: Tympanic membrane normal.     Mouth/Throat:     Mouth: Mucous membranes are moist.     Pharynx: Oropharynx is clear.  Eyes:     Extraocular Movements: Extraocular movements intact.     Pupils: Pupils are equal, round, and reactive to light.  Neck:     Musculoskeletal: Normal range of motion and neck supple.  Cardiovascular:     Rate and Rhythm: Normal rate and regular rhythm.     Heart sounds: Normal heart sounds.  Pulmonary:     Effort: Pulmonary effort is normal. No respiratory distress.     Breath sounds: Normal breath sounds. No wheezing.  Musculoskeletal: Normal range of motion.  Skin:    General: Skin is warm and dry.     Findings: No erythema or rash.  Neurological:     Mental Status: She is alert and oriented to person, place, and time.  Psychiatric:        Mood and Affect: Mood normal.        Behavior: Behavior normal.        Thought Content: Thought content normal.        Judgment: Judgment normal.      Lab Results:  CBC    Component Value Date/Time   WBC 6.6 09/11/2018 0901   RBC 4.50 09/11/2018 0901   HGB 13.2 09/11/2018 0901   HGB 13.7 11/13/2017 0840   HGB 13.3  02/29/2016 1634   HCT 39.4 09/11/2018 0901   HCT 39.6 02/29/2016 1634   PLT 331.0 09/11/2018 0901   PLT 295 11/13/2017 0840   PLT 315 02/29/2016 1634   MCV 87.6 09/11/2018 0901   MCV 87 02/29/2016 1634   MCH 28.9 11/13/2017 0840   MCHC 33.4 09/11/2018 0901   RDW 12.9 09/11/2018 0901   RDW 13.6 02/29/2016 1634   LYMPHSABS 1.2 09/11/2018 0901   MONOABS 0.4 09/11/2018 0901   EOSABS 0.1 09/11/2018 0901   BASOSABS 0.1 09/11/2018 0901    BMET  Component Value Date/Time   NA 140 09/11/2018 0901   NA 140 02/29/2016 1634   K 3.8 09/11/2018 0901   CL 106 09/11/2018 0901   CO2 27 09/11/2018 0901   GLUCOSE 137 (H) 09/11/2018 0901   BUN 9 09/11/2018 0901   BUN 13 02/29/2016 1634   CREATININE 0.82 09/11/2018 0901   CREATININE 0.84 11/13/2017 0840   CALCIUM 8.9 09/11/2018 0901   GFRNONAA >60 11/13/2017 0840   GFRAA >60 11/13/2017 0840    BNP No results found for: BNP  ProBNP No results found for: PROBNP  Imaging: No results found.   Assessment & Plan:   Abnormal chest x-ray - Feels significantly better, occasional dry cough. NO shortness of breath, wheezing or fever.  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

## 2018-10-07 ENCOUNTER — Telehealth: Payer: Self-pay | Admitting: Hematology and Oncology

## 2018-10-07 ENCOUNTER — Encounter: Payer: Self-pay | Admitting: Adult Health

## 2018-10-07 ENCOUNTER — Inpatient Hospital Stay: Attending: Adult Health | Admitting: Adult Health

## 2018-10-07 VITALS — BP 126/88 | HR 84 | Temp 98.8°F | Resp 17 | Ht 63.0 in | Wt 256.6 lb

## 2018-10-07 DIAGNOSIS — Z170 Estrogen receptor positive status [ER+]: Secondary | ICD-10-CM

## 2018-10-07 DIAGNOSIS — C50511 Malignant neoplasm of lower-outer quadrant of right female breast: Secondary | ICD-10-CM | POA: Diagnosis present

## 2018-10-07 DIAGNOSIS — Z79810 Long term (current) use of selective estrogen receptor modulators (SERMs): Secondary | ICD-10-CM | POA: Diagnosis not present

## 2018-10-07 DIAGNOSIS — Z9011 Acquired absence of right breast and nipple: Secondary | ICD-10-CM | POA: Diagnosis not present

## 2018-10-07 DIAGNOSIS — Z923 Personal history of irradiation: Secondary | ICD-10-CM | POA: Diagnosis not present

## 2018-10-07 DIAGNOSIS — C50911 Malignant neoplasm of unspecified site of right female breast: Secondary | ICD-10-CM

## 2018-10-07 NOTE — Telephone Encounter (Signed)
Gave avs and calendar ° °

## 2018-10-07 NOTE — Patient Instructions (Signed)
Letrozole tablets What is this medicine? LETROZOLE (LET roe zole) blocks the production of estrogen. It is used to treat breast cancer. This medicine may be used for other purposes; ask your health care provider or pharmacist if you have questions. COMMON BRAND NAME(S): Femara What should I tell my health care provider before I take this medicine? They need to know if you have any of these conditions: -high cholesterol -liver disease -osteoporosis (weak bones) -an unusual or allergic reaction to letrozole, other medicines, foods, dyes, or preservatives -pregnant or trying to get pregnant -breast-feeding How should I use this medicine? Take this medicine by mouth with a glass of water. You may take it with or without food. Follow the directions on the prescription label. Take your medicine at regular intervals. Do not take your medicine more often than directed. Do not stop taking except on your doctor's advice. Talk to your pediatrician regarding the use of this medicine in children. Special care may be needed. Overdosage: If you think you have taken too much of this medicine contact a poison control center or emergency room at once. NOTE: This medicine is only for you. Do not share this medicine with others. What if I miss a dose? If you miss a dose, take it as soon as you can. If it is almost time for your next dose, take only that dose. Do not take double or extra doses. What may interact with this medicine? Do not take this medicine with any of the following medications: -estrogens, like hormone replacement therapy or birth control pills This medicine may also interact with the following medications: -dietary supplements such as androstenedione or DHEA -prasterone -tamoxifen This list may not describe all possible interactions. Give your health care provider a list of all the medicines, herbs, non-prescription drugs, or dietary supplements you use. Also tell them if you smoke, drink  alcohol, or use illegal drugs. Some items may interact with your medicine. What should I watch for while using this medicine? Tell your doctor or healthcare professional if your symptoms do not start to get better or if they get worse. Do not become pregnant while taking this medicine or for 3 weeks after stopping it. Women should inform their doctor if they wish to become pregnant or think they might be pregnant. There is a potential for serious side effects to an unborn child. Talk to your health care professional or pharmacist for more information. Do not breast-feed while taking this medicine or for 3 weeks after stopping it. This medicine may interfere with the ability to have a child. Talk with your doctor or health care professional if you are concerned about your fertility. Using this medicine for a long time may increase your risk of low bone mass. Talk to your doctor about bone health. You may get drowsy or dizzy. Do not drive, use machinery, or do anything that needs mental alertness until you know how this medicine affects you. Do not stand or sit up quickly, especially if you are an older patient. This reduces the risk of dizzy or fainting spells. You may need blood work done while you are taking this medicine. What side effects may I notice from receiving this medicine? Side effects that you should report to your doctor or health care professional as soon as possible: -allergic reactions like skin rash, itching, or hives -bone fracture -chest pain -signs and symptoms of a blood clot such as breathing problems; changes in vision; chest pain; severe, sudden headache; pain, swelling,   warmth in the leg; trouble speaking; sudden numbness or weakness of the face, arm or leg -vaginal bleeding Side effects that usually do not require medical attention (report to your doctor or health care professional if they continue or are bothersome): -bone, back, joint, or muscle  pain -dizziness -fatigue -fluid retention -headache -hot flashes, night sweats -nausea -weight gain This list may not describe all possible side effects. Call your doctor for medical advice about side effects. You may report side effects to FDA at 1-800-FDA-1088. Where should I keep my medicine? Keep out of the reach of children. Store between 15 and 30 degrees C (59 and 86 degrees F). Throw away any unused medicine after the expiration date. NOTE: This sheet is a summary. It may not cover all possible information. If you have questions about this medicine, talk to your doctor, pharmacist, or health care provider.  2019 Elsevier/Gold Standard (2016-03-26 11:10:41) Goserelin injection What is this medicine? GOSERELIN (GOE se rel in) is similar to a hormone found in the body. It lowers the amount of sex hormones that the body makes. Men will have lower testosterone levels and women will have lower estrogen levels while taking this medicine. In men, this medicine is used to treat prostate cancer; the injection is either given once per month or once every 12 weeks. A once per month injection (only) is used to treat women with endometriosis, dysfunctional uterine bleeding, or advanced breast cancer. This medicine may be used for other purposes; ask your health care provider or pharmacist if you have questions. COMMON BRAND NAME(S): Zoladex What should I tell my health care provider before I take this medicine? They need to know if you have any of these conditions (some only apply to women): -diabetes -heart disease or previous heart attack -high blood pressure -high cholesterol -kidney disease -osteoporosis or low bone density -problems passing urine -spinal cord injury -stroke -tobacco smoker -an unusual or allergic reaction to goserelin, hormone therapy, other medicines, foods, dyes, or preservatives -pregnant or trying to get pregnant -breast-feeding How should I use this  medicine? This medicine is for injection under the skin. It is given by a health care professional in a hospital or clinic setting. Men receive this injection once every 4 weeks or once every 12 weeks. Women will only receive the once every 4 weeks injection. Talk to your pediatrician regarding the use of this medicine in children. Special care may be needed. Overdosage: If you think you have taken too much of this medicine contact a poison control center or emergency room at once. NOTE: This medicine is only for you. Do not share this medicine with others. What if I miss a dose? It is important not to miss your dose. Call your doctor or health care professional if you are unable to keep an appointment. What may interact with this medicine? -female hormones like estrogen -herbal or dietary supplements like black cohosh, chasteberry, or DHEA -female hormones like testosterone -prasterone This list may not describe all possible interactions. Give your health care provider a list of all the medicines, herbs, non-prescription drugs, or dietary supplements you use. Also tell them if you smoke, drink alcohol, or use illegal drugs. Some items may interact with your medicine. What should I watch for while using this medicine? Visit your doctor or health care professional for regular checks on your progress. Your symptoms may appear to get worse during the first weeks of this therapy. Tell your doctor or healthcare professional if your symptoms do  not start to get better or if they get worse after this time. Your bones may get weaker if you take this medicine for a long time. If you smoke or frequently drink alcohol you may increase your risk of bone loss. A family history of osteoporosis, chronic use of drugs for seizures (convulsions), or corticosteroids can also increase your risk of bone loss. Talk to your doctor about how to keep your bones strong. This medicine should stop regular monthly menstration in  women. Tell your doctor if you continue to [facility]. Women should not become pregnant while taking this medicine or for 12 weeks after stopping this medicine. Women should inform their doctor if they wish to become pregnant or think they might be pregnant. There is a potential for serious side effects to an unborn child. Talk to your health care professional or pharmacist for more information. Do not breast-feed an infant while taking this medicine. Men should inform their doctors if they wish to father a child. This medicine may lower sperm counts. Talk to your health care professional or pharmacist for more information. What side effects may I notice from receiving this medicine? Side effects that you should report to your doctor or health care professional as soon as possible: -allergic reactions like skin rash, itching or hives, swelling of the face, lips, or tongue -bone pain -breathing problems -changes in vision -chest pain -feeling faint or lightheaded, falls -fever, chills -pain, swelling, warmth in the leg -pain, tingling, numbness in the hands or feet -signs and symptoms of low blood pressure like dizziness; feeling faint or lightheaded, falls; unusually weak or tired -stomach pain -swelling of the ankles, feet, hands -trouble passing urine or change in the amount of urine -unusually high or low blood pressure -unusually weak or tired Side effects that usually do not require medical attention (report to your doctor or health care professional if they continue or are bothersome): -change in sex drive or performance -changes in breast size in both males and females -changes in emotions or moods -headache -hot flashes -irritation at site where injected -loss of appetite -skin problems like acne, dry skin -vaginal dryness This list may not describe all possible side effects. Call your doctor for medical advice about side effects. You may report side effects to FDA at  1-800-FDA-1088. Where should I keep my medicine? This drug is given in a hospital or clinic and will not be stored at home. NOTE: This sheet is a summary. It may not cover all possible information. If you have questions about this medicine, talk to your doctor, pharmacist, or health care provider.  2019 Elsevier/Gold Standard (2013-10-27 11:10:35)

## 2018-10-07 NOTE — Assessment & Plan Note (Addendum)
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  She is tolerating tamoxifen moderately well and will continue it.  She will fax me the results of her mammogram.  I would like to review it personally and see her breast density category.  She will continue with annual mammograms.  We discussed whether or not the rash and hydradenitis issues could be related to the Tamoxifen.  We discussed trialing off of the tamoxifen to see.  We reviewed that switching would involve Zoladex every 4 weeks and Letrozole.  I gave her info on these today in her AVS to read about.  She really doesn't think that these issues warrant changing at this point.  She is going to consider it and will let us know if anything worsens.  I recommended she avoid the skinny detox tea since upon my review some of the ingredients are metabolized through the CYP2D6 pathway.    I placed a referral to PT at her request.  She will return in 1 year for f/u with Dr. Lindi Adie.  She will see her surgeon in 6 months.

## 2018-10-08 ENCOUNTER — Telehealth: Payer: Self-pay | Admitting: Pulmonary Disease

## 2018-10-08 ENCOUNTER — Telehealth: Payer: Self-pay | Admitting: Primary Care

## 2018-10-08 DIAGNOSIS — R9389 Abnormal findings on diagnostic imaging of other specified body structures: Secondary | ICD-10-CM

## 2018-10-08 DIAGNOSIS — R059 Cough, unspecified: Secondary | ICD-10-CM

## 2018-10-08 DIAGNOSIS — R05 Cough: Secondary | ICD-10-CM

## 2018-10-08 NOTE — Telephone Encounter (Signed)
-----   Message from Marshell Garfinkel, MD sent at 10/08/2018  9:28 AM EST ----- Regarding: RE: CXR follow-up, possible bronch? Old chest x xray and CT are in PACS Follow up CXR looks better and is expected to have some resolving opacities. If she is feeling better then no need for bronch  Get follow up CT in 2 months and clinic follow up. ----- Message ----- From: Martyn Ehrich, NP Sent: 10/08/2018   9:09 AM EST To: Marshell Garfinkel, MD Subject: CXR follow-up, possible bronch?                Hey Dr. Vaughan Browner,  I saw this patient of yours for a follow-up pneumonia on 1/30. Feels a lot better, still has slight dry cough. Completed prednisone taper. 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 States that she removed her humidifier that had pink/black mold on the filter. CXR results pending. Per consult note, if abnormalities persist discussed potential need for bronchoscopy. She would be ok pursuing this. She is not anticoagulated. Denies shortness of breath or fever.    Repeat CXR on 1/30 showed Persistent but slightly improved right upper lobe infiltrate. The right middle lobe infiltrates seen on the prior exam is not visualized on today's study.  Can you take a look at her chart/image. I do not have the original xray but your note shows you saw it. Let me know your thoughts. Thanks  - [employer]

## 2018-10-08 NOTE — Telephone Encounter (Signed)
Called and spoke with Patient.  She is requesting PCP  Nicanor Bake, receive fax of CXR results. CXR results and recommendations from Geraldo Pitter, NP and Dr Vaughan Browner given.  Understanding given.  CT chest w/out contrast ordered for 2 months. CXR faxed to [facility] [phone]. Confirmation received. Nothing further at this time.

## 2018-10-08 NOTE — Telephone Encounter (Signed)
Please let patient know CXR showed persistent but slightly improvement in right upper lobe infiltrate. Right mild lobe infiltrates seen on prior not visualized. Dr. Vaughan Browner looked at it as well. No need for bronchoscopy. Needs follow up CT chest without contrast and OV in 2 months with Dr. Vaughan Browner please. Thank you.

## 2018-10-08 NOTE — Telephone Encounter (Signed)
Called patient, unable to reach left message to give [place] a call back. Patient was last seen by [facility] on 10/02/2018 with AVS instructions below.   Feels significantly better, occasional dry cough. NO shortness of breath, wheezing or fever.  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    Patient Instructions by Martyn Ehrich, NP at 10/02/2018 9:00 AM  Author: Martyn Ehrich, NP Author Type: Nurse Practitioner Filed: 10/02/2018 9:31 AM  Note Status: Signed Cosign: Cosign Not Required Encounter Date: 10/02/2018  Editor: Martyn Ehrich, NP (Nurse Practitioner)    Will call with CXR results later today or tomorrow  If abnormality persists we will discuss possible bronchoscopy     ________________________________________________________________________________________  cxr was ordered under Dr. Vaughan Browner. Patient is requesting results. Beth do you mind resulting them? Please advise. Patient also wants to have them sent to her PCP per message.

## 2018-10-08 NOTE — Telephone Encounter (Signed)
See telephone note.

## 2018-10-08 NOTE — Telephone Encounter (Signed)
Noted will close encounter.

## 2018-10-08 NOTE — Telephone Encounter (Signed)
Spoke with pt and relayed Dr Matilde Bash recommendation of a 2 month chest CT.  Pt agreed but wanted it done at [facility] in [place].  Order placed.  Nothing further needed.

## 2018-10-20 ENCOUNTER — Ambulatory Visit: Admitting: Rehabilitation

## 2018-11-03 ENCOUNTER — Telehealth: Payer: Self-pay | Admitting: Pulmonary Disease

## 2018-11-03 NOTE — Telephone Encounter (Signed)
Called and spoke with Patient.  She stated that she wanted upcoming Ct chest to be scheduled at [facility], because that is where the original CT chest was completed.Patient is scheduled 12/01/18, at [facility]  Message routed to [facility] for assistance

## 2018-11-03 NOTE — Telephone Encounter (Signed)
Resched for [facility] for 3/30 @ 8:30, chec in by 8:00.  No prep.  Called & gave pt appt info.  Pt verbalized understanding & stated nothing further needed at this time.

## 2018-11-14 ENCOUNTER — Telehealth: Payer: Self-pay | Admitting: Pulmonary Disease

## 2018-11-14 NOTE — Telephone Encounter (Signed)
Reviewed CT scan done yesterday at [facility]. I called pt to discuss results. She will likely need a bronch.  I got voice mail and left a message requesting call back.  Marshell Garfinkel MD [facility] 11/14/2018, 9:35 AM

## 2018-11-14 NOTE — Telephone Encounter (Signed)
I called and spoke with the patient. She reports recurrence of non productive cough over the past week. No cold symptoms, fevers, chills, travel or sick contacts. CT reviewed with improvement in right upper middle lobe infiltrates. However there is new RLL infiltrate compared to dec 2019  I recommended a bronchoscope but pt did not want to go ahead with procedure. We have decided to retry prednisone as it helped last time with symptoms. I called in prednisone 60 mg/day to be held at that dose.  Please make follow up appointment in 2 weeks in clinic.  Marshell Garfinkel MD [facility] 11/14/2018, 4:34 PM

## 2018-11-14 NOTE — Telephone Encounter (Signed)
Called and spoke with pt to see if I could get her scheduled for a follow up appt per Dr. Vaughan Browner. Pt expressed understanding.  appt has been scheduled for pt with Dr. Vaughan Browner 3/31 at 9:00am. Nothing further needed.

## 2018-11-14 NOTE — Telephone Encounter (Signed)
Note from previous phone encounter from Dr. Vaughan Browner:  Reviewed CT scan done yesterday at [facility]. I called pt to discuss results. She will likely need a bronch.  I got voice mail and left a message requesting call back.  Marshell Garfinkel MD [facility] 11/14/2018, 9:35 AM  Called and spoke with pt in regards to Dr. Vaughan Browner trying to call her. Asked if there was a specific time that worked better for her for Dr. Vaughan Browner to try to call her and pt stated she would be at lunch betweek 12-1, stated that she worked at a [employer] office so if Dr. Vaughan Browner was not able to call her between that timeframe, she would try to step out of a room if she was in one.  Pt can be reached at [phone].  Routing to Dr. Vaughan Browner.

## 2018-11-24 ENCOUNTER — Ambulatory Visit: Admitting: Rehabilitation

## 2018-11-26 ENCOUNTER — Telehealth: Payer: Self-pay | Admitting: Pulmonary Disease

## 2018-11-26 NOTE — Telephone Encounter (Signed)
Spoke with pt to reschedule 12/02/2018 appointment per COVID-19 protocol. Pt states she was to see Dr. Vaughan Browner for a CT chest follow-up for pneumonia. Pt states she is feeling better, but would like to hear from Dr. Vaughan Browner if she still needs to be seen.  Dr. Vaughan Browner please advise.

## 2018-11-27 NOTE — Telephone Encounter (Signed)
Called and spoke with patient regarding her app on 12/02/2018. According to notes from 11/14/2018, Dr. Vaughan Browner requested 2 week ROV for f/u on her symptoms after prednisone therapy. Pt states she is feeling better with no new or worsening symptoms. I informed her that Dr. Vaughan Browner will not be in office on her scheduled app 12/02/2018 and offered her a televisit with one of our nurse practitioners. She states she is feeling better and agreed to giving Erica Higgins a call should any new or worsening symptoms arise. Recall has been placed for July 2020 per COVID-19 protocol. Nothing further needed at this time.

## 2018-12-01 ENCOUNTER — Inpatient Hospital Stay: Admission: RE | Admit: 2018-12-01 | Source: Ambulatory Visit

## 2018-12-01 NOTE — Progress Notes (Signed)
Virtual Visit via Telephone Note  I connected with Erica Higgins on 12/01/18 at  4:00 PM EDT by telephone and verified that I am speaking with the correct person using two identifiers.   I discussed the limitations, risks, security and privacy concerns of performing an evaluation and management service by telephone and the availability of in person appointments. I also discussed with the patient that there may be a patient responsible charge related to this service. The patient expressed understanding and agreed to proceed.  History of Present Illness: 48 year old female, former smoker. PMH significant for allergic rhinitis, cough, breast cancer, obesity. Patient of Dr. Vaughan Higgins, seen for initial consult on 09/11/18. Images show consolidative changes suggestive of pneumonia and not of malignancy. Other considerations include noninfectious inflammatory process such as connective tissue disease. She does have history of radiation exposure for right side breast cancer, however, timeline does not suggest radiation pneumonitis. Given prolonged prednisone taper. Plan close follow up with chest xray later this month. If persistent abnormalities then bronchoscopy may be considered.    Previous Financial controller Encounters: 48 year old with past medical history of breast cancer status post mastectomy, chronic headaches, sleep apnea, allergies Diagnosed with pneumonia in early December with an abnormal chest x-ray.  She was given a course of Levaquin for 7 days and prednisone dosepak.  She continued to have persistent cough.  She was reevaluated at the end of December with a chest x-ray and CT scan which showed persistent right upper and middle lobe consolidation.  She was given azithromycin for 8 days. Continues to have nonproductive cough, no fevers, chills.  This is associated with dyspnea.  No complaints of wheeze. History noted for breast cancer status post mastectomy.  She also got radiation which was completed in July  2019  10/02/2018 Patient presents today for 3 week follow-up with CXR prior. Feels a lot better, still has slight dry cough. Completed prednisone taper. 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 States that she removed her humidifier that had pink/black mold on the filter. CXR results pending. If abnormalities persist discussed potential need for bronchoscopy. She would be ok pursuing this. She is not anticoagulated. Denies shortness of breath or fever.  CXR showed persistent but slightly improved in right upper lobe infiltrate. Right middle lobe infiltrates seen on prior not visualized. Discussed with Dr. Vaughan Higgins, bronchoscopy not recommended at this time. Needs Follow up CT Chest without contrast in 2 months (wants done at [facility] in [place]).   Chest CT completed at [facility] on 11/13/18 which showed improvement in right upper/middle lobe infiltrates, however, these is new RLL infiltrate compared to Dec 2019. Dr. Vaughan Higgins recommended bronchoscopy but pateint did not want to go ahead with procedure. Decided to try prednisone as it helped her symptoms last time. Given prednisone 60mg  to be held at that dose until follow-up.   12/02/2018 Patient called today for 2 week follow-up E-visit, rescheduled from Dr. Matilde Higgins scheduled d/t current COVID-19 pandemic. Patient feels well. States that her cough has gone away. Experiences some shortness of breath with long distances. Able to to do grocery shopping. Continues taking prednisone 60mg  daily. Per Dr. Vaughan Higgins, plan slow taper by 10mg  every 2-4 weeks since she relapsed with a faster taper in Jan. Repeat CT in 3 months already ordered.  Observations/Objective: - No shortness of breath, wheezing or cough  Assessment and Plan:  RLL pulmonary infiltrate - Doing better, cough has resolved - Continues prednisone 60mg  daily, plan taper by 10mg  every 2-4 weeks  until (should be off by mid-June) - Needs repeat CT in 3 months (Mid-June) -  Patient requesting note/AVS be sent to primary care Erica Higgins   Follow Up Instructions:  - Follow up in 3 months after Chest CT with Dr. Vaughan Higgins d/t pulmonary infiltrates    I discussed the assessment and treatment plan with the patient. The patient was provided an opportunity to ask questions and all were answered. The patient agreed with the plan and demonstrated an understanding of the instructions.   The patient was advised to call back or seek an in-person evaluation if the symptoms worsen or if the condition fails to improve as anticipated.  I provided 20 minutes of non-face-to-face time during this encounter.   Erica Ehrich, NP

## 2018-12-01 NOTE — Telephone Encounter (Signed)
Please do tele visit. She has already seen Derl Barrow NP, before so can arrange with her.  Is she still on prednisone 60 mg? That was started for abnormal CT that was suggestive of COP? My plan was for to start a slow prednisone taper by 10 mg every 2-4 weeks since she relapsed with a faster taper in january, Repeat CT in 3 months.  Marshell Garfinkel MD [facility] 12/01/2018, 8:56 AM

## 2018-12-01 NOTE — Telephone Encounter (Signed)
LVMTCB x 1 for patient. Requested she call back with a day and time that will work for [employer].

## 2018-12-01 NOTE — Telephone Encounter (Signed)
Unable to reach the patient on contact #. No vmail pick up. Will try again later.

## 2018-12-02 ENCOUNTER — Encounter: Payer: Self-pay | Admitting: Primary Care

## 2018-12-02 ENCOUNTER — Other Ambulatory Visit: Payer: Self-pay

## 2018-12-02 ENCOUNTER — Ambulatory Visit: Admitting: Pulmonary Disease

## 2018-12-02 ENCOUNTER — Ambulatory Visit (INDEPENDENT_AMBULATORY_CARE_PROVIDER_SITE_OTHER): Admitting: Primary Care

## 2018-12-02 DIAGNOSIS — R918 Other nonspecific abnormal finding of lung field: Secondary | ICD-10-CM

## 2018-12-02 NOTE — Assessment & Plan Note (Addendum)
-   Doing better, cough has resolved - Continues prednisone 60mg  daily, plan taper by 10mg  every 2-4 weeks until (should be off by mid-June) - Needs repeat CT in 3 months (Mid-June)

## 2018-12-02 NOTE — Patient Instructions (Addendum)
Prednisone taper: 50mg  (4/1-4/14) 40mg  (4/15-4/28) 30mg  (4/29- 5/12) 20mg  (5/13-5/27) 10mg  (5/28-6/10)  CT chest in 3 months ( mid-June)  Follow up with Dr. Vaughan Browner after CT scan

## 2018-12-26 ENCOUNTER — Telehealth: Payer: Self-pay | Admitting: Pulmonary Disease

## 2018-12-26 NOTE — Telephone Encounter (Signed)
ATC Patient.  Left message to call back. 

## 2018-12-29 NOTE — Telephone Encounter (Signed)
LMTCB x2  

## 2018-12-30 ENCOUNTER — Telehealth: Payer: Self-pay | Admitting: Pulmonary Disease

## 2018-12-30 MED ORDER — PREDNISONE 10 MG PO TABS: ORAL_TABLET | ORAL | 0 refills | Status: DC

## 2018-12-30 NOTE — Telephone Encounter (Signed)
Called and spoke with Patient.  Patient stated Eustaquio Maize, NP did not send her Prednisone prescription to pharmacy.  Patient stated she had 90 tabs of prednisone.  Patient requested prescription to be sent to [facility].  Prednisone 10mg  taper #120 sent to requested pharmacy.  Nothing further at this time.  12/02/18 OV with EW- Prednisone taper: 50mg  (4/1-4/14) 40mg  (4/15-4/28) 30mg  (4/29- 5/12) 20mg  (5/13-5/27) 10mg  (5/28-6/10)

## 2018-12-30 NOTE — Telephone Encounter (Signed)
Called and spoke with [facility].  She requested clarification for prednisone refill.  Explained patient had #90 10mg  prednisone to start with, and needed prednisone to finish out instructions from [place], NP. Understanding stated.  Nothing further at this time.   Beth, NP instructions -  12/02/18 OV with EW- Prednisone taper: 50mg  (4/1-4/14) 40mg  (4/15-4/28) 30mg  (4/29- 5/12) 20mg  (5/13-5/27) 10mg  (5/28-6/10)

## 2018-12-30 NOTE — Telephone Encounter (Signed)
Pt is calling back [phone] Ext#[phone]

## 2019-04-24 ENCOUNTER — Other Ambulatory Visit: Payer: Self-pay | Admitting: Hematology and Oncology

## 2019-05-06 ENCOUNTER — Telehealth: Payer: Self-pay | Admitting: Pulmonary Disease

## 2019-05-06 NOTE — Telephone Encounter (Signed)
Found CT report in PACs from 03/30/19. Attempted to print the report but it would not print.   Left message for patient to let her know we have access to the images and report.

## 2019-05-07 ENCOUNTER — Ambulatory Visit (INDEPENDENT_AMBULATORY_CARE_PROVIDER_SITE_OTHER): Admitting: Pulmonary Disease

## 2019-05-07 ENCOUNTER — Ambulatory Visit (INDEPENDENT_AMBULATORY_CARE_PROVIDER_SITE_OTHER)

## 2019-05-07 ENCOUNTER — Encounter: Payer: Self-pay | Admitting: Pulmonary Disease

## 2019-05-07 ENCOUNTER — Other Ambulatory Visit: Payer: Self-pay

## 2019-05-07 VITALS — BP 132/68 | HR 74 | Temp 97.3°F | Ht 63.0 in | Wt 250.6 lb

## 2019-05-07 DIAGNOSIS — R059 Cough, unspecified: Secondary | ICD-10-CM

## 2019-05-07 DIAGNOSIS — R05 Cough: Secondary | ICD-10-CM

## 2019-05-07 NOTE — Patient Instructions (Signed)
I am sorry you are not feeling well.  Finish the prednisone and ciprofloxacin as prescribed by her primary care doctor We will get a chest x-ray today for reevaluation of the lungs Follow-up in 3 months.
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
## 2019-05-08 NOTE — Telephone Encounter (Signed)
Spoke with pt. Dr. Vaughan Browner already reviewed this CT as her appointment with him was on 05/07/2019. Nothing further was needed.

## 2019-06-01 ENCOUNTER — Telehealth: Payer: Self-pay | Admitting: Hematology and Oncology

## 2019-06-01 ENCOUNTER — Telehealth: Payer: Self-pay

## 2019-06-01 NOTE — Telephone Encounter (Signed)
Pt reporting severe joint pain, that has increased with discomfort over past several months.  Pt reports now becoming more difficult to get out bed in mornings.  Pt denies any SHOB, or fever.    Per MD recommendations patient to hold Tamoxifen for 2 weeks.  Pt will call clinic in 2 weeks to update if symptoms have resolved.  At that time we will review with MD for change in medication vs Tamoxifen every other day.   RN notified patient, patient voiced understanding and agreement.

## 2019-06-01 NOTE — Telephone Encounter (Signed)
RN returned call, voicemail left for return call.  

## 2019-06-01 NOTE — Telephone Encounter (Signed)
Returned patient's phone call regarding medication questions, transferred patient to speak with providers nurse.

## 2019-06-16 ENCOUNTER — Telehealth: Payer: Self-pay | Admitting: *Deleted

## 2019-06-16 NOTE — Telephone Encounter (Signed)
Pt states she has stopped taking tamoxifen x2 weeks and all symptoms of joint pain have improved 100%.  Pt requesting apt with Dr. Lindi Adie to discuss other anti-estrogen options.  Pt states in February Dr. Lindi Adie mentioned Zoladex injection and Letrozole but is unsure if she wants to do that and prefers to sit down and talk about options again.  Apt made and pt aware of date and time.

## 2019-06-16 NOTE — Telephone Encounter (Signed)
Received after hours message from pt requesting to schedule an apt with Dr. Lindi Adie.  Attempt x1 to call pt, no answer, LVM to return call.

## 2019-06-19 ENCOUNTER — Inpatient Hospital Stay: Attending: Hematology and Oncology | Admitting: Hematology and Oncology

## 2019-06-19 ENCOUNTER — Other Ambulatory Visit: Payer: Self-pay

## 2019-06-19 DIAGNOSIS — C50511 Malignant neoplasm of lower-outer quadrant of right female breast: Secondary | ICD-10-CM | POA: Insufficient documentation

## 2019-06-19 DIAGNOSIS — Z79810 Long term (current) use of selective estrogen receptor modulators (SERMs): Secondary | ICD-10-CM | POA: Insufficient documentation

## 2019-06-19 DIAGNOSIS — Z170 Estrogen receptor positive status [ER+]: Secondary | ICD-10-CM | POA: Diagnosis not present

## 2019-06-19 NOTE — Progress Notes (Signed)
Patient Care Team: Berenice Primas as PCP - General (Nurse Practitioner) Cristal Deer, DPM as Attending Physician (Podiatry) Nicholas Lose, MD as Consulting Physician (Hematology and Oncology) Erroll Luna, MD as Consulting Physician (General Surgery) Audry Pili, MD as Referring Physician (Radiation Oncology) Gardenia Phlegm, NP as Nurse Practitioner (Hematology and Oncology)  DIAGNOSIS:    ICD-10-CM   1. Malignant neoplasm of lower-outer quadrant of right breast of female, estrogen receptor positive ([place])  C50.511    Z17.0     SUMMARY OF ONCOLOGIC HISTORY: Oncology History  Malignant neoplasm of lower-outer quadrant of right breast of female, estrogen receptor positive ([name])  11/06/2017 Initial Diagnosis   Palpable right breast mass which on radiologic evaluation was epidermoid cyst; incidentally found additional right breast mass at 8 o'clock position: 1.6 cm: IDC grade 2, ER 90%, PR 100%, Ki-67 2%, HER-2 negative, T1CN0 stage I a AJCC 8   11/25/2017 Genetic Testing   The Common Hereditary Cancer Panel offered by [facility] includes sequencing and/or deletion duplication testing of the following 47 genes: APC, ATM, AXIN2, BARD1, BMPR1A, BRCA1, BRCA2, BRIP1, CDH1, CDKN2A (p14ARF), CDKN2A (p16INK4a), CKD4, CHEK2, CTNNA1, DICER1, EPCAM (Deletion/duplication testing only), GREM1 (promoter region deletion/duplication testing only), KIT, MEN1, MLH1, MSH2, MSH3, MSH6, MUTYH, NBN, NF1, NHTL1, PALB2, PDGFRA, PMS2, POLD1, POLE, PTEN, RAD50, RAD51C, RAD51D, SDHB, SDHC, SDHD, SMAD4, SMARCA4. STK11, TP53, TSC1, TSC2, and VHL.  The following genes were evaluated for sequence changes only: SDHA and HOXB13 c.251G>A variant only.  Results: Negative, no pathogenic variants identified.  The date of this test report is 11/25/2017.    12/05/2017 Surgery   Right mastectomy: Invasive ductal carcinoma 1.1 cm with lymphovascular invasion, 1/5 lymph nodes positive, grade 2, ER 90%, PR 100%,  Ki-67 2%, HER-2 negative T1bN1 Stage 1B.  Mammaprint low risk   01/31/2018 - 03/14/2018 Radiation Therapy   Adjuvant XRT at [facility]   04/2018 -  Anti-estrogen oral therapy   Tamoxifen daily   06/25/2018 Cancer Staging   Staging form: Breast, AJCC 8th Edition - Pathologic: Stage IA (pT1b, pN1, cM0, G2, ER+, PR+, HER2-) - Signed by Gardenia Phlegm, NP on 06/25/2018     CHIEF COMPLIANT: Follow-up of right breast cancer on tamoxifen  INTERVAL HISTORY: Erica Higgins is a 48 y.o. with above-mentioned history of right breast cancer treated with mastectomy, radiation, and who is currently on tamoxifen. Mammogram on 09/19/18 showed no evidence of malignancy in the left breast. She presents to the clinic today for annual follow-up.   REVIEW OF SYSTEMS:   Constitutional: Denies fevers, chills or abnormal weight loss Eyes: Denies blurriness of vision Ears, nose, mouth, throat, and face: Denies mucositis or sore throat Respiratory: Denies cough, dyspnea or wheezes Cardiovascular: Denies palpitation, chest discomfort Gastrointestinal: Denies nausea, heartburn or change in bowel habits Skin: Denies abnormal skin rashes Lymphatics: Denies new lymphadenopathy or easy bruising Neurological: Denies numbness, tingling or new weaknesses Behavioral/Psych: Mood is stable, no new changes  Extremities: No lower extremity edema Breast: denies any pain or lumps or nodules in either breasts All other systems were reviewed with the patient and are negative.  I have reviewed the past medical history, past surgical history, social history and family history with the patient and they are unchanged from previous note.  ALLERGIES:  is allergic to cleocin [clindamycin hcl]; ivp dye [iodinated diagnostic agents]; latex; penicillins; shellfish allergy; and adhesive [tape].  MEDICATIONS:  Current Outpatient Medications  Medication Sig Dispense Refill  . ciprofloxacin (CIPRO) 500 MG tablet Take  500 mg by mouth 2  (two) times daily.    Marland Kitchen ibuprofen (ADVIL,MOTRIN) 800 MG tablet Take 800 mg by mouth every 8 (eight) hours as needed.    . ondansetron (ZOFRAN) 4 MG tablet Take 4 mg by mouth every 8 (eight) hours as needed for nausea or vomiting.    . predniSONE (DELTASONE) 10 MG tablet Take 10 mg by mouth 2 (two) times daily with a meal.    . tamoxifen (NOLVADEX) 20 MG tablet TAKE 1 TABLET BY MOUTH DAILY 90 tablet 3  . zolmitriptan (ZOMIG-ZMT) 5 MG disintegrating tablet Take 5 mg by mouth as needed.      No current facility-administered medications for this visit.     PHYSICAL EXAMINATION: ECOG PERFORMANCE STATUS: 1 - Symptomatic but completely ambulatory  Vitals:   06/19/19 0810  BP: 115/69  Pulse: 73  Resp: 18  Temp: 98.5 F (36.9 C)  SpO2: 100%   Filed Weights   06/19/19 0810  Weight: 253 lb 4.8 oz (114.9 kg)    GENERAL: alert, no distress and comfortable SKIN: skin color, texture, turgor are normal, no rashes or significant lesions EYES: normal, Conjunctiva are pink and non-injected, sclera clear OROPHARYNX: no exudate, no erythema and lips, buccal mucosa, and tongue normal  NECK: supple, thyroid normal size, non-tender, without nodularity LYMPH: no palpable lymphadenopathy in the cervical, axillary or inguinal LUNGS: clear to auscultation and percussion with normal breathing effort HEART: regular rate & rhythm and no murmurs and no lower extremity edema ABDOMEN: abdomen soft, non-tender and normal bowel sounds MUSCULOSKELETAL: no cyanosis of digits and no clubbing  NEURO: alert & oriented x 3 with fluent speech, no focal motor/sensory deficits EXTREMITIES: No lower extremity edema BREAST: No palpable masses or nodules in either right or left breasts. No palpable axillary supraclavicular or infraclavicular adenopathy no breast tenderness or nipple discharge. (exam performed in the presence of a chaperone)  LABORATORY DATA:  I have reviewed the data as listed CMP Latest Ref Rng & Units  09/11/2018 11/13/2017 02/29/2016  Glucose 70 - 99 mg/dL 137(H) 123 102(H)  BUN 6 - 23 mg/dL 9 12 13   Creatinine 0.40 - 1.20 mg/dL 0.82 0.84 0.74  Sodium 135 - 145 mEq/L 140 139 140  Potassium 3.5 - 5.1 mEq/L 3.8 3.8 3.8  Chloride 96 - 112 mEq/L 106 104 98  CO2 19 - 32 mEq/L 27 29 24   Calcium 8.4 - 10.5 mg/dL 8.9 9.4 9.1  Total Protein 6.0 - 8.3 g/dL 6.9 7.4 7.2  Total Bilirubin 0.2 - 1.2 mg/dL 0.4 0.5 <0.2  Alkaline Phos 39 - 117 U/L 69 87 94  AST 0 - 37 U/L 22 16 18   ALT 0 - 35 U/L 27 22 17     Lab Results  Component Value Date   WBC 6.6 09/11/2018   HGB 13.2 09/11/2018   HCT 39.4 09/11/2018   MCV 87.6 09/11/2018   PLT 331.0 09/11/2018   NEUTROABS 4.9 09/11/2018    ASSESSMENT & PLAN:  Malignant neoplasm of lower-outer quadrant of right breast of female, estrogen receptor positive ([place]) 12/05/17:Right mastectomy: Invasive ductal carcinoma 1.1 cm with lymphovascular invasion, 1/5 lymph nodes positive, grade 2, ER 90%, PR 100%, Ki-67 2%, HER-2 negative T1bN1 Stage 1B Mammaprint 12/21/2017: Luminal type A low risk Adjuvant radiation therapy completed  Plan: With tamoxifen 20 mg daily anti estrogen therapy x10 years started August 2019 Tamoxifen toxicities: Severe joint pains and stiffness which got better after she stopped tamoxifen. Plan: 1.  Reduce dose of  tamoxifen to 10 mg daily 2.  Turmeric supplement 3.  Tonic water at bedtime for the aches and pains  We briefly discussed the role of Zoladex with anastrozole as another option.  I believe that Zoladex with anastrozole could cause more joint aches and pains.  Patient lost 20 pounds by cutting down her food and exercising. I will see her back in February for follow-up and I instructed her to call us if her symptoms continue to bother her.  No orders of the defined types were placed in this encounter.  The patient has a good understanding of the overall plan. she agrees with it. she will call with any problems that may develop  before the next visit here.  Nicholas Lose, MD 06/19/2019  Julious Oka Dorshimer am acting as scribe for Dr. Nicholas Lose.  I have reviewed the above documentation for accuracy and completeness, and I agree with the above.

## 2019-06-19 NOTE — Assessment & Plan Note (Signed)
12/05/17:Right mastectomy: Invasive ductal carcinoma 1.1 cm with lymphovascular invasion, 1/5 lymph nodes positive, grade 2, ER 90%, PR 100%, Ki-67 2%, HER-2 negative T1bN1 Stage 1B Mammaprint 12/21/2017: Luminal type A low risk Adjuvant radiation therapy completed  Plan: With tamoxifen 20 mg daily anti estrogen therapy x10 years

## 2019-06-29 ENCOUNTER — Ambulatory Visit: Admitting: Hematology and Oncology

## 2019-10-07 NOTE — Progress Notes (Signed)
Patient Care Team: Berenice Primas as PCP - General (Nurse Practitioner) Cristal Deer, DPM as Attending Physician (Podiatry) Nicholas Lose, MD as Consulting Physician (Hematology and Oncology) Erroll Luna, MD as Consulting Physician (General Surgery) Audry Pili, MD as Referring Physician (Radiation Oncology) Gardenia Phlegm, NP as Nurse Practitioner (Hematology and Oncology)  DIAGNOSIS:    ICD-10-CM   1. Malignant neoplasm of lower-outer quadrant of right breast of female, estrogen receptor positive ([place])  C50.511    Z17.0     SUMMARY OF ONCOLOGIC HISTORY: Oncology History  Malignant neoplasm of lower-outer quadrant of right breast of female, estrogen receptor positive ([place])  11/06/2017 Initial Diagnosis   Palpable right breast mass which on radiologic evaluation was epidermoid cyst; incidentally found additional right breast mass at 8 o'clock position: 1.6 cm: IDC grade 2, ER 90%, PR 100%, Ki-67 2%, HER-2 negative, T1CN0 stage I a AJCC 8   11/25/2017 Genetic Testing   The Common Hereditary Cancer Panel offered by [facility] includes sequencing and/or deletion duplication testing of the following 47 genes: APC, ATM, AXIN2, BARD1, BMPR1A, BRCA1, BRCA2, BRIP1, CDH1, CDKN2A (p14ARF), CDKN2A (p16INK4a), CKD4, CHEK2, CTNNA1, DICER1, EPCAM (Deletion/duplication testing only), GREM1 (promoter region deletion/duplication testing only), KIT, MEN1, MLH1, MSH2, MSH3, MSH6, MUTYH, NBN, NF1, NHTL1, PALB2, PDGFRA, PMS2, POLD1, POLE, PTEN, RAD50, RAD51C, RAD51D, SDHB, SDHC, SDHD, SMAD4, SMARCA4. STK11, TP53, TSC1, TSC2, and VHL.  The following genes were evaluated for sequence changes only: SDHA and HOXB13 c.251G>A variant only.  Results: Negative, no pathogenic variants identified.  The date of this test report is 11/25/2017.    12/05/2017 Surgery   Right mastectomy: Invasive ductal carcinoma 1.1 cm with lymphovascular invasion, 1/5 lymph nodes positive, grade 2, ER 90%, PR 100%,  Ki-67 2%, HER-2 negative T1bN1 Stage 1B.  Mammaprint low risk   01/31/2018 - 03/14/2018 Radiation Therapy   Adjuvant XRT at [facility]   04/2018 -  Anti-estrogen oral therapy   Tamoxifen daily   06/25/2018 Cancer Staging   Staging form: Breast, AJCC 8th Edition - Pathologic: Stage IA (pT1b, pN1, cM0, G2, ER+, PR+, HER2-) - Signed by Gardenia Phlegm, NP on 06/25/2018     CHIEF COMPLIANT: Follow-up of right breast cancer on tamoxifen  INTERVAL HISTORY: Erica Higgins is a 49 y.o. with above-mentioned history of right breast cancer treated with mastectomy, radiation, and who is currently on tamoxifen. She presents to the clinic today for follow-up.   The muscle aches and pains are markedly better on 10 mg dosage.  She has bilateral osteoarthritis now.  She also has recent attacks of migraine and is seeing neurology for that.  ALLERGIES:  is allergic to cleocin [clindamycin hcl]; ivp dye [iodinated diagnostic agents]; latex; penicillins; shellfish allergy; and adhesive [tape].  MEDICATIONS:  Current Outpatient Medications  Medication Sig Dispense Refill  . ciprofloxacin (CIPRO) 500 MG tablet Take 500 mg by mouth 2 (two) times daily.    Marland Kitchen ibuprofen (ADVIL,MOTRIN) 800 MG tablet Take 800 mg by mouth every 8 (eight) hours as needed.    . ondansetron (ZOFRAN) 4 MG tablet Take 4 mg by mouth every 8 (eight) hours as needed for nausea or vomiting.    . predniSONE (DELTASONE) 10 MG tablet Take 10 mg by mouth 2 (two) times daily with a meal.    . tamoxifen (NOLVADEX) 20 MG tablet TAKE 1 TABLET BY MOUTH DAILY 90 tablet 3  . zolmitriptan (ZOMIG-ZMT) 5 MG disintegrating tablet Take 5 mg by mouth as needed.  No current facility-administered medications for this visit.    PHYSICAL EXAMINATION: ECOG PERFORMANCE STATUS: 1 - Symptomatic but completely ambulatory  Vitals:   10/08/19 0930  BP: 127/69  Pulse: 76  Resp: 19  Temp: 97.8 F (36.6 C)  SpO2: 100%   Filed Weights   10/08/19 0930   Weight: 253 lb 14.4 oz (115.2 kg)    LABORATORY DATA:  I have reviewed the data as listed CMP Latest Ref Rng & Units 09/11/2018 11/13/2017 02/29/2016  Glucose 70 - 99 mg/dL 137(H) 123 102(H)  BUN 6 - 23 mg/dL 9 12 13   Creatinine 0.40 - 1.20 mg/dL 0.82 0.84 0.74  Sodium 135 - 145 mEq/L 140 139 140  Potassium 3.5 - 5.1 mEq/L 3.8 3.8 3.8  Chloride 96 - 112 mEq/L 106 104 98  CO2 19 - 32 mEq/L 27 29 24   Calcium 8.4 - 10.5 mg/dL 8.9 9.4 9.1  Total Protein 6.0 - 8.3 g/dL 6.9 7.4 7.2  Total Bilirubin 0.2 - 1.2 mg/dL 0.4 0.5 <0.2  Alkaline Phos 39 - 117 U/L 69 87 94  AST 0 - 37 U/L 22 16 18   ALT 0 - 35 U/L 27 22 17     Lab Results  Component Value Date   WBC 6.6 09/11/2018   HGB 13.2 09/11/2018   HCT 39.4 09/11/2018   MCV 87.6 09/11/2018   PLT 331.0 09/11/2018   NEUTROABS 4.9 09/11/2018    ASSESSMENT & PLAN:  Malignant neoplasm of lower-outer quadrant of right breast of female, estrogen receptor positive ([place]) 12/05/17:Right mastectomy: Invasive ductal carcinoma 1.1 cm with lymphovascular invasion, 1/5 lymph nodes positive, grade 2, ER 90%, PR 100%, Ki-67 2%, HER-2 negative T1bN1 Stage 1B Mammaprint 12/21/2017: Luminal type A low risk Adjuvant radiation therapy completed  Plan: Tamoxifen 20 mg daily August 2019 dose reduced to 10 mg on June 19, 2019 because of severe joint pains and stiffness  Tamoxifen toxicities:  1.  Joint pains and stiffness: Much better on 10 mg dose Recent diagnosis of migraines and osteoarthritis  Breast cancer surveillance: 1.  Breast exam October 08, 2019: Benign 2.  Mammogram left breast  Return to clinic in 1 year for follow-up    No orders of the defined types were placed in this encounter.  The patient has a good understanding of the overall plan. she agrees with it. she will call with any problems that may develop before the next visit here.  Total time spent: 20 mins including face to face time and time spent for planning, charting and  coordination of care  Nicholas Lose, MD 10/08/2019  I, Cloyde Reams Dorshimer, am acting as scribe for Dr. Nicholas Lose.  I have reviewed the above documentation for accuracy and completeness, and I agree with the above.

## 2019-10-08 ENCOUNTER — Inpatient Hospital Stay: Attending: Hematology and Oncology | Admitting: Hematology and Oncology

## 2019-10-08 ENCOUNTER — Other Ambulatory Visit: Payer: Self-pay

## 2019-10-08 DIAGNOSIS — C50511 Malignant neoplasm of lower-outer quadrant of right female breast: Secondary | ICD-10-CM

## 2019-10-08 DIAGNOSIS — Z79810 Long term (current) use of selective estrogen receptor modulators (SERMs): Secondary | ICD-10-CM | POA: Insufficient documentation

## 2019-10-08 DIAGNOSIS — Z170 Estrogen receptor positive status [ER+]: Secondary | ICD-10-CM | POA: Diagnosis not present

## 2019-10-08 MED ORDER — TAMOXIFEN CITRATE 10 MG PO TABS: 10.0000 mg | ORAL_TABLET | Freq: Every day | ORAL | 3 refills | Status: DC

## 2019-10-08 NOTE — Assessment & Plan Note (Signed)
12/05/17:Right mastectomy: Invasive ductal carcinoma 1.1 cm with lymphovascular invasion, 1/5 lymph nodes positive, grade 2, ER 90%, PR 100%, Ki-67 2%, HER-2 negative T1bN1 Stage 1B Mammaprint 12/21/2017: Luminal type A low risk Adjuvant radiation therapy completed  Plan: Tamoxifen 20 mg daily August 2019 dose reduced to 10 mg on June 19, 2019 because of severe joint pains and stiffness  Tamoxifen toxicities:  1.  Joint pains and stiffness:  Breast cancer surveillance: 1.  Breast exam October 08, 2019: Benign 2.  Mammogram  Return to clinic in 1 year for follow-up

## 2019-10-09 ENCOUNTER — Telehealth: Payer: Self-pay | Admitting: Hematology and Oncology

## 2019-10-09 NOTE — Telephone Encounter (Signed)
I talk with patient regarding schedule  

## 2019-10-15 ENCOUNTER — Encounter: Payer: Self-pay | Admitting: Neurology

## 2019-10-15 ENCOUNTER — Ambulatory Visit (INDEPENDENT_AMBULATORY_CARE_PROVIDER_SITE_OTHER): Admitting: Neurology

## 2019-10-15 ENCOUNTER — Other Ambulatory Visit: Payer: Self-pay

## 2019-10-15 VITALS — BP 121/68 | HR 65 | Temp 97.7°F | Ht 63.0 in | Wt 256.5 lb

## 2019-10-15 DIAGNOSIS — G43109 Migraine with aura, not intractable, without status migrainosus: Secondary | ICD-10-CM

## 2019-10-15 DIAGNOSIS — Z853 Personal history of malignant neoplasm of breast: Secondary | ICD-10-CM | POA: Insufficient documentation

## 2019-10-15 DIAGNOSIS — R202 Paresthesia of skin: Secondary | ICD-10-CM | POA: Diagnosis not present

## 2019-10-15 DIAGNOSIS — R519 Headache, unspecified: Secondary | ICD-10-CM

## 2019-10-15 DIAGNOSIS — G8929 Other chronic pain: Secondary | ICD-10-CM

## 2019-10-15 MED ORDER — TOPIRAMATE 100 MG PO TABS: 100.0000 mg | ORAL_TABLET | Freq: Two times a day (BID) | ORAL | 11 refills | Status: DC

## 2019-10-15 MED ORDER — RIZATRIPTAN BENZOATE 10 MG PO TBDP: 10.0000 mg | ORAL_TABLET | ORAL | 11 refills | Status: DC | PRN

## 2019-10-15 NOTE — Progress Notes (Signed)
PATIENT: Erica Higgins DOB: Sep 30, 1970  Chief Complaint  Patient presents with  . Migraine    Reports headaches nearly daily. She estimates having migraines four days per week that involves that nausea, vomiting, blurred vision. She uses Zomig which usually works but recently she has required additional doses. She has never been on any type of preventive medication. She has never had a CT head or MRI brain. Previously seen in 2017 for paresthesia. Weather and Lysol are known trigger for her migraines.  Marland Kitchen PCP    Monico Blitz, MD     HISTORICAL  Erica Higgins is a 49 year old female, seen in request by her primary care physician Dr. Manuella Ghazi, [place], for evaluation of frequent headaches initial evaluation was on October 15, 2019.  I have reviewed and summarized the referring note from the referring physician.  She had a history of right breast cancer, that is post right lobectomy in [place] 2019 followed by radiation therapy, she is taking tamoxifen  She reported long history of migraine headaches, typical migraine are right lateralized severe pounding headache with associated light noise sensitivity, nausea vomiting, lasting for few hours to couple days, trigger for her migraine usually stress, weather changes, strong smells such as Lysol.  She used to have migraine headaches about once every couple months, but since December 2020, she has increased migraine headaches, began to have daily moderate headaches since January 2021, couple times, her migraines were so severe, prolonged, lasting for couple days despite she was taking maximized dose of Motrin 3200 mg, Zomig 2.5 mg 4 tablets, Zofran 4 mg 3 tablets in 24 hours  She denies lateralized motor or sensory deficit, sometimes preceding her headache, and during her headaches, she had blurry vision, usually on her left side.  She has been taking Zomig as abortive treatment, take up to 4 hours to take effect, sometimes she has to take second dose, she  has never tried other triptans in the past, she is not taking over-the-counter Tylenol or Motrin every other day  REVIEW OF SYSTEMS: Full 14 system review of systems performed and notable only for as above All other review of systems were negative.  ALLERGIES: Allergies  Allergen Reactions  . Cleocin [Clindamycin Hcl] Hives  . Ivp Dye [Iodinated Diagnostic Agents] Anaphylaxis  . Latex Hives  . Penicillins Hives  . Shellfish Allergy Anaphylaxis  . Adhesive [Tape]     Rash, itch     HOME MEDICATIONS: Current Outpatient Medications  Medication Sig Dispense Refill  . ibuprofen (ADVIL,MOTRIN) 800 MG tablet Take 800 mg by mouth every 8 (eight) hours as needed.    . ondansetron (ZOFRAN) 4 MG tablet Take 4 mg by mouth every 8 (eight) hours as needed for nausea or vomiting.    . tamoxifen (NOLVADEX) 10 MG tablet Take 1 tablet (10 mg total) by mouth daily. 90 tablet 3  . zolmitriptan (ZOMIG-ZMT) 5 MG disintegrating tablet Take 5 mg by mouth as needed.      No current facility-administered medications for this visit.    PAST MEDICAL HISTORY: Past Medical History:  Diagnosis Date  . Abdominal pain, acute, right upper quadrant   . Anxiety   . Back pain   . Depression   . Joint pain   . Migraine   . Obesity   . Pain    Toes on bilateral feet  . Polycystic ovarian disease   . Sleep apnea     PAST SURGICAL HISTORY: Past Surgical History:  Procedure Laterality Date  .  CESAREAN SECTION    . MINISCUS REPAIR  2003 and 2005  . SIMPLE MASTECTOMY WITH AXILLARY SENTINEL NODE BIOPSY Right 12/05/2017   Procedure: SIMPLE MASTECTOMY WITH WIRE LOCALIZATION AND  TARGETED RIGHT AXILLARY SENTINEL NODE MAPPING  AND BIOPSY ERAS PATHWAY;  Surgeon: Erroll Luna, MD;  Location: [facility];  Service: General;  Laterality: Right;  PECTORAL BLOCK  . TONSILLECTOMY  1996    FAMILY HISTORY: Family History  Problem Relation Age of Onset  . Emphysema Father        smoker  . Allergies  Father   . Hyperlipidemia Father   . Hypertension Father   . Allergies Mother   . Hypertension Mother   . Rheum arthritis Mother   . Osteoporosis Mother   . Skin cancer Mother   . Breast cancer Other   . Heart attack Paternal Grandfather 17  . Colon cancer Paternal Aunt        dx 50's/60's  . Pancreatic cancer Maternal Grandmother 21  . Colon cancer Paternal Uncle 75  . Skin cancer Paternal Grandmother   . Breast cancer Cousin        dx 30's/40's  . Lung cancer Paternal Uncle     SOCIAL HISTORY: Social History   Socioeconomic History  . Marital status: Single    Spouse name: Not on file  . Number of children: 2  . Years of education: Bachelors  . Highest education level: Not on file  Occupational History  . Occupation: [employer] - [employer]  Tobacco Use  . Smoking status: Former Smoker    Packs/day: 0.30    Years: 2.00    Pack years: 0.60    Types: Cigarettes    Quit date: 09/03/1992    Years since quitting: 27.1  . Smokeless tobacco: Never Used  Substance and Sexual Activity  . Alcohol use: Yes    Alcohol/week: 0.0 standard drinks  . Drug use: No  . Sexual activity: Not on file  Other Topics Concern  . Not on file  Social History Narrative   Lives at home with her two children (twins).   Right-handed.   2 cups caffeine per day.   Social Determinants of Health   Financial Resource Strain:   . Difficulty of Paying Living Expenses: Not on file  Food Insecurity:   . Worried About Charity fundraiser in the Last Year: Not on file  . Ran Out of Food in the Last Year: Not on file  Transportation Needs:   . Lack of Transportation (Medical): Not on file  . Lack of Transportation (Non-Medical): Not on file  Physical Activity:   . Days of Exercise per Week: Not on file  . Minutes of Exercise per Session: Not on file  Stress:   . Feeling of Stress : Not on file  Social Connections:   . Frequency of Communication with Friends and Family: Not on file  .  Frequency of Social Gatherings with Friends and Family: Not on file  . Attends Religious Services: Not on file  . Active Member of Clubs or Organizations: Not on file  . Attends Archivist Meetings: Not on file  . Marital Status: Not on file  Intimate Partner Violence:   . Fear of Current or Ex-Partner: Not on file  . Emotionally Abused: Not on file  . Physically Abused: Not on file  . Sexually Abused: Not on file     PHYSICAL EXAM   Vitals:   10/15/19 1047  BP: 121/68  Pulse: 65  Temp: 97.7 F (36.5 C)  Weight: 256 lb 8 oz (116.3 kg)  Height: 5\' 3"  (1.6 m)    Not recorded      Body mass index is 45.44 kg/m.  PHYSICAL EXAMNIATION:  Gen: NAD, conversant, well nourised, well groomed                     Cardiovascular: Regular rate rhythm, no peripheral edema, warm, nontender. Eyes: Conjunctivae clear without exudates or hemorrhage Neck: Supple, no carotid bruits. Pulmonary: Clear to auscultation bilaterally   NEUROLOGICAL EXAM:  MENTAL STATUS: Speech:    Speech is normal; fluent and spontaneous with normal comprehension.  Cognition:     Orientation to time, place and person     Normal recent and remote memory     Normal Attention span and concentration     Normal Language, naming, repeating,spontaneous speech     Fund of knowledge   CRANIAL NERVES: CN II: Visual fields are full to confrontation. Pupils are round equal and briskly reactive to light. CN III, IV, VI: extraocular movement are normal. No ptosis. CN V: Facial sensation is intact to light touch CN VII: Face is symmetric with normal eye closure  CN VIII: Hearing is normal to causal conversation. CN IX, X: Phonation is normal. CN XI: Head turning and shoulder shrug are intact  MOTOR: There is no pronator drift of out-stretched arms. Muscle bulk and tone are normal. Muscle strength is normal.  REFLEXES: Reflexes are 2+ and symmetric at the biceps, triceps, knees, and ankles. Plantar  responses are flexor.  SENSORY: Intact to light touch, pinprick and vibratory sensation are intact in fingers and toes.  COORDINATION: There is no trunk or limb dysmetria noted.  GAIT/STANCE: Posture is normal. Gait is steady with normal steps, base, arm swing, and turning. Heel and toe walking are normal. Tandem gait is normal.  Romberg is absent.   DIAGNOSTIC DATA (LABS, IMAGING, TESTING) - I reviewed patient records, labs, notes, testing and imaging myself where available.   ASSESSMENT AND PLAN  Erica Higgins is a 49 y.o. female   Worsening daily headaches  History of right breast cancer  Need to rule out brain structural lesion, laboratory evaluations, including creatinine, MRI of the brain with without contrast  Most likely is worsening migraine headache, likely a component of medicine rebound headaches  Start preventive medication Topamax 100 mg twice a day, Maxalt 10 mg as needed may combine it with Aleve, Zofran  Marcial Pacas, M.D. Ph.D.  [facility] [address], [place], [place] [address] Ph: [phone] Fax: [phone]  CC: Monico Blitz, MD

## 2019-10-16 LAB — CBC WITH DIFFERENTIAL
Basophils Absolute: 0.1 10*3/uL (ref 0.0–0.2)
Basos: 1 %
EOS (ABSOLUTE): 0.3 10*3/uL (ref 0.0–0.4)
Eos: 3 %
Hematocrit: 40.3 % (ref 34.0–46.6)
Hemoglobin: 13.4 g/dL (ref 11.1–15.9)
Immature Grans (Abs): 0 10*3/uL (ref 0.0–0.1)
Immature Granulocytes: 0 %
Lymphocytes Absolute: 2.1 10*3/uL (ref 0.7–3.1)
Lymphs: 26 %
MCH: 29.9 pg (ref 26.6–33.0)
MCHC: 33.3 g/dL (ref 31.5–35.7)
MCV: 90 fL (ref 79–97)
Monocytes Absolute: 0.7 10*3/uL (ref 0.1–0.9)
Monocytes: 8 %
Neutrophils Absolute: 4.9 10*3/uL (ref 1.4–7.0)
Neutrophils: 62 %
RBC: 4.48 x10E6/uL (ref 3.77–5.28)
RDW: 12 % (ref 11.7–15.4)
WBC: 8 10*3/uL (ref 3.4–10.8)

## 2019-10-16 LAB — COMPREHENSIVE METABOLIC PANEL
ALT: 23 IU/L (ref 0–32)
AST: 25 IU/L (ref 0–40)
Albumin/Globulin Ratio: 1.5 (ref 1.2–2.2)
Albumin: 3.8 g/dL (ref 3.8–4.8)
Alkaline Phosphatase: 88 IU/L (ref 39–117)
BUN/Creatinine Ratio: 14 (ref 9–23)
BUN: 10 mg/dL (ref 6–24)
Bilirubin Total: <0.2 mg/dL (ref 0.0–1.2)
CO2: 26 mmol/L (ref 20–29)
Calcium: 9 mg/dL (ref 8.7–10.2)
Chloride: 105 mmol/L (ref 96–106)
Creatinine, Ser: 0.74 mg/dL (ref 0.57–1.00)
GFR calc Af Amer: 112 mL/min/{1.73_m2} (ref >59)
GFR calc non Af Amer: 97 mL/min/{1.73_m2} (ref >59)
Globulin, Total: 2.5 g/dL (ref 1.5–4.5)
Glucose: 73 mg/dL (ref 65–99)
Potassium: 4.5 mmol/L (ref 3.5–5.2)
Sodium: 143 mmol/L (ref 134–144)
Total Protein: 6.3 g/dL (ref 6.0–8.5)

## 2019-10-16 LAB — TSH: TSH: 1.02 u[IU]/mL (ref 0.450–4.500)

## 2019-10-19 ENCOUNTER — Telehealth: Payer: Self-pay | Admitting: *Deleted

## 2019-10-19 NOTE — Telephone Encounter (Signed)
I spoke to patient and verbalized understanding of her lab results.

## 2019-10-19 NOTE — Telephone Encounter (Signed)
-----   Message from Marcial Pacas, MD sent at 10/19/2019  9:58 AM EST ----- Please call patient for normal laboratory result

## 2019-10-27 NOTE — Telephone Encounter (Signed)
Phone rep checked office voicemail's, at 9:43 pt left a message stating she has not heard from anyone re: the approval from her insurance company about the approval of the CT scan for her.  Pt is asking for a call back from RN with an update

## 2019-10-27 NOTE — Addendum Note (Signed)
Addended by: Marcial Pacas on: 10/27/2019 12:23 PM   Modules accepted: Orders

## 2019-10-28 NOTE — Telephone Encounter (Signed)
[employer] no auth order sent to GI. They will reach out to the patient to schedule.

## 2019-11-24 ENCOUNTER — Telehealth: Payer: Self-pay | Admitting: Neurology

## 2019-11-24 NOTE — Telephone Encounter (Signed)
Pt has called to report that since on the topiramate (TOPAMAX) 100 MG tablet she has a sour stomach and can only drink water.  Pt states even when she eats she has a sour taste. Pt states she has not have any migraines but she would like to know if it  Is possible to lower the dosage to a degree that she does not have migraines but also to a point where she does not have this sour stomach feeling and sour taste re: everything she eats.  Pt is asking to be called

## 2019-11-24 NOTE — Telephone Encounter (Signed)
I spoke to the patient. She would like to try topiramate a little longer. She is currently on 100mg  BID. She is going to decrease it to 50mg  in am and 100mg  in pm. If no improvement, she will further reduce to 50mg  BID. If no better on lower dose, then she will call our office back.

## 2019-11-28 ENCOUNTER — Ambulatory Visit
Admission: RE | Admit: 2019-11-28 | Discharge: 2019-11-28 | Disposition: A | Discharge status: Home / Self Care | Source: Ambulatory Visit | Attending: Neurology | Admitting: Neurology

## 2019-11-28 DIAGNOSIS — R519 Headache, unspecified: Secondary | ICD-10-CM

## 2019-11-28 DIAGNOSIS — G8929 Other chronic pain: Secondary | ICD-10-CM | POA: Diagnosis not present

## 2019-11-28 MED ORDER — GADOBENATE DIMEGLUMINE 529 MG/ML IV SOLN
20.0000 mL | Freq: Once | INTRAVENOUS | Status: AC | PRN
  Administered 2019-11-28: 20 mL via INTRAVENOUS

## 2019-11-30 ENCOUNTER — Telehealth: Payer: Self-pay | Admitting: Neurology

## 2019-11-30 NOTE — Telephone Encounter (Signed)
IMPRESSION: This MRI of the brain with and without contrast shows the following: 1.   There is a single punctate T2/flair hyperintense focus in the subcortical white matter in the left hemisphere.  This is normal for age and likely represents sequela of migraine or a small focus of chronic microvascular ischemic change. 2.   There are no acute findings and there is a normal enhancement pattern.  Please call patient, MRI of the brain showed no significant abnormalities.

## 2019-11-30 NOTE — Telephone Encounter (Signed)
I spoke to the patient and provided her with the MRI results below. She verbalized understanding.

## 2019-12-07 NOTE — Progress Notes (Addendum)
Virtual Visit via Video Note  I connected with Shaunae Lonsway on 12/10/19 at  3:00 PM EDT by a video enabled telemedicine application and verified that I am speaking with the correct person using two identifiers.   I discussed the limitations of evaluation and management by telemedicine and the availability of in person appointments. The patient expressed understanding and agreed to proceed.    I discussed the assessment and treatment plan with the patient. The patient was provided an opportunity to ask questions and all were answered. The patient agreed with the plan and demonstrated an understanding of the instructions.   The patient was advised to call back or seek an in-person evaluation if the symptoms worsen or if the condition fails to improve as anticipated.  I provided 40 minutes of non-face-to-face time during this encounter.   Norman Clay, MD     Psychiatric Initial Adult Assessment   Patient Identification: Rashia Francom MRN:  [id number] Date of Evaluation:  12/10/2019 Referral Source: Dr. Angelina Pih Chief Complaint:   Chief Complaint    Depression; Psychiatric Evaluation     Visit Diagnosis:    ICD-10-CM   1. MDD (major depressive disorder), recurrent episode, mild ([place])  F33.0     History of Present Illness:   Emmaclaire Hemann is a 49 y.o. year old female with a history of right breast cancer, treated with mastectomy, radiation, and who is currently on tamoxifen, migraine, who is referred for depression.   She states that she [facility]. She also states she has been irritable, feeling fatigue, and cried at work, which is not characteristic for her. She has been very stressed lately. She had mammography on 3/27. She has been breast cancer free for two years. She recently had MRI for migraine. She always has a fear that something else is going on. She also talks about loss of her friend in [employer], who was killed, being attacked in  [place] 15 years ago.  She used to take care of her children, who are grown now. She states that reading [facility] or gathering with her [place] friends reminds her of the loss of this friend. She works at [employer], and it has been stressful due to short staffed.  She reports good relationship with her 47 year old twins.  She reports good support from her parents, who take care of her children while she is at work. She believes her mood has been improving since starting fluoxetine about a month ago.   She denies alcohol use, drug use.   Medication- Fluoxetine for a month  Associated Signs/Symptoms: Depression Symptoms:  depressed mood, anhedonia, fatigue, difficulty concentrating, anxiety, (Hypo) Manic Symptoms:  Irritable Mood, denies decreased need for sleep, euphoria Anxiety Symptoms:  mild anxiety Psychotic Symptoms:  denies AH, VH PTSD Symptoms: Negative  Past Psychiatric History:  Outpatient: for PTSD in 2011, [place]  Psychiatry admission: denies  Previous suicide attempt: denies  Past trials of medication: fluoxetine History of violence: denies  Previous Psychotropic Medications: Yes   Substance Abuse History in the last 12 months:  No.  Consequences of Substance Abuse: Negative  Past Medical History:  Past Medical History:  Diagnosis Date  . Abdominal pain, acute, right upper quadrant   . Anxiety   . Back pain   . Depression   . Joint pain   . Migraine   . Obesity   . Pain    Toes on bilateral feet  . Polycystic ovarian disease   .  Sleep apnea     Past Surgical History:  Procedure Laterality Date  . CESAREAN SECTION    . MINISCUS REPAIR  2003 and 2005  . SIMPLE MASTECTOMY WITH AXILLARY SENTINEL NODE BIOPSY Right 12/05/2017   Procedure: SIMPLE MASTECTOMY WITH WIRE LOCALIZATION AND  TARGETED RIGHT AXILLARY SENTINEL NODE MAPPING  AND BIOPSY ERAS PATHWAY;  Surgeon: Erroll Luna, MD;  Location: [facility];  Service: General;   Laterality: Right;  PECTORAL BLOCK  . TONSILLECTOMY  1996    Family Psychiatric History:  As below  Family History:  Family History  Problem Relation Age of Onset  . Emphysema Father        smoker  . Allergies Father   . Hyperlipidemia Father   . Hypertension Father   . Alcohol abuse Father   . Allergies Mother   . Hypertension Mother   . Rheum arthritis Mother   . Osteoporosis Mother   . Skin cancer Mother   . Breast cancer Other   . Heart attack Paternal Grandfather 26  . Colon cancer Paternal Aunt        dx 50's/60's  . Pancreatic cancer Maternal Grandmother 13  . Colon cancer Paternal Uncle 67  . Skin cancer Paternal Grandmother   . Breast cancer Cousin        dx 30's/40's  . Lung cancer Paternal Uncle     Social History:   Social History   Socioeconomic History  . Marital status: Single    Spouse name: Not on file  . Number of children: 2  . Years of education: Bachelors  . Highest education level: Not on file  Occupational History  . Occupation: [employer] - [employer]  Tobacco Use  . Smoking status: Former Smoker    Packs/day: 0.30    Years: 2.00    Pack years: 0.60    Types: Cigarettes    Quit date: 09/03/1992    Years since quitting: 27.2  . Smokeless tobacco: Never Used  Substance and Sexual Activity  . Alcohol use: Yes    Alcohol/week: 0.0 standard drinks  . Drug use: No  . Sexual activity: Not on file  Other Topics Concern  . Not on file  Social History Narrative   Lives at home with her two children (twins).   Right-handed.   2 cups caffeine per day.   Social Determinants of Health   Financial Resource Strain:   . Difficulty of Paying Living Expenses:   Food Insecurity:   . Worried About Charity fundraiser in the Last Year:   . Arboriculturist in the Last Year:   Transportation Needs:   . Film/video editor (Medical):   Marland Kitchen Lack of Transportation (Non-Medical):   Physical Activity:   . Days of Exercise per Week:   . Minutes  of Exercise per Session:   Stress:   . Feeling of Stress :   Social Connections:   . Frequency of Communication with Friends and Family:   . Frequency of Social Gatherings with Friends and Family:   . Attends Religious Services:   . Active Member of Clubs or Organizations:   . 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   She grew up in [place]. She states that it was rough as her father was seldomly sober. She was a only child. Her mother and her aunt took care of her. She loves her parents and good relationship with them.   Allergies:   Allergies  Allergen Reactions  . Cleocin [Clindamycin Hcl] Hives  . Ivp Dye [Iodinated Diagnostic Agents] Anaphylaxis  . Latex Hives  . Penicillins Hives  . Shellfish Allergy Anaphylaxis  . Adhesive [Tape]     Rash, itch     Metabolic Disorder Labs: No results found for: HGBA1C, MPG No results found for: PROLACTIN No results found for: CHOL, TRIG, HDL, CHOLHDL, VLDL, LDLCALC Lab Results  Component Value Date   TSH 1.020 10/15/2019    Therapeutic Level Labs: No results found for: LITHIUM No results found for: CBMZ No results found for: VALPROATE  Current Medications: Current Outpatient Medications  Medication Sig Dispense Refill  . escitalopram (LEXAPRO) 10 MG tablet 5 mg daily for one week, then 10 mg daily 30 tablet 1  . ibuprofen (ADVIL,MOTRIN) 800 MG tablet Take 800 mg by mouth every 8 (eight) hours as needed.    . ondansetron (ZOFRAN) 4 MG tablet Take 4 mg by mouth every 8 (eight) hours as needed for nausea or vomiting.    . rizatriptan (MAXALT-MLT) 10 MG disintegrating tablet Take 1 tablet (10 mg total)  by mouth as needed. May repeat in 2 hours if needed 15 tablet 11  . tamoxifen (NOLVADEX) 10 MG tablet Take 1 tablet (10 mg total) by mouth daily. 90 tablet 3  . topiramate (TOPAMAX) 100 MG tablet Take 1 tablet (100 mg total) by mouth 2 (two) times daily. (Patient taking differently: Take 50 mg by mouth 2 (two) times daily. ) 60 tablet 11  . zolmitriptan (ZOMIG-ZMT) 5 MG disintegrating tablet Take 5 mg by mouth as needed.      No current facility-administered medications for this visit.    Musculoskeletal: Strength & Muscle Tone: N/A Gait & Station: N/A Patient leans: N/A  Psychiatric Specialty Exam: Review of Systems  Psychiatric/Behavioral: Positive for decreased concentration, dysphoric mood and sleep disturbance. Negative for agitation, behavioral problems, confusion, hallucinations, self-injury and suicidal ideas. The patient is nervous/anxious. The patient is not hyperactive.   All other systems reviewed and are negative.   There were no vitals taken for this visit.There is no height or weight on file to calculate BMI.  General Appearance: Fairly Groomed  Eye Contact:  Good  Speech:  Clear and Coherent  Volume:  Normal  Mood:  Depressed  Affect:  Appropriate, Congruent and euthymic, reactive  Thought Process:  Coherent  Orientation:  Full (Time, Place, and Person)  Thought Content:  Logical  Suicidal Thoughts:  No  Homicidal Thoughts:  No  Memory:  Immediate;   Good  Judgement:  Good  Insight:  Good  Psychomotor Activity:  Normal  Concentration:  Concentration: Good and Attention Span: Good  Recall:  Good  Fund of Knowledge:Good  Language: Good  Akathisia:  No  Handed:  Right  AIMS (if indicated):  not done  Assets:  Communication Skills Desire for Improvement  ADL's:  Intact  Cognition: WNL  Sleep:  Poor   Screenings:   Assessment and Plan:  Felita Villamor is a 49 y.o. year old female with a history of right breast cancer, treated with mastectomy, radiation, and  who is currently on tamoxifen, migraine, who is referred  for depression.   # MDD, mild, recurrent without psychotic features She reports depressive symptoms which has been slightly improving since starting fluoxetine.  Psychosocial stressors includes migraine, history of breast cancer, and loss of her best friend in [employer] years ago.  Although she did have good benefit from fluoxetine, Will switch from fluoxetine to Lexapro to minimize its potential interaction with tamoxifen.  Discussed potential risk of GI side effect.  She will greatly benefit from CBT; will make a referral.    Plan 1. Start lexapro 5 mg daily for one week, then 10 mg daily  2. Discontinue fluoxetine  3. Referral to therapy 4.  Next appointment- 5/13 at 3:30 for 30 mins, video  The patient demonstrates the following risk factors for suicide: Chronic risk factors for suicide include: psychiatric disorder of depression. Acute risk factors for suicide include: N/A. Protective factors for this patient include: positive social support, responsibility to others (children, family), coping skills and hope for the future. Considering these factors, the overall suicide risk at this point appears to be low. Patient is appropriate for outpatient follow up.   Norman Clay, MD 4/8/20214:01 PM

## 2019-12-10 ENCOUNTER — Ambulatory Visit (INDEPENDENT_AMBULATORY_CARE_PROVIDER_SITE_OTHER): Admitting: Psychiatry

## 2019-12-10 ENCOUNTER — Other Ambulatory Visit: Payer: Self-pay

## 2019-12-10 ENCOUNTER — Encounter (HOSPITAL_COMMUNITY): Payer: Self-pay | Admitting: Psychiatry

## 2019-12-10 DIAGNOSIS — F330 Major depressive disorder, recurrent, mild: Secondary | ICD-10-CM

## 2019-12-10 MED ORDER — ESCITALOPRAM OXALATE 10 MG PO TABS: ORAL_TABLET | ORAL | 1 refills | Status: DC

## 2019-12-10 NOTE — Patient Instructions (Signed)
1. Start lexapro 5 mg daily for one week, then 10 mg daily  2. Discontinue fluoxetine  3. Referral to therapy 4.  Next appointment- 5/13 at 3:30

## 2019-12-21 NOTE — Progress Notes (Signed)
PATIENT: Erica Higgins DOB: Jul 29, 1971  REASON FOR VISIT: follow up HISTORY FROM: patient  HISTORY OF PRESENT ILLNESS: Today 12/22/19  HISTORY Erica Higgins is a 49 year old female, seen in request by her primary care physician Dr. Manuella Ghazi, [place] [facility], for evaluation of frequent headaches initial evaluation was on October 15, 2019.  I have reviewed and summarized the referring note from the referring physician.  She had a history of right breast cancer, that is post right lobectomy in [place] 2019 followed by radiation therapy, she is taking tamoxifen  She reported long history of migraine headaches, typical migraine are right lateralized severe pounding headache with associated light noise sensitivity, nausea vomiting, lasting for few hours to couple days, trigger for her migraine usually stress, weather changes, strong smells such as Lysol.  She used to have migraine headaches about once every couple months, but since December 2020, she has increased migraine headaches, began to have daily moderate headaches since January 2021, couple times, her migraines were so severe, prolonged, lasting for couple days despite she was taking maximized dose of Motrin 3200 mg, Zomig 2.5 mg 4 tablets, Zofran 4 mg 3 tablets in 24 hours  She denies lateralized motor or sensory deficit, sometimes preceding her headache, and during her headaches, she had blurry vision, usually on her left side.  She has been taking Zomig as abortive treatment, take up to 4 hours to take effect, sometimes she has to take second dose, she has never tried other triptans in the past, she is not taking over-the-counter Tylenol or Motrin every other day  Update [place] 20, 2021 SS: CBC, CMP, TSH were normal.  MRI of the brain showed no significant abnormalities.  Could not tolerate Topamax 100 mg twice a day, due to side effect of sour stomach.  She is now taking Topamax 50 mg twice a day, doing well, headaches much improved.  In the last  over 2 months has only had 2 headaches.  Both times, took Maxalt once with good benefit, along with Zofran, she was able to go to work.  Is overall pleased with medications in progress. Lost almost 20 lbs since starting Topamax.  REVIEW OF SYSTEMS: Out of a complete 14 system review of symptoms, the patient complains only of the following symptoms, and all other reviewed systems are negative.  Headaches   ALLERGIES: Allergies  Allergen Reactions  . Cleocin [Clindamycin Hcl] Hives  . Ivp Dye [Iodinated Diagnostic Agents] Anaphylaxis  . Latex Hives  . Penicillins Hives  . Shellfish Allergy Anaphylaxis  . Adhesive [Tape]     Rash, itch     HOME MEDICATIONS: Outpatient Medications Prior to Visit  Medication Sig Dispense Refill  . escitalopram (LEXAPRO) 10 MG tablet 5 mg daily for one week, then 10 mg daily 30 tablet 1  . ibuprofen (ADVIL,MOTRIN) 800 MG tablet Take 800 mg by mouth every 8 (eight) hours as needed.    . rizatriptan (MAXALT-MLT) 10 MG disintegrating tablet Take 1 tablet (10 mg total) by mouth as needed. May repeat in 2 hours if needed 15 tablet 11  . tamoxifen (NOLVADEX) 10 MG tablet Take 1 tablet (10 mg total) by mouth daily. 90 tablet 3  . zolmitriptan (ZOMIG-ZMT) 5 MG disintegrating tablet Take 5 mg by mouth as needed.     . ondansetron (ZOFRAN) 4 MG tablet Take 4 mg by mouth every 8 (eight) hours as needed for nausea or vomiting.    . topiramate (TOPAMAX) 100 MG tablet Take 1 tablet (100 mg  total) by mouth 2 (two) times daily. (Patient taking differently: Take 50 mg by mouth 2 (two) times daily. ) 60 tablet 11   No facility-administered medications prior to visit.    PAST MEDICAL HISTORY: Past Medical History:  Diagnosis Date  . Abdominal pain, acute, right upper quadrant   . Anxiety   . Back pain   . Depression   . Joint pain   . Migraine   . Obesity   . Pain    Toes on bilateral feet  . Polycystic ovarian disease   . Sleep apnea     PAST SURGICAL  HISTORY: Past Surgical History:  Procedure Laterality Date  . CESAREAN SECTION    . MINISCUS REPAIR  2003 and 2005  . SIMPLE MASTECTOMY WITH AXILLARY SENTINEL NODE BIOPSY Right 12/05/2017   Procedure: SIMPLE MASTECTOMY WITH WIRE LOCALIZATION AND  TARGETED RIGHT AXILLARY SENTINEL NODE MAPPING  AND BIOPSY ERAS PATHWAY;  Surgeon: Erroll Luna, MD;  Location: [facility];  Service: General;  Laterality: Right;  PECTORAL BLOCK  . TONSILLECTOMY  1996    FAMILY HISTORY: Family History  Problem Relation Age of Onset  . Emphysema Father        smoker  . Allergies Father   . Hyperlipidemia Father   . Hypertension Father   . Alcohol abuse Father   . Allergies Mother   . Hypertension Mother   . Rheum arthritis Mother   . Osteoporosis Mother   . Skin cancer Mother   . Breast cancer Other   . Heart attack Paternal Grandfather 65  . Colon cancer Paternal Aunt        dx 50's/60's  . Pancreatic cancer Maternal Grandmother 65  . Colon cancer Paternal Uncle 82  . Skin cancer Paternal Grandmother   . Breast cancer Cousin        dx 30's/40's  . Lung cancer Paternal Uncle     SOCIAL HISTORY: Social History   Socioeconomic History  . Marital status: Single    Spouse name: Not on file  . Number of children: 2  . Years of education: Bachelors  . Highest education level: Not on file  Occupational History  . Occupation: [employer] - [employer]  Tobacco Use  . Smoking status: Former Smoker    Packs/day: 0.30    Years: 2.00    Pack years: 0.60    Types: Cigarettes    Quit date: 09/03/1992    Years since quitting: 27.3  . Smokeless tobacco: Never Used  Substance and Sexual Activity  . Alcohol use: Yes    Alcohol/week: 0.0 standard drinks  . Drug use: No  . Sexual activity: Not on file  Other Topics Concern  . Not on file  Social History Narrative   Lives at home with her two children (twins).   Right-handed.   2 cups caffeine per day.   Social Determinants of  Health   Financial Resource Strain:   . Difficulty of Paying Living Expenses:   Food Insecurity:   . Worried About Charity fundraiser in the Last Year:   . Arboriculturist in the Last Year:   Transportation Needs:   . Film/video editor (Medical):   Marland Kitchen Lack of Transportation (Non-Medical):   Physical Activity:   . Days of Exercise per Week:   . Minutes of Exercise per Session:   Stress:   . Feeling of Stress :   Social Connections:   . Frequency of Communication with  Friends and Family:   . Frequency of Social Gatherings with Friends and Family:   . Attends Religious Services:   . Active Member of Clubs or Organizations:   . Attends Archivist Meetings:   Marland Kitchen Marital Status:   Intimate Partner Violence:   . Fear of Current or Ex-Partner:   . Emotionally Abused:   Marland Kitchen Physically Abused:   . Sexually Abused:    PHYSICAL EXAM  Vitals:   12/22/19 0901  BP: 130/80  Pulse: 72  Weight: 238 lb (108 kg)  Height: 5\' 3"  (1.6 m)   Body mass index is 42.16 kg/m.  Generalized: Well developed, in no acute distress   Neurological examination  Mentation: Alert oriented to time, place, history taking. Follows all commands speech and language fluent Cranial nerve II-XII: Pupils were equal round reactive to light. Extraocular movements were full, visual field were full on confrontational test. Facial sensation and strength were normal. Head turning and shoulder shrug  were normal and symmetric. Motor: The motor testing reveals 5 over 5 strength of all 4 extremities. Good symmetric motor tone is noted throughout.  Sensory: Sensory testing is intact to soft touch on all 4 extremities. No evidence of extinction is noted.  Coordination: Cerebellar testing reveals good finger-nose-finger and heel-to-shin bilaterally.  Gait and station: Gait is normal. Tandem gait is normal. Reflexes: Deep tendon reflexes are symmetric and normal bilaterally.   DIAGNOSTIC DATA (LABS, IMAGING,  TESTING) - I reviewed patient records, labs, notes, testing and imaging myself where available.  Lab Results  Component Value Date   WBC 8.0 10/15/2019   HGB 13.4 10/15/2019   HCT 40.3 10/15/2019   MCV 90 10/15/2019   PLT 331.0 09/11/2018      Component Value Date/Time   NA 143 10/15/2019 1202   K 4.5 10/15/2019 1202   CL 105 10/15/2019 1202   CO2 26 10/15/2019 1202   GLUCOSE 73 10/15/2019 1202   GLUCOSE 137 (H) 09/11/2018 0901   BUN 10 10/15/2019 1202   CREATININE 0.74 10/15/2019 1202   CREATININE 0.84 11/13/2017 0840   CALCIUM 9.0 10/15/2019 1202   PROT 6.3 10/15/2019 1202   ALBUMIN 3.8 10/15/2019 1202   AST 25 10/15/2019 1202   AST 16 11/13/2017 0840   ALT 23 10/15/2019 1202   ALT 22 11/13/2017 0840   ALKPHOS 88 10/15/2019 1202   BILITOT <0.2 10/15/2019 1202   BILITOT 0.5 11/13/2017 0840   GFRNONAA 97 10/15/2019 1202   GFRNONAA >60 11/13/2017 0840   GFRAA 112 10/15/2019 1202   GFRAA >60 11/13/2017 0840   No results found for: CHOL, HDL, LDLCALC, LDLDIRECT, TRIG, CHOLHDL No results found for: HGBA1C Lab Results  Component Value Date   VITAMINB12 254 02/29/2016   Lab Results  Component Value Date   TSH 1.020 10/15/2019      ASSESSMENT AND PLAN 49 y.o. year old female  has a past medical history of Abdominal pain, acute, right upper quadrant, Anxiety, Back pain, Depression, Joint pain, Migraine, Obesity, Pain, Polycystic ovarian disease, and Sleep apnea. here with:  1.  Worsening daily headaches -History of right breast cancer -CBC, CMP, TSH were normal -MRI of the brain showed no significant abnormalities -Headaches much improved with Topamax -Continue Topamax 50 mg twice daily  -Continue Maxalt as needed, may combine with Aleve, Zofran for prolonged headache -Follow-up in 6 months or sooner if needed  I spent 20 minutes of face-to-face and non-face-to-face time with patient.  This included previsit chart review, lab  review, study review, order entry,  electronic health record documentation, patient education.  Butler Denmark, AGNP-C, DNP 12/22/2019, 9:28 AM [facility] [address] [place], [place] [address] [phone]

## 2019-12-22 ENCOUNTER — Encounter: Payer: Self-pay | Admitting: Neurology

## 2019-12-22 ENCOUNTER — Other Ambulatory Visit: Payer: Self-pay

## 2019-12-22 ENCOUNTER — Ambulatory Visit (INDEPENDENT_AMBULATORY_CARE_PROVIDER_SITE_OTHER): Admitting: Neurology

## 2019-12-22 VITALS — BP 130/80 | HR 72 | Ht 63.0 in | Wt 238.0 lb

## 2019-12-22 DIAGNOSIS — G43109 Migraine with aura, not intractable, without status migrainosus: Secondary | ICD-10-CM

## 2019-12-22 MED ORDER — TOPIRAMATE 50 MG PO TABS: 50.0000 mg | ORAL_TABLET | Freq: Two times a day (BID) | ORAL | 3 refills | Status: DC

## 2019-12-22 MED ORDER — ONDANSETRON HCL 4 MG PO TABS: 4.0000 mg | ORAL_TABLET | Freq: Three times a day (TID) | ORAL | 1 refills | Status: DC | PRN

## 2019-12-22 NOTE — Patient Instructions (Signed)
It was nice to see you today! Continue current medications  See you back in 6 months

## 2019-12-28 ENCOUNTER — Encounter: Payer: Self-pay | Admitting: Pulmonary Disease

## 2019-12-28 ENCOUNTER — Ambulatory Visit (INDEPENDENT_AMBULATORY_CARE_PROVIDER_SITE_OTHER): Admitting: Pulmonary Disease

## 2019-12-28 ENCOUNTER — Other Ambulatory Visit: Payer: Self-pay

## 2019-12-28 VITALS — BP 122/64 | HR 63 | Temp 98.0°F | Ht 63.0 in | Wt 239.4 lb

## 2019-12-28 DIAGNOSIS — R059 Cough, unspecified: Secondary | ICD-10-CM

## 2019-12-28 DIAGNOSIS — R05 Cough: Secondary | ICD-10-CM

## 2019-12-28 DIAGNOSIS — R918 Other nonspecific abnormal finding of lung field: Secondary | ICD-10-CM

## 2019-12-28 DIAGNOSIS — R0602 Shortness of breath: Secondary | ICD-10-CM

## 2019-12-28 NOTE — Addendum Note (Signed)
Addended by: Elton Sin on: 12/28/2019 12:03 PM   Modules accepted: Orders

## 2019-12-28 NOTE — Patient Instructions (Addendum)
I am glad you are doing well with regard to your breathing We will get a CT chest without contrast. If this is normal then he can follow-up with [place] as needed.
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
## 2020-01-07 NOTE — Progress Notes (Signed)
Virtual Visit via Video Note  I connected with Erica Higgins on 01/14/20 at  3:30 PM EDT by a video enabled telemedicine application and verified that I am speaking with the correct person using two identifiers.   I discussed the limitations of evaluation and management by telemedicine and the availability of in person appointments. The patient expressed understanding and agreed to proceed.    I discussed the assessment and treatment plan with the patient. The patient was provided an opportunity to ask questions and all were answered. The patient agreed with the plan and demonstrated an understanding of the instructions.   The patient was advised to call back or seek an in-person evaluation if the symptoms worsen or if the condition fails to improve as anticipated.  I provided 15 minutes of non-face-to-face time during this encounter.   Norman Clay, MD    [facility] MD/PA/NP OP Progress Note  01/14/2020 3:52 PM Erica Higgins  MRN:  [id number]  Chief Complaint:  Chief Complaint    Depression; Follow-up     HPI:  This is a follow-up appointment for depression.  She states that she has been doing great.  She does not feel down to come to work anymore.  People ask her why she comes to work feeling happy and smiling.  She enjoys talking to her friend from [employer]. She does not feel sad over the loss of her friend anymore, referring that she enjoyed talking with this friend's son. She tries not to let things bother her compared to before. She feels less fatigue since tapering down topamax.  She denies insomnia.  She does not feel irritable.  She has good energy and motivation.  She enjoys taking a walk with her son.  She has good concentration.  She has good appetite.  She denies SI.  She denies anxiety or panic attacks except that she had one when she saw the patient with maget.    100 mg twice to 50 mg   Visit Diagnosis:    ICD-10-CM   1. MDD (major depressive disorder), recurrent, in partial  remission ([place])  F33.41     Past Psychiatric History: Please see initial evaluation for full details. I have reviewed the history. No updates at this time.     Past Medical History:  Past Medical History:  Diagnosis Date  . Abdominal pain, acute, right upper quadrant   . Anxiety   . Back pain   . Depression   . Joint pain   . Migraine   . Obesity   . Pain    Toes on bilateral feet  . Polycystic ovarian disease   . Sleep apnea     Past Surgical History:  Procedure Laterality Date  . CESAREAN SECTION    . MINISCUS REPAIR  2003 and 2005  . SIMPLE MASTECTOMY WITH AXILLARY SENTINEL NODE BIOPSY Right 12/05/2017   Procedure: SIMPLE MASTECTOMY WITH WIRE LOCALIZATION AND  TARGETED RIGHT AXILLARY SENTINEL NODE MAPPING  AND BIOPSY ERAS PATHWAY;  Surgeon: Erroll Luna, MD;  Location: [facility];  Service: General;  Laterality: Right;  PECTORAL BLOCK  . TONSILLECTOMY  1996    Family Psychiatric History: Please see initial evaluation for full details. I have reviewed the history. No updates at this time.     Family History:  Family History  Problem Relation Age of Onset  . Emphysema Father        smoker  . Allergies Father   . Hyperlipidemia Father   . Hypertension Father   .  Alcohol abuse Father   . Allergies Mother   . Hypertension Mother   . Rheum arthritis Mother   . Osteoporosis Mother   . Skin cancer Mother   . Breast cancer Other   . Heart attack Paternal Grandfather 96  . Colon cancer Paternal Aunt        dx 50's/60's  . Pancreatic cancer Maternal Grandmother 59  . Colon cancer Paternal Uncle 31  . Skin cancer Paternal Grandmother   . Breast cancer Cousin        dx 30's/40's  . Lung cancer Paternal Uncle     Social History:  Social History   Socioeconomic History  . Marital status: Single    Spouse name: Not on file  . Number of children: 2  . Years of education: Bachelors  . Highest education level: Not on file  Occupational History  .  Occupation: [employer] - [employer]  Tobacco Use  . Smoking status: Former Smoker    Packs/day: 0.30    Years: 2.00    Pack years: 0.60    Types: Cigarettes    Quit date: 09/03/1992    Years since quitting: 27.3  . Smokeless tobacco: Never Used  Substance and Sexual Activity  . Alcohol use: Yes    Alcohol/week: 0.0 standard drinks  . Drug use: No  . Sexual activity: Not on file  Other Topics Concern  . Not on file  Social History Narrative   Lives at home with her two children (twins).   Right-handed.   2 cups caffeine per day.   Social Determinants of Health   Financial Resource Strain:   . Difficulty of Paying Living Expenses:   Food Insecurity:   . Worried About Charity fundraiser in the Last Year:   . Arboriculturist in the Last Year:   Transportation Needs:   . Film/video editor (Medical):   Marland Kitchen Lack of Transportation (Non-Medical):   Physical Activity:   . Days of Exercise per Week:   . Minutes of Exercise per Session:   Stress:   . Feeling of Stress :   Social Connections:   . Frequency of Communication with Friends and Family:   . Frequency of Social Gatherings with Friends and Family:   . Attends Religious Services:   . Active Member of Clubs or Organizations:   . Attends Archivist Meetings:   Marland Kitchen Marital Status:     Allergies:  Allergies  Allergen Reactions  . Cleocin [Clindamycin Hcl] Hives  . Ivp Dye [Iodinated Diagnostic Agents] Anaphylaxis  . Latex Hives  . Penicillins Hives  . Shellfish Allergy Anaphylaxis  . Adhesive [Tape]     Rash, itch     Metabolic Disorder Labs: No results found for: HGBA1C, MPG No results found for: PROLACTIN No results found for: CHOL, TRIG, HDL, CHOLHDL, VLDL, LDLCALC Lab Results  Component Value Date   TSH 1.020 10/15/2019   TSH 1.230 02/29/2016    Therapeutic Level Labs: No results found for: LITHIUM No results found for: VALPROATE No components found for:  CBMZ  Current  Medications: Current Outpatient Medications  Medication Sig Dispense Refill  . escitalopram (LEXAPRO) 10 MG tablet Take 1 tablet (10 mg total) by mouth daily. 30 tablet 2  . ibuprofen (ADVIL,MOTRIN) 800 MG tablet Take 800 mg by mouth every 8 (eight) hours as needed.    . ondansetron (ZOFRAN) 4 MG tablet Take 1 tablet (4 mg total) by mouth every 8 (eight) hours  as needed for nausea or vomiting. 20 tablet 1  . rizatriptan (MAXALT-MLT) 10 MG disintegrating tablet Take 1 tablet (10 mg total) by mouth as needed. May repeat in 2 hours if needed 15 tablet 11  . tamoxifen (NOLVADEX) 10 MG tablet Take 1 tablet (10 mg total) by mouth daily. 90 tablet 3  . topiramate (TOPAMAX) 50 MG tablet Take 1 tablet (50 mg total) by mouth 2 (two) times daily. 180 tablet 3  . zolmitriptan (ZOMIG-ZMT) 5 MG disintegrating tablet Take 5 mg by mouth as needed.      No current facility-administered medications for this visit.     Musculoskeletal: Strength & Muscle Tone: N/A Gait & Station: N/A Patient leans: N/A  Psychiatric Specialty Exam: Review of Systems  Psychiatric/Behavioral: Negative for agitation, behavioral problems, confusion, decreased concentration, dysphoric mood, hallucinations, self-injury, sleep disturbance and suicidal ideas. The patient is not nervous/anxious and is not hyperactive.   All other systems reviewed and are negative.   There were no vitals taken for this visit.There is no height or weight on file to calculate BMI.  General Appearance: Fairly Groomed  Eye Contact:  Good  Speech:  Clear and Coherent  Volume:  Normal  Mood:  "great"  Affect:  Appropriate, Congruent and Full Range  Thought Process:  Coherent  Orientation:  Full (Time, Place, and Person)  Thought Content: Logical   Suicidal Thoughts:  No  Homicidal Thoughts:  No  Memory:  Immediate;   Good  Judgement:  Good  Insight:  Good  Psychomotor Activity:  Normal  Concentration:  Concentration: Good and Attention Span:  Good  Recall:  Good  Fund of Knowledge: Good  Language: Good  Akathisia:  No  Handed:  Right  AIMS (if indicated): not done  Assets:  Communication Skills Desire for Improvement  ADL's:  Intact  Cognition: WNL  Sleep:  Good   Screenings:   Assessment and Plan:  Erica Higgins is a 49 y.o. year old female with a history of depression,right breast cancer, treated with mastectomy, radiation , who presents for follow up appointment for MDD (major depressive disorder), recurrent, in partial remission ([place])  # MDD in partial remission, recurrent without psychotic features Has been significant improvement in depressive symptoms since switching from fluoxetine to Lexapro (medication was switched to avoid interaction with tamoxifen).  Will continue current dose to target depression.  Coached behavioral activation.    Plan 1. Continue lexapro 10 mg daily  2. Next appointment- 8/2 at 3:30 for 20 minis, video  The patient demonstrates the following risk factors for suicide: Chronic risk factors for suicide include: psychiatric disorder of depression. Acute risk factors for suicide include: N/A. Protective factors for this patient include: positive social support, responsibility to others (children, family), coping skills and hope for the future. Considering these factors, the overall suicide risk at this point appears to be low. Patient is appropriate for outpatient follow up.   Norman Clay, MD 01/14/2020, 3:52 PM

## 2020-01-12 NOTE — Progress Notes (Signed)
I have reviewed and agreed above plan. 

## 2020-01-14 ENCOUNTER — Encounter (HOSPITAL_COMMUNITY): Payer: Self-pay | Admitting: Psychiatry

## 2020-01-14 ENCOUNTER — Other Ambulatory Visit: Payer: Self-pay

## 2020-01-14 ENCOUNTER — Telehealth (INDEPENDENT_AMBULATORY_CARE_PROVIDER_SITE_OTHER): Admitting: Psychiatry

## 2020-01-14 DIAGNOSIS — F3341 Major depressive disorder, recurrent, in partial remission: Secondary | ICD-10-CM | POA: Diagnosis not present

## 2020-01-14 MED ORDER — ESCITALOPRAM OXALATE 10 MG PO TABS: 10.0000 mg | ORAL_TABLET | Freq: Every day | ORAL | 2 refills | Status: DC

## 2020-01-14 NOTE — Patient Instructions (Signed)
1. Continue lexapro 10 mg daily  2. Next appointment- 8/2 at 3:30

## 2020-01-27 ENCOUNTER — Ambulatory Visit
Admission: RE | Admit: 2020-01-27 | Discharge: 2020-01-27 | Disposition: A | Discharge status: Home / Self Care | Source: Ambulatory Visit | Attending: Pulmonary Disease | Admitting: Pulmonary Disease

## 2020-01-27 ENCOUNTER — Other Ambulatory Visit: Payer: Self-pay

## 2020-01-27 DIAGNOSIS — R918 Other nonspecific abnormal finding of lung field: Secondary | ICD-10-CM

## 2020-03-03 IMAGING — MR MR BILATERAL BREAST WITHOUT AND WITH CONTRAST
12 of 15 series · 31 of 48 positions shown · IV contrast (multihance)
Comparison: Previous exam(s).

CLINICAL DATA: Recent diagnosis of right breast cancer.

LABS:  None.
EXAM:
BILATERAL BREAST MRI WITH AND WITHOUT CONTRAST
TECHNIQUE: Multiplanar, multisequence MR images of both breasts were obtained
prior to and following the intravenous administration of 20 ml of
MultiHance.

[Series 4: fl3d pre-cm no · axial · non-contrast · 1.2mm · 1.04mm/px · z∈[-84,+107]mm · 3 of 160 slices shown]
[im 1/160]
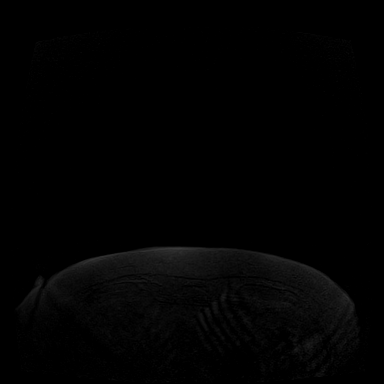
[im 80/160]
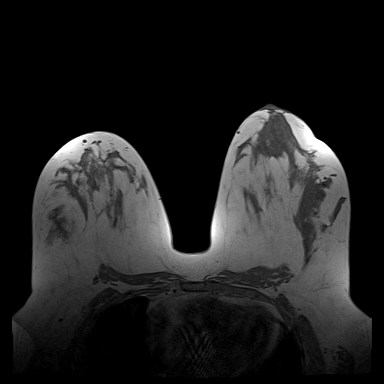
[im 160/160]
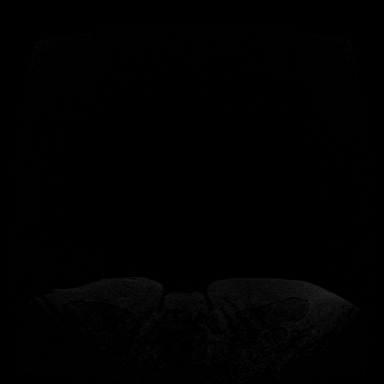

[Series 5: t2_tirm_tra ipat (a-p) · axial · 3.0mm · 0.78mm/px · 1 of 60 slices shown]
[im 1/60]
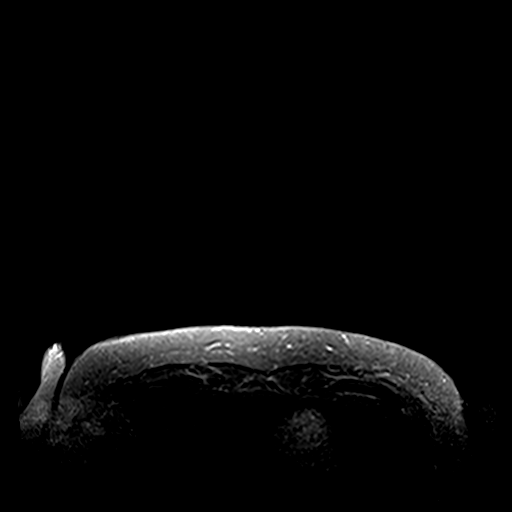

[Series 6: fl3d pre-cm · axial · non-contrast · 1.2mm · 1.04mm/px · z∈[-84,+107]mm · 3 of 160 slices shown]
[im 1/160]
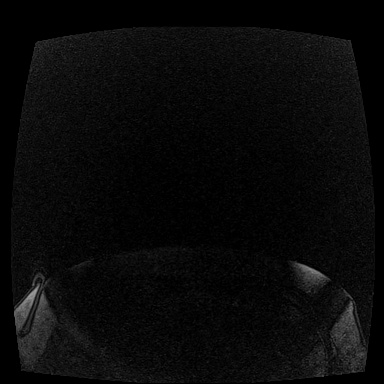
[im 80/160]
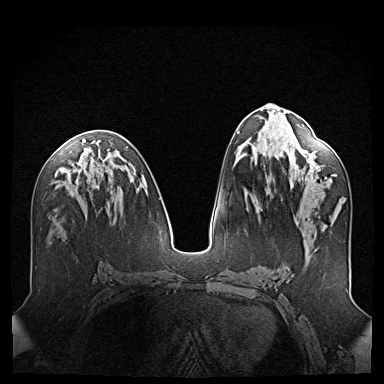
[im 160/160]
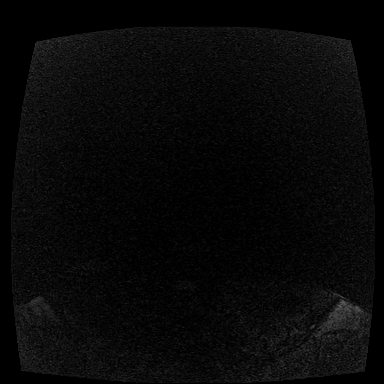

[Series 7: fl3d post-cm 20 · axial · 1.2mm · 1.04mm/px · z∈[-84,+107]mm · 3 of 160 slices shown (1 of 3)]
[im 1/160]
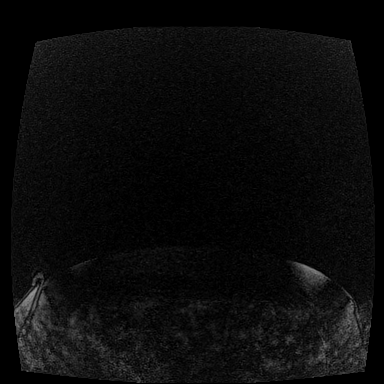
[im 80/160]
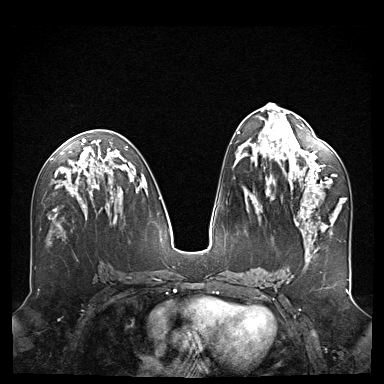
[im 160/160]
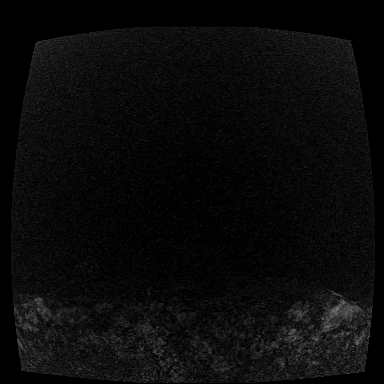

[Series 8: fl3d post-cm 20 · axial · 1.2mm · 1.04mm/px · z∈[-84,+107]mm · 3 of 160 slices shown (2 of 3)]
[im 1/160]
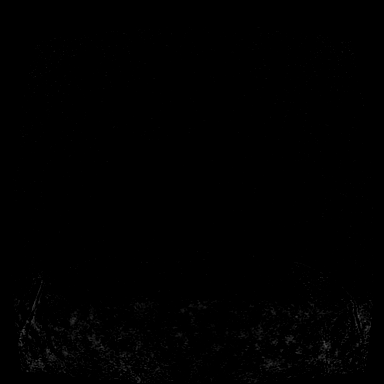
[im 80/160]
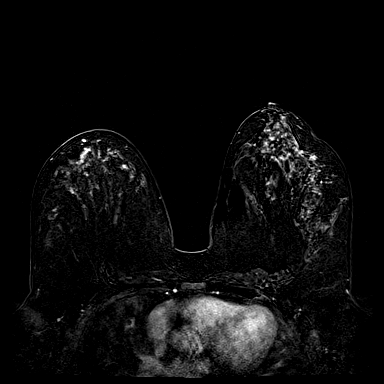
[im 160/160]
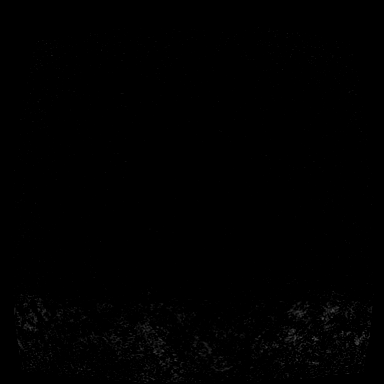

[Series 9: fl3d post-cm 20 · axial · 192.0mm · 1.04mm/px · 1 of 1 slices shown (3 of 3)]
[im 1/1]
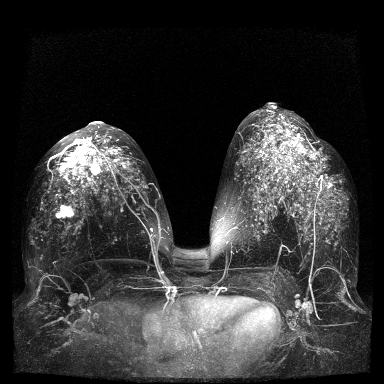

[Series 10: fl3d post-cm 3min · axial · 1.2mm · 1.04mm/px · z∈[-84,+107]mm · 3 of 160 slices shown]
[im 1/160]
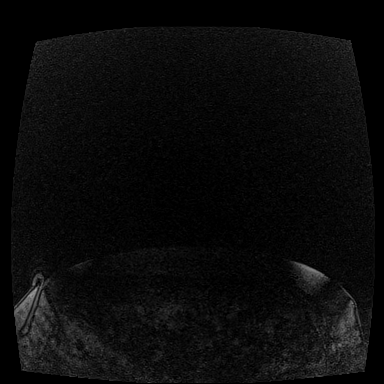
[im 80/160]
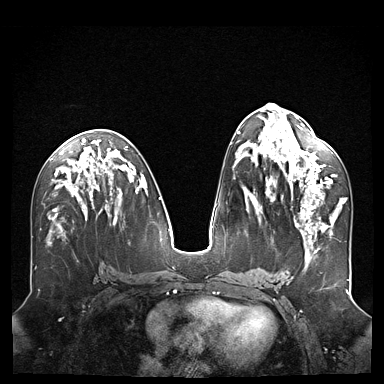
[im 160/160]
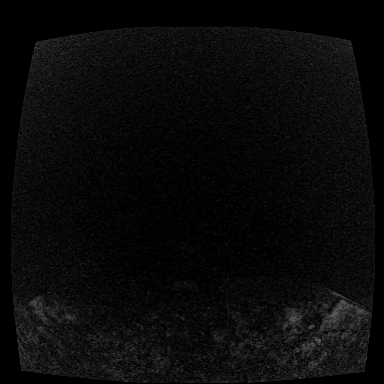

[Series 11: fl3d post-cm 3min_sub · axial · 1.2mm · 1.04mm/px · z∈[-84,+107]mm · 4 of 160 slices shown]
[im 1/160]
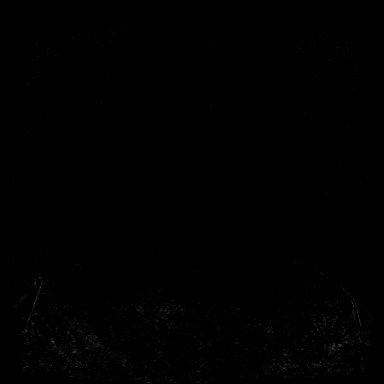
[im 54/160]
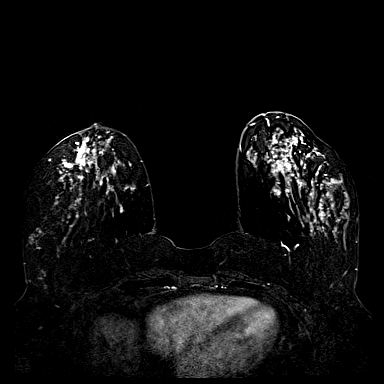
[im 107/160]
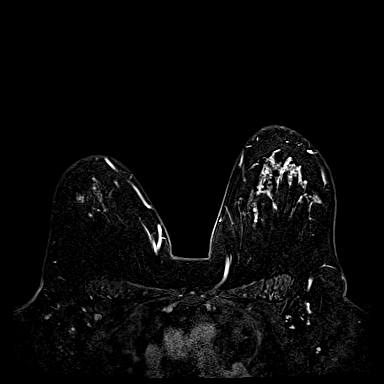
[im 160/160]
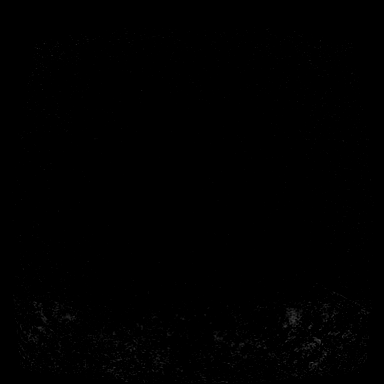

[Series 12: fl3d post-cm 3min_sub_mip_tra · axial · 192.0mm · 1.04mm/px · 1 of 1 slices shown]
[im 1/1]
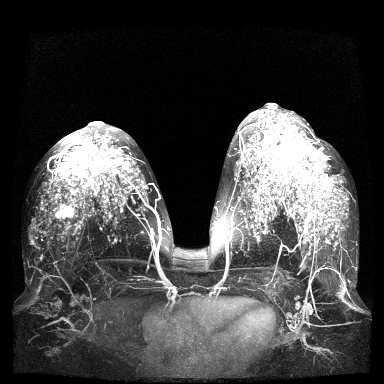

[Series 13: fl3d post-cm 5min · axial · 1.2mm · 1.04mm/px · z∈[-84,+107]mm · 4 of 160 slices shown]
[im 1/160]
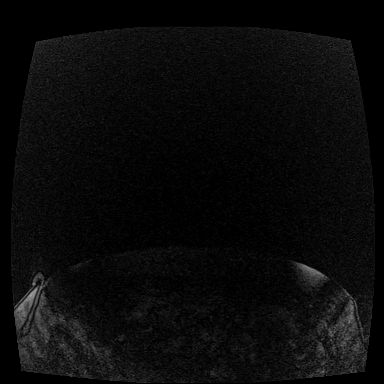
[im 54/160]
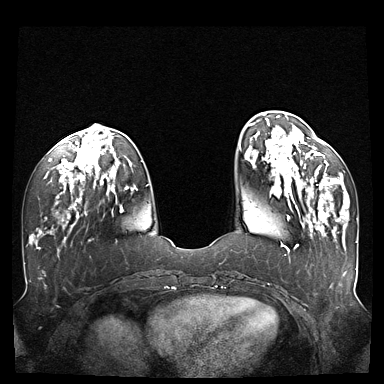
[im 107/160]
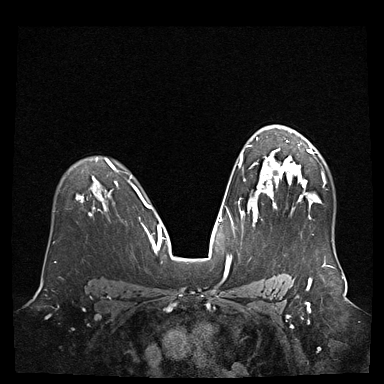
[im 160/160]
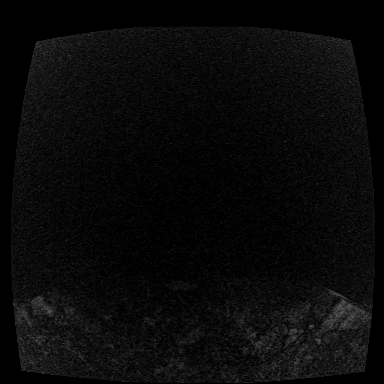

[Series 14: fl3d post-cm 5min_sub · axial · 1.2mm · 1.04mm/px · z∈[-84,+107]mm · 4 of 160 slices shown]
[im 1/160]
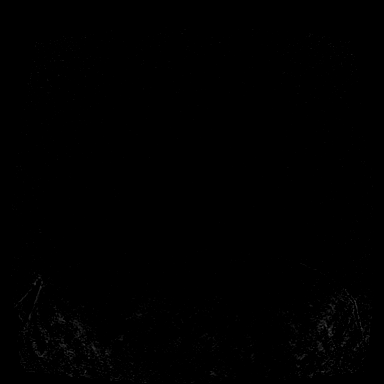
[im 54/160]
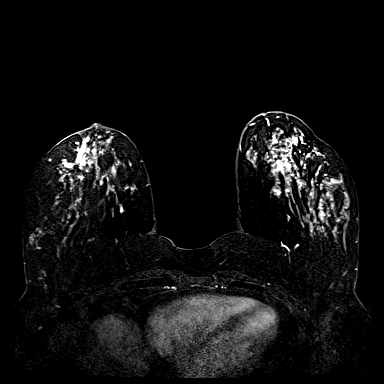
[im 107/160]
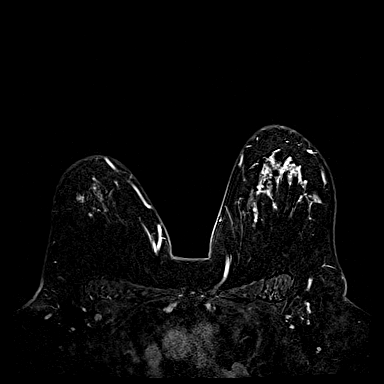
[im 160/160]
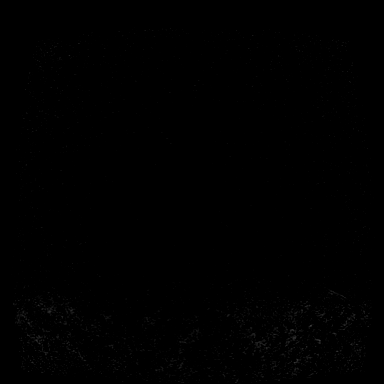

[Series 15: fl3d post-cm 5min_sub_mip_tra · axial · 192.0mm · 1.04mm/px · 1 of 1 slices shown]
[im 1/1]
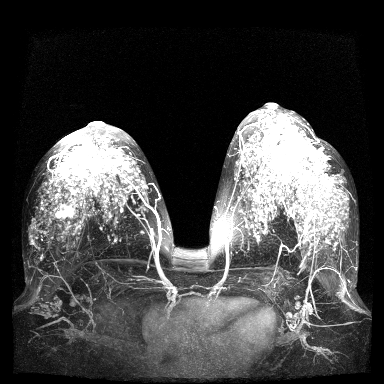

[31 of 48 positions shown; findings below may reference images not displayed]

THREE-DIMENSIONAL MR IMAGE RENDERING ON INDEPENDENT WORKSTATION:

Three-dimensional MR images were rendered by post-processing of the
original MR data on an independent workstation. The
three-dimensional MR images were interpreted, and findings are
reported in the following complete MRI report for this study. Three
dimensional images were evaluated at the independent [employer]
workstation
FINDINGS: Breast composition: c. Heterogeneous fibroglandular tissue.

Background parenchymal enhancement: Marked.

Right breast: The biopsy-proven malignancy in the right breast lower
outer quadrant measures 1.6 x 2.0 x 1.1 cm and presents as an avidly
enhancing irregular mass. Additionally, there is a second lobulated
predominantly progressively enhancing mass in the right breast
slightly lower inner quadrant at approximately 4 o'clock 3 cm
posterior to the nipple, which measures 1.5 by 1.1 by 1.1 cm,
suspicious for a second site of malignancy. Additionally, there is a
third mass with mixed, predominantly progressive kinetics of
enhancement in the right breast slightly lower outer quadrant at
approximately 8:30 o'clock, anterior depth, approximately 2 cm
posterior to the nipple, which measures 1.3 by 1.1 by 1.2 cm, also
suspicious for malignancy. There is a wider area of clumped non mass
enhancement in the anterior right breast bridging the 2 described
masses at 4 and 8:30 o'clock and further involving the most superior
breast in the anterior upper outer quadrant. The size of this area
is difficult to measure accurately due to its non mass nature but it
involves at least 5 by 4 cm of breast parenchyma.

Left breast: No mass or abnormal enhancement.

Lymph nodes: 1 indeterminate appearing lymph nodes in the low right
axilla measures 1.4 cm in long-axis.

Ancillary findings:  None.
IMPRESSION: Biopsy-proven malignancy in the right breast lower outer quadrant,
labeled 8 o'clock, on previous imaging, measures 1.6 cm in greatest
dimension.

Two other suspicious masses in the right breast, at 4 o'clock and
8:30 o'clock.

Area of suspicious clumped non mass enhancement in the anterior
right breast, involving the breast parenchyma from 4-10 o'clock
approximately.

One indeterminate right axillary lymph node.

No MRI evidence of malignancy in the left breast.

RECOMMENDATION:
Second-look ultrasound of the right breast and right axilla.

If second-look ultrasound is unrevealing, and the biopsy results
will make a difference in patient's clinical management, then MRI
guided core needle biopsy of the right breast masses at 4 o'clock,
8:30 o'clock and the area of anterior non mass enhancement, is
recommended.

BI-RADS CATEGORY  4: Suspicious.

## 2020-03-08 IMAGING — US US BREAST BX W LOC DEV 1ST LESION IMG BX SPEC US GUIDE*R*
1 series · 12 of 25 positions shown · non-contrast
Comparison: Previous exam(s).

ADDENDUM:
Pathology revealed FIBROCYSTIC CHANGE, INCLUDING SCLEROSING ADENOSIS
WITH APOCRINE FEATURES of the Right breast, 4 o'clock to 5 o'clock,
1 cmfn. This was found to be concordant by Dr. Milad Hack.

Pathology revealed GRADE II INVASIVE DUCTAL CARCINOMA, PERINEURAL
INVASION IS IDENTIFIED of the Right breast, 8:30 o'clock, 1 cmfn.
METASTATIC BREAST CARCINOMA TO THE Right axillary lymph node. This
was found to be concordant by Dr. Milad Hack.
Pathology results were discussed with the patient by telephone. The
patient reported doing well after the biopsies with tenderness at
the sites. Post biopsy instructions and care were reviewed and
questions were answered. The patient was encouraged to call The
The patient has a recent diagnosis of right breast cancer and should
follow her outlined treatment plan. The patient is scheduled to see
Dr. Hagen Jean-Pierre at [REDACTED] on December 02, 2017 to
discuss her treatment plan.
Pathology results reported by Kankkunen Von Alfthan, RN on 12/02/2017.
CLINICAL DATA: Patient with known recent diagnosis of right breast
cancer outer lower quadrant. Recent MRI demonstrates suspicious
masses over the 4 o'clock and 830 positions of the right breast with
non mass enhancement between these 2 locations as well as
indeterminate right axillary lymph node. Second look ultrasound
demonstrates irregular masses at the 4-5 o'clock position and [DATE]
position of the right breast as well as an abnormal right axillary
lymph node as these 3 sites will be biopsied.
EXAM:
ULTRASOUND GUIDED RIGHT BREAST CORE NEEDLE BIOPSY

[Series 1: us breast bx w loc dev 1st lesion img bx spec us g · 0.07mm/px · 12 of 35 slices shown]
[im 2/35]
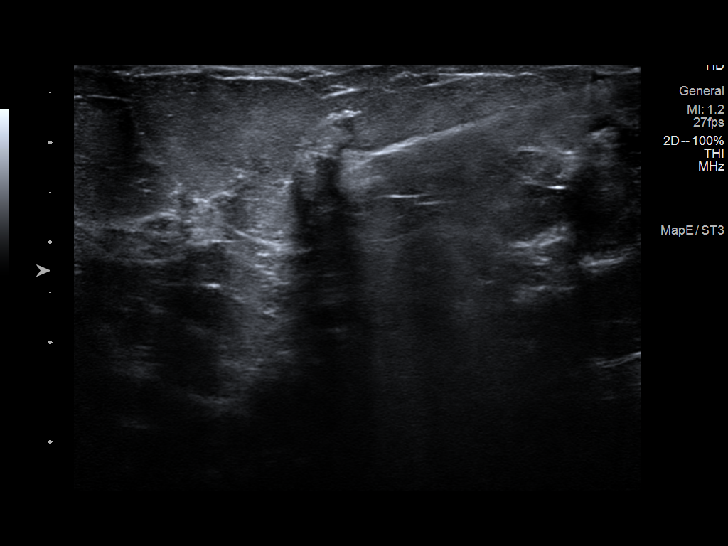
[im 5/35]
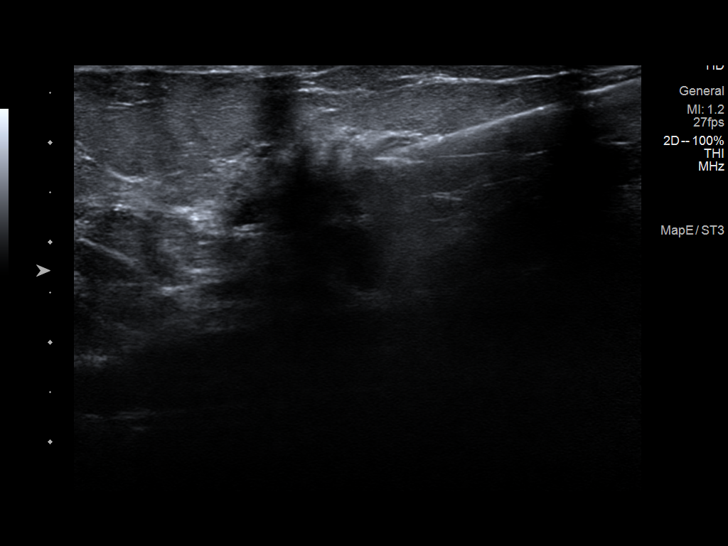
[im 8/35]
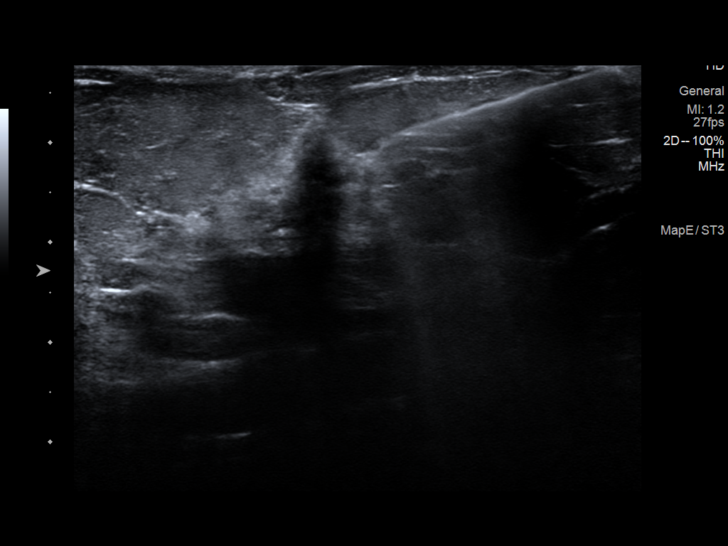
[im 10/35]
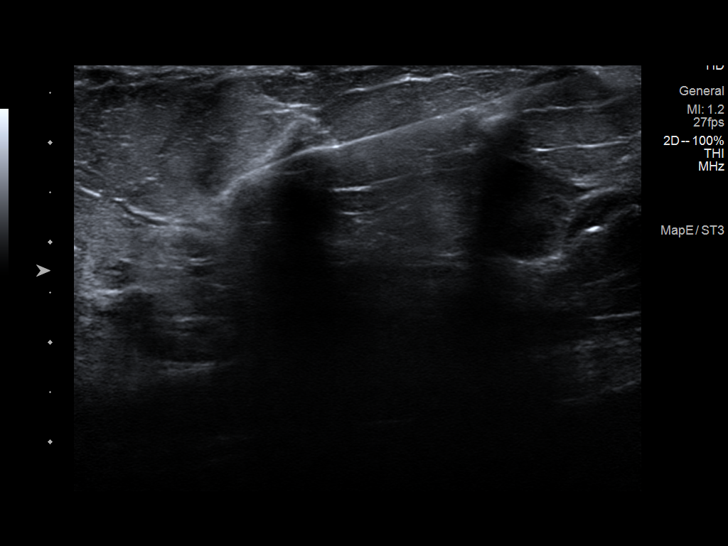
[im 13/35]
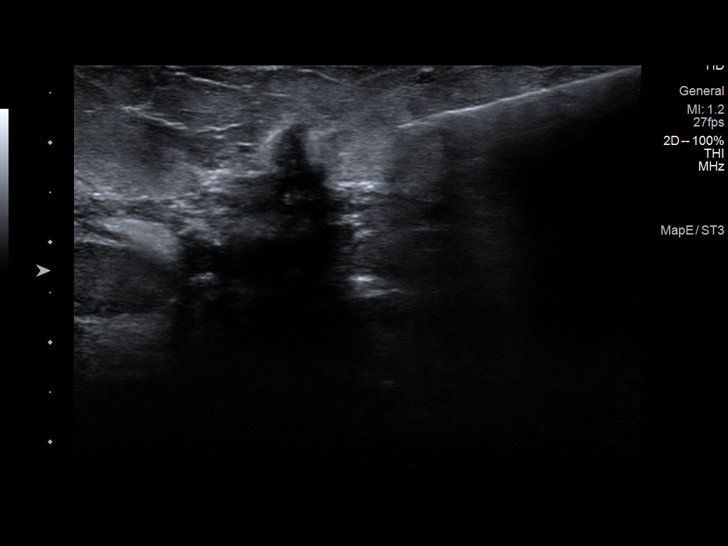
[im 16/35]
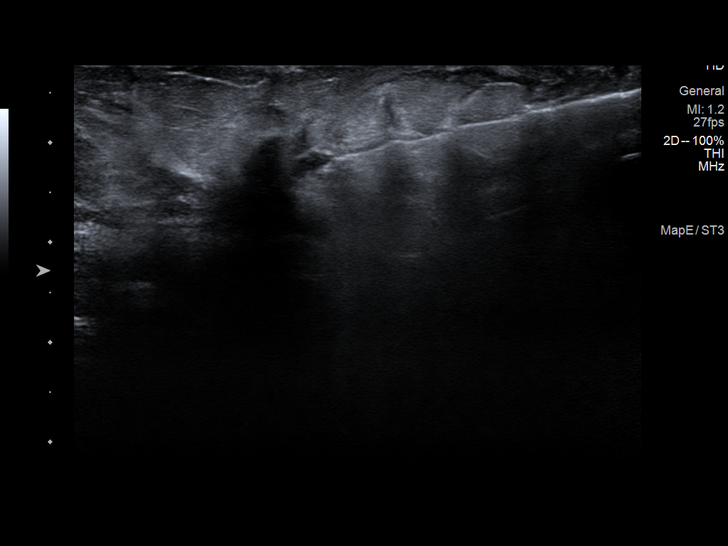
[im 19/35]
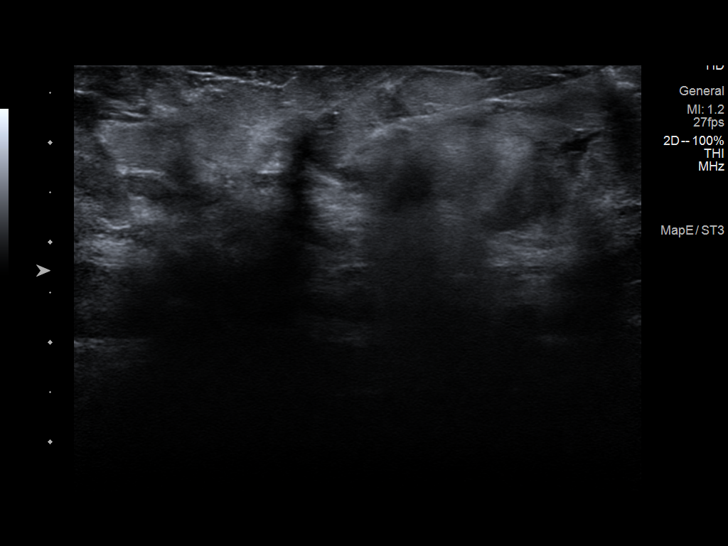
[im 22/35]
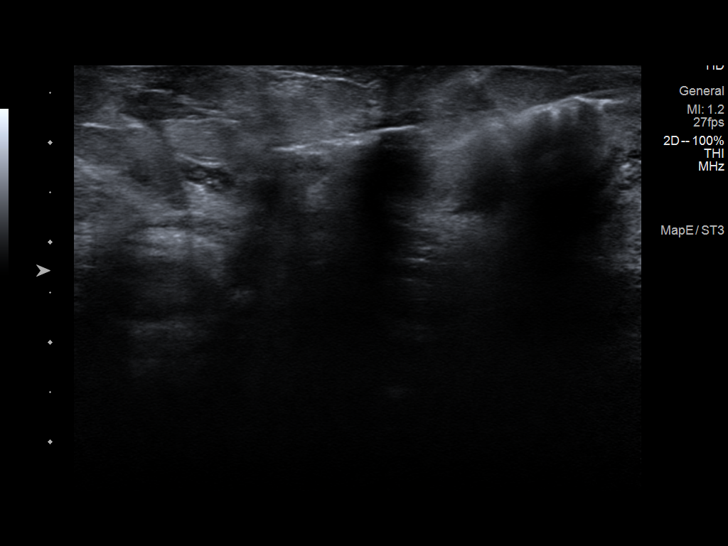
[im 25/35]
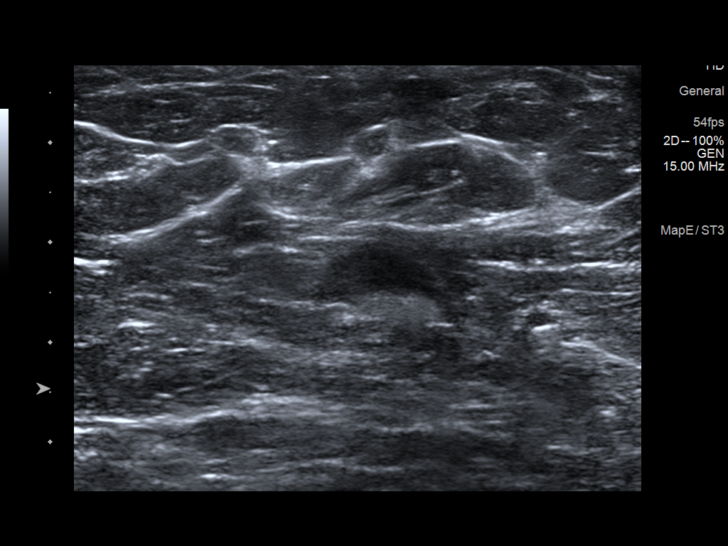
[im 27/35]
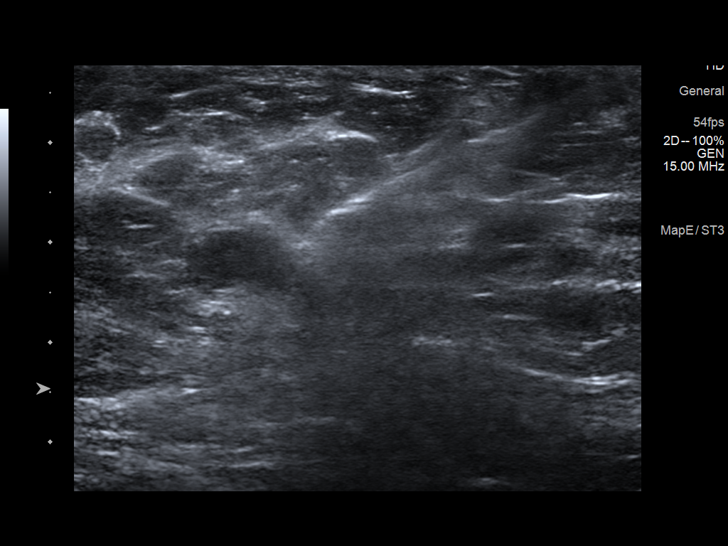
[im 30/35]
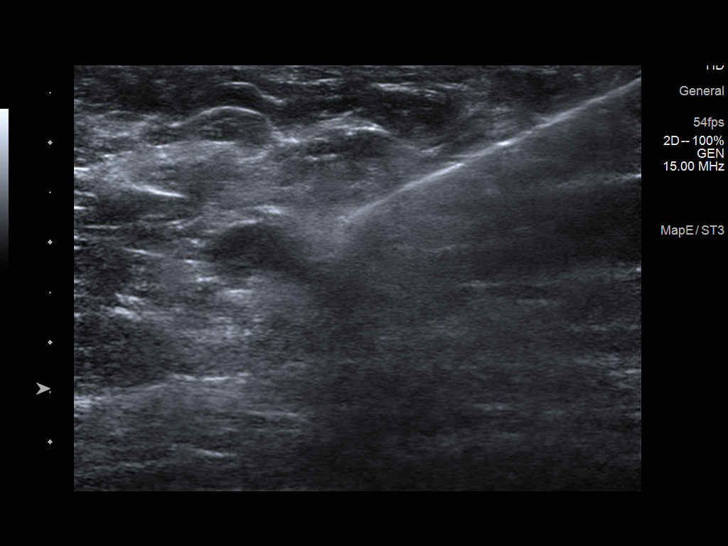
[im 33/35]
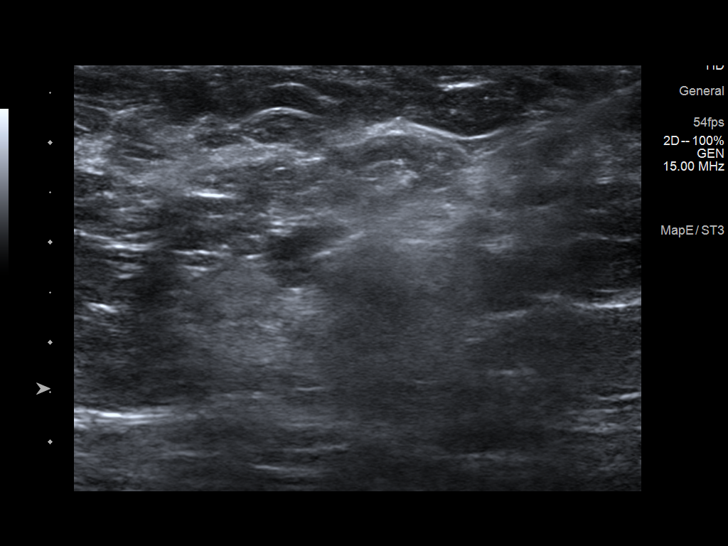

[12 of 25 positions shown; findings below may reference images not displayed]



Lesion quadrant: Right inner lower quadrant.

Using sterile technique and 1% Lidocaine as local anesthetic, under
direct ultrasound visualization, a 12 gauge Neuvirt device was
used to perform biopsy of the targeted mass at the 4-5 o'clock
position 1 cm from the nipple using a inferior to superior approach.
At the conclusion of the procedure a coil shaped tissue marker clip
was deployed into the biopsy cavity. Follow up 2 view mammogram was
performed and dictated separately.

Lesion quadrant: Right outer lower quadrant.

Using sterile technique and 1% Lidocaine as local anesthetic, under
direct ultrasound visualization, a 12 gauge Neuvirt device was
used to perform biopsy of the targeted mass at the [DATE] position 1
cm from the nipple using a inferior to superior approach. At the
conclusion of the procedure a heart shaped tissue marker clip was
deployed into the biopsy cavity. Follow up 2 view mammogram was
performed and dictated separately.

Using sterile technique and 1% Lidocaine as local anesthetic, under
direct ultrasound visualization, a 14 gauge Neuvirt device was
used to perform biopsy of the abnormal right axillary lymph node
using a inferior to superior approach. At the conclusion of the
procedure a HydroMARK tissue marker clip was deployed into the
biopsy cavity. Follow up 2 view mammogram was performed and dictated
separately.
IMPRESSION: Ultrasound guided biopsy of suspicious masses at the 4-5 o'clock
position and [DATE] positions of the right breast as well as an
abnormal right axillary lymph node. No apparent complications.

## 2020-03-15 IMAGING — US US NEEDLE LOCALIZATION*L*
1 series · 3 of 3 positions shown · non-contrast
Comparison: Previous exams.

CLINICAL DATA: Recent diagnosis of right breast cancer with
biopsy-proven metastatic right axillary lymph node. Patient presents
for wire localization for biopsy-proven metastatic right axillary
lymph node.

EXAM:
NEEDLE LOCALIZATION OF THE RIGHT BREAST WITH ULTRASOUND GUIDANCE

[Series 2: us needle localization*left* · 0.07mm/px · 3 of 3 slices shown]
[im 1/3]
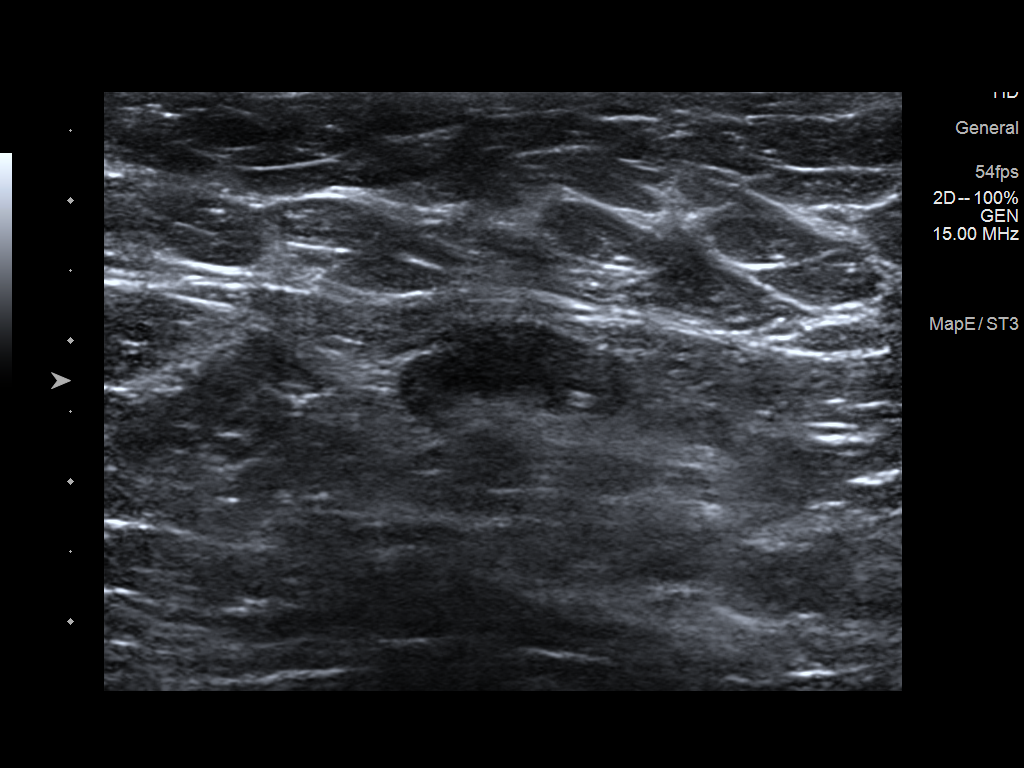
[im 2/3]
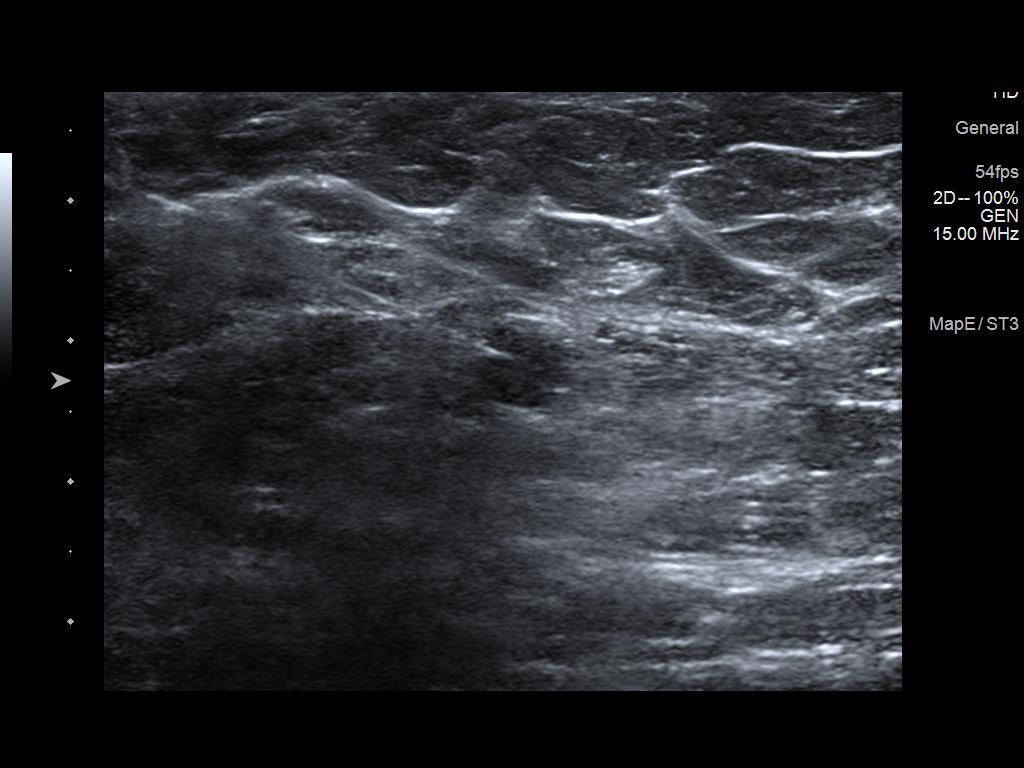
[im 3/3]
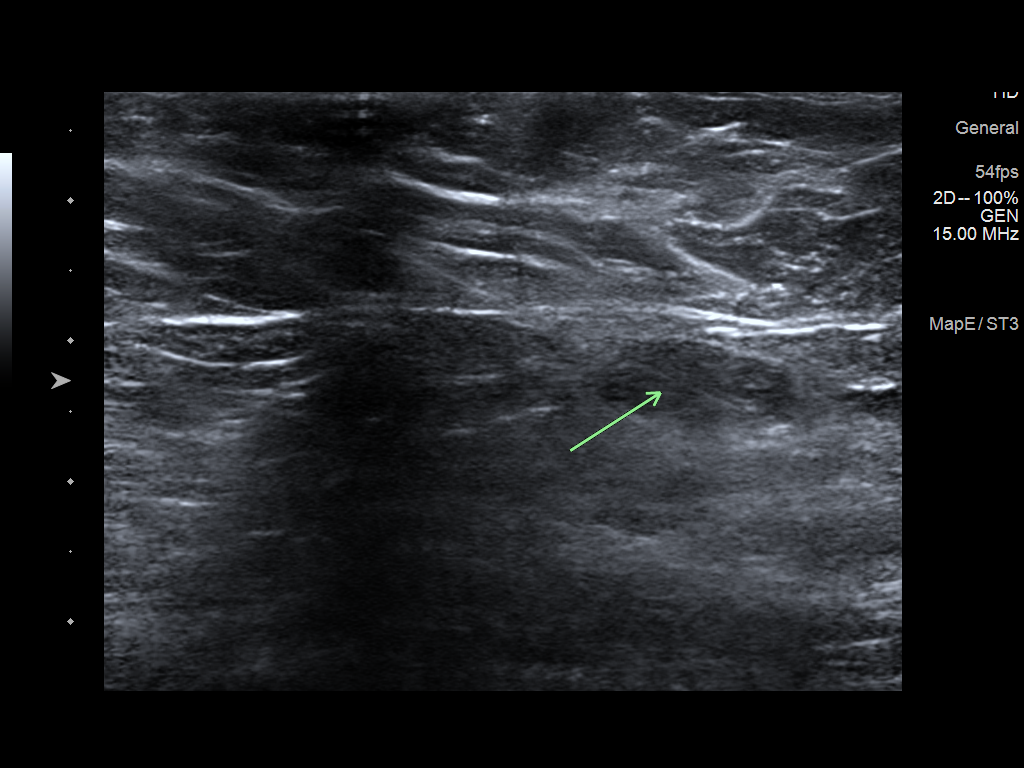

[3 of 3 positions shown; findings below may reference images not displayed]

FINDINGS: Patient presents for needle localization prior to right breast
surgery. I met with the patient and we discussed the procedure of
needle localization including benefits and alternatives. We
discussed the high likelihood of a successful procedure. We
discussed the risks of the procedure, including infection, bleeding,
tissue injury, and further surgery. Informed, written consent was
given. The usual time-out protocol was performed immediately prior
to the procedure.

Using ultrasound guidance, sterile technique, 1% lidocaine and a 9
cm modified Kopans needle, abnormal right axillary lymph node with
associated biopsy clip artifact was localized using lateral
approach. The images were marked for Dr. Dansali.
IMPRESSION: Needle localization previously biopsy right axillary lymph node. No
apparent complications.

## 2020-03-15 IMAGING — MG MM BREAST LOCALIZATION CLIP
1 series · 1 of 1 positions shown · non-contrast
Comparison: Previous exam(s).

CLINICAL DATA: Status post ultrasound-guided needle localization of
metastatic right axillary lymph node.

EXAM:
DIAGNOSTIC RIGHT MAMMOGRAM POST ULTRASOUND RIGHT AXILLARY LYMPH NODE
LOCALIZATION

[R MLO]
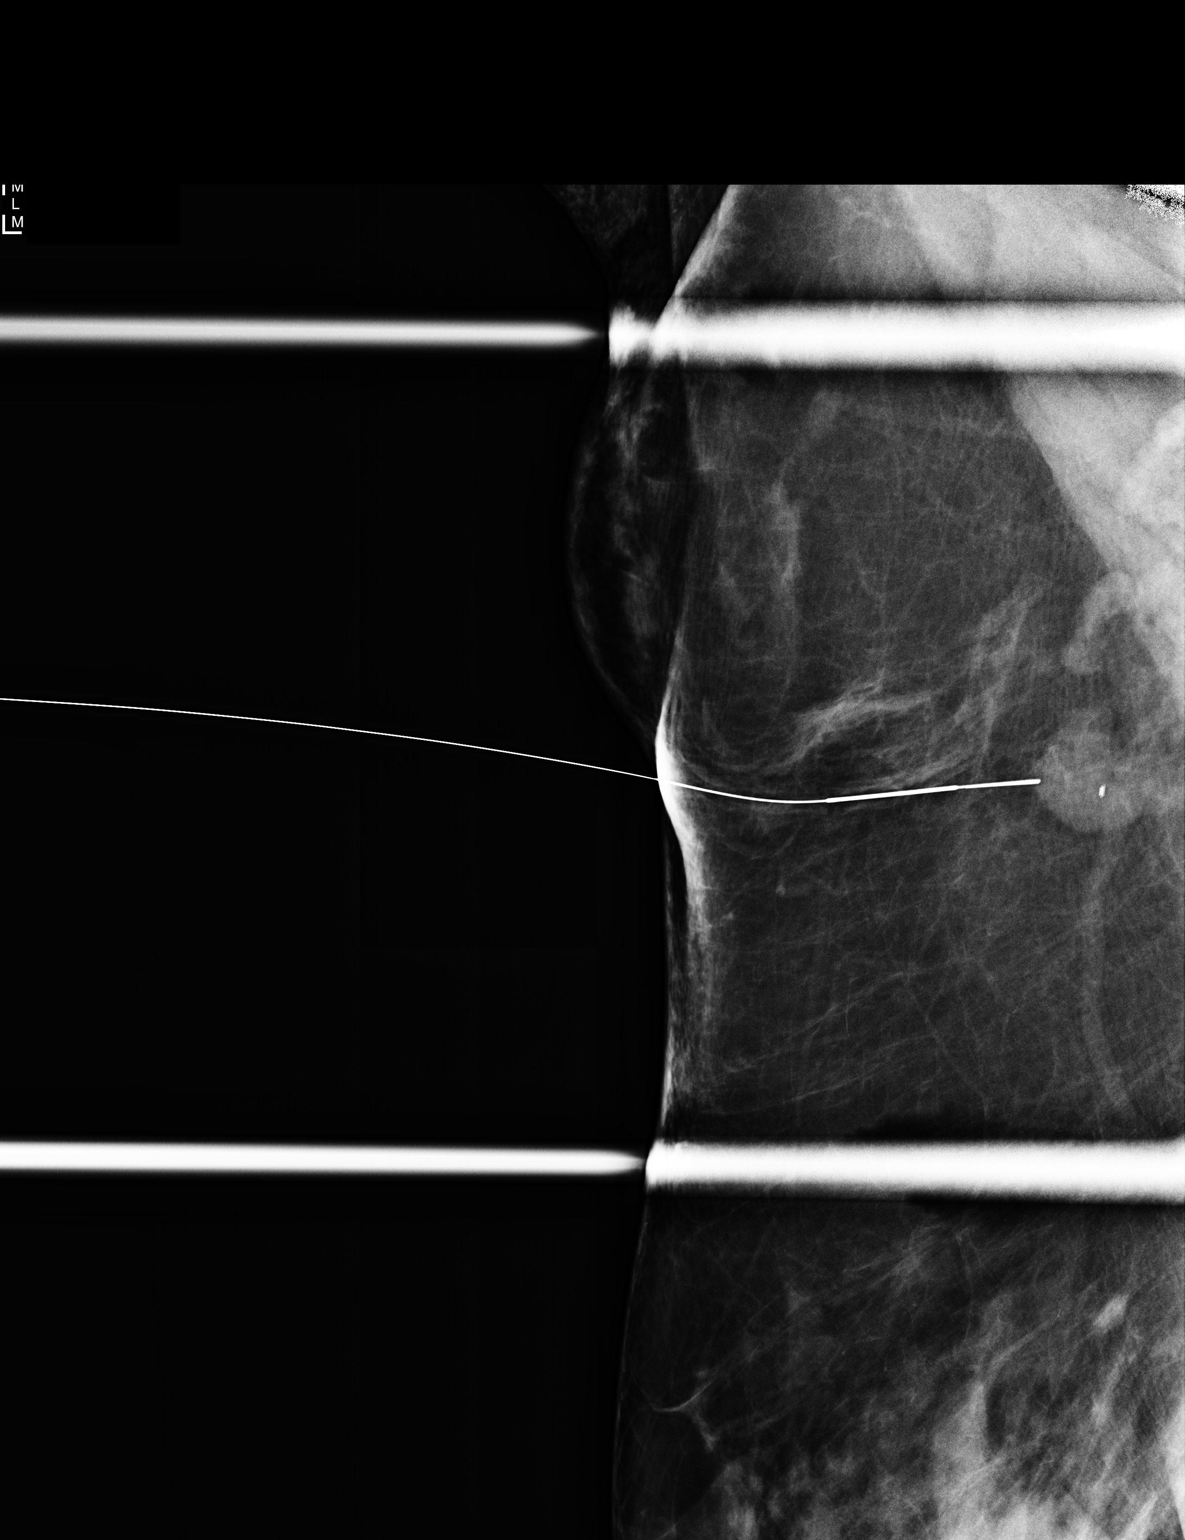

[1 of 1 positions shown; findings below may reference images not displayed]

FINDINGS: Mammographic images were obtained following right breast ultrasound
guided needle localization of metastatic right axillary lymph node
previously biopsy. Spot compression MLO views of the right breast
demonstrate wire in the anterior aspect of the biopsy lymph node.
IMPRESSION: Post wire localization of right axillary lymph node as described.

Final Assessment: Post Procedure Mammograms for wire placement

## 2020-03-29 NOTE — Progress Notes (Signed)
Virtual Visit via Video Note  I connected with Erica Higgins on 04/04/20 at  3:30 PM EDT by a video enabled telemedicine application and verified that I am speaking with the correct person using two identifiers.   I discussed the limitations of evaluation and management by telemedicine and the availability of in person appointments. The patient expressed understanding and agreed to proceed.    I discussed the assessment and treatment plan with the patient. The patient was provided an opportunity to ask questions and all were answered. The patient agreed with the plan and demonstrated an understanding of the instructions.   The patient was advised to call back or seek an in-person evaluation if the symptoms worsen or if the condition fails to improve as anticipated.  Location: patient- work, provider- office   I provided 10 minutes of non-face-to-face time during this encounter.   Norman Clay, MD    [facility] MD/PA/NP OP Progress Note  04/04/2020 3:47 PM Erica Higgins  MRN:  [id number]  Chief Complaint:  Chief Complaint    Follow-up; Depression; Anxiety     HPI:  This is a follow-up appointment for depression.  She states that she has been doing very well.  She has been stressed at work at times, although she has been managing things well.  She enjoys hanging out with her children.  She usually brings them to her parents when she is at work as they are not currently going to school.  She enjoyed the time with her cousin, who is visiting from [place].  She denies insomnia. She denies feeling depressed. She has good energy and motivation.  She has good appetite.  She has good concentration.  She denies SI.  She denies panic attacks.  She takes a walk at least twice a week, and using stationary bike.  She denies any concern of medication side effect.   240 lbs Wt Readings from Last 3 Encounters:  12/28/19 239 lb 6.4 oz (108.6 kg)  12/22/19 238 lb (108 kg)  10/15/19 256 lb 8 oz (116.3 kg)     Visit Diagnosis:    ICD-10-CM   1. MDD (major depressive disorder), recurrent, in full remission ([place])  F33.42     Past Psychiatric History: Please see initial evaluation for full details. I have reviewed the history. No updates at this time.     Past Medical History:  Past Medical History:  Diagnosis Date   Abdominal pain, acute, right upper quadrant    Anxiety    Back pain    Depression    Joint pain    Migraine    Obesity    Pain    Toes on bilateral feet   Polycystic ovarian disease    Sleep apnea     Past Surgical History:  Procedure Laterality Date   CESAREAN SECTION     MINISCUS REPAIR  2003 and 2005   SIMPLE MASTECTOMY WITH AXILLARY SENTINEL NODE BIOPSY Right 12/05/2017   Procedure: SIMPLE MASTECTOMY WITH WIRE LOCALIZATION AND  TARGETED RIGHT AXILLARY SENTINEL NODE MAPPING  AND BIOPSY ERAS PATHWAY;  Surgeon: Erroll Luna, MD;  Location: [facility];  Service: General;  Laterality: Right;  [place]    Family Psychiatric History: Please see initial evaluation for full details. I have reviewed the history. No updates at this time.     Family History:  Family History  Problem Relation Age of Onset   Emphysema Father  smoker   Allergies Father    Hyperlipidemia Father    Hypertension Father    Alcohol abuse Father    Allergies Mother    Hypertension Mother    Rheum arthritis Mother    Osteoporosis Mother    Skin cancer Mother    Breast cancer Other    Heart attack Paternal Grandfather 43   Colon cancer Paternal Aunt        dx 64's/60's   Pancreatic cancer Maternal Grandmother 79   Colon cancer Paternal Uncle 14   Skin cancer Paternal Grandmother    Breast cancer Cousin        dx 57's/40's   Lung cancer Paternal Uncle     Social History:  Social History   Socioeconomic History   Marital status: Single    Spouse name: Not on file   Number of children: 2    Years of education: Bachelors   Highest education level: Not on file  Occupational History   Occupation: Mental Health Tech - [employer]  Tobacco Use   Smoking status: Former Smoker    Packs/day: 0.30    Years: 2.00    Pack years: 0.60    Types: Cigarettes    Quit date: 09/03/1992    Years since quitting: 27.6   Smokeless tobacco: Never Used  Substance and Sexual Activity   Alcohol use: Yes    Alcohol/week: 0.0 standard drinks   Drug use: No   Sexual activity: Not on file  Other Topics Concern   Not on file  Social History Narrative   Lives at home with her two children (twins).   Right-handed.   2 cups caffeine per day.   Social Determinants of Health   Financial Resource Strain:    Difficulty of Paying Living Expenses:   Food Insecurity:    Worried About Charity fundraiser in the Last Year:    Arboriculturist in the Last Year:   Transportation Needs:    Film/video editor (Medical):    Lack of Transportation (Non-Medical):   Physical Activity:    Days of Exercise per Week:    Minutes of Exercise per Session:   Stress:    Feeling of Stress :   Social Connections:    Frequency of Communication with Friends and Family:    Frequency of Social Gatherings with Friends and Family:    Attends Religious Services:    Active Member of Clubs or Organizations:    Attends Archivist Meetings:    Marital Status:     Allergies:  Allergies  Allergen Reactions   Cleocin [Clindamycin Hcl] Hives   Ivp Dye [Iodinated Diagnostic Agents] Anaphylaxis   Latex Hives   Penicillins Hives   Shellfish Allergy Anaphylaxis   Adhesive [Tape]     Rash, itch     Metabolic Disorder Labs: No results found for: HGBA1C, MPG No results found for: PROLACTIN No results found for: CHOL, TRIG, HDL, CHOLHDL, VLDL, LDLCALC Lab Results  Component Value Date   TSH 1.020 10/15/2019   TSH 1.230 02/29/2016    Therapeutic Level Labs: No results found for:  LITHIUM No results found for: VALPROATE No components found for:  CBMZ  Current Medications: Current Outpatient Medications  Medication Sig Dispense Refill   escitalopram (LEXAPRO) 10 MG tablet Take 1 tablet (10 mg total) by mouth daily. 90 tablet 1   ibuprofen (ADVIL,MOTRIN) 800 MG tablet Take 800 mg by mouth every 8 (eight) hours as needed.  ondansetron (ZOFRAN) 4 MG tablet Take 1 tablet (4 mg total) by mouth every 8 (eight) hours as needed for nausea or vomiting. 20 tablet 1   rizatriptan (MAXALT-MLT) 10 MG disintegrating tablet Take 1 tablet (10 mg total) by mouth as needed. May repeat in 2 hours if needed 15 tablet 11   tamoxifen (NOLVADEX) 10 MG tablet Take 1 tablet (10 mg total) by mouth daily. 90 tablet 3   topiramate (TOPAMAX) 50 MG tablet Take 1 tablet (50 mg total) by mouth 2 (two) times daily. 180 tablet 3   zolmitriptan (ZOMIG-ZMT) 5 MG disintegrating tablet Take 5 mg by mouth as needed.      No current facility-administered medications for this visit.     Musculoskeletal: Strength & Muscle Tone: N/A Gait & Station: N/A Patient leans: N/A  Psychiatric Specialty Exam: Review of Systems  Psychiatric/Behavioral: Negative for agitation, behavioral problems, confusion, decreased concentration, dysphoric mood, hallucinations, self-injury, sleep disturbance and suicidal ideas. The patient is nervous/anxious. The patient is not hyperactive.   All other systems reviewed and are negative.   There were no vitals taken for this visit.There is no height or weight on file to calculate BMI.  General Appearance: Fairly Groomed  Eye Contact:  Good  Speech:  Clear and Coherent  Volume:  Normal  Mood:  good  Affect:  Appropriate, Congruent and euthymic  Thought Process:  Coherent  Orientation:  Full (Time, Place, and Person)  Thought Content: Logical   Suicidal Thoughts:  No  Homicidal Thoughts:  No  Memory:  Immediate;   Good  Judgement:  Good  Insight:  Good   Psychomotor Activity:  Normal  Concentration:  Concentration: Good and Attention Span: Good  Recall:  Good  Fund of Knowledge: Good  Language: Good  Akathisia:  No  Handed:  Right  AIMS (if indicated): not done  Assets:  Communication Skills Desire for Improvement  ADL's:  Intact  Cognition: WNL  Sleep:  Good   Screenings:   Assessment and Plan:  Erica Higgins is a 49 y.o. year old female with a history of depression,right breast cancer,treated with mastectomy, radiation , who presents for follow up appointment for below.   1. MDD (major depressive disorder), recurrent, in full remission ([place]) She denies any mood symptoms except self-limited anxiety since the last visit.  We will continue current dose of Lexapro to target depression.  Provided psychoeducation about maintenance treatment; she agrees to stay on the current medication at least for 1 year (until 01/2021).    Plan 1. Continue lexapro 10 mg daily  2. Next appointment-11/22 at 1 PM for 20 mins, video  The patient demonstrates the following risk factors for suicide: Chronic risk factors for suicide include:psychiatric disorder ofdepression. Acute risk factorsfor suicide include: N/A. Protective factorsfor this patient include: positive social support, responsibility to others (children, family), coping skills and hope for the future. Considering these factors, the overall suicide risk at this point appears to below. Patientisappropriate for outpatient follow up.   Norman Clay, MD 04/04/2020, 3:47 PM

## 2020-04-04 ENCOUNTER — Other Ambulatory Visit: Payer: Self-pay

## 2020-04-04 ENCOUNTER — Encounter (HOSPITAL_COMMUNITY): Payer: Self-pay | Admitting: Psychiatry

## 2020-04-04 ENCOUNTER — Telehealth (INDEPENDENT_AMBULATORY_CARE_PROVIDER_SITE_OTHER): Admitting: Psychiatry

## 2020-04-04 DIAGNOSIS — F3342 Major depressive disorder, recurrent, in full remission: Secondary | ICD-10-CM

## 2020-04-04 MED ORDER — ESCITALOPRAM OXALATE 10 MG PO TABS: 10.0000 mg | ORAL_TABLET | Freq: Every day | ORAL | 1 refills | Status: DC

## 2020-04-04 NOTE — Patient Instructions (Signed)
1. Continue lexapro 10 mg daily  2. Next appointment-11/22 at 1 PM

## 2020-06-27 ENCOUNTER — Other Ambulatory Visit: Payer: Self-pay

## 2020-06-27 ENCOUNTER — Encounter: Payer: Self-pay | Admitting: Neurology

## 2020-06-27 ENCOUNTER — Ambulatory Visit: Admitting: Neurology

## 2020-06-27 VITALS — BP 114/72 | Ht 63.0 in | Wt 250.2 lb

## 2020-06-27 DIAGNOSIS — G43109 Migraine with aura, not intractable, without status migrainosus: Secondary | ICD-10-CM | POA: Diagnosis not present

## 2020-06-27 NOTE — Patient Instructions (Signed)
Continue current medications  Let me know about the Topamax prescription 50 or 100 mg tablet  Call for dose adjustment See you back in 1 year

## 2020-06-27 NOTE — Progress Notes (Signed)
PATIENT: Erica Higgins Higgins DOB: 09-07-1970  REASON FOR VISIT: follow up HISTORY FROM: patient  HISTORY OF PRESENT ILLNESS: Today 06/27/20  HISTORY Erica Higgins Higgins a 49 year old female, seen in request byher primary care physician Dr.Shah, Erica Higgins,for evaluation of frequent headaches initial evaluation was on February 11, Higgins.  I have reviewed and summarized the referring note from the referring physician.She had a history of right breast cancer, that is post right lobectomy in Erica Higgins Higgins followed by radiation therapy, she is taking tamoxifen  She reported long history of migraine headaches, typical migraine are right lateralized severe pounding headache with associated light noise sensitivity, nausea vomiting, lasting for few hours to couple days, trigger for her migraine usually stress, weather changes, strong smells such as Lysol.  She used to have migraine headaches about once every couple months, but since December 2020, she has increased migraine headaches, began to have daily moderate headaches since January Higgins, couple times, her migraines were so severe, prolonged, lasting for couple daysdespite she was taking maximized dose of Motrin 3200 mg, Zomig 2.5 mg 4 tablets, Zofran 4 mg 3 tablets in 24 hours  She denies lateralized motor or sensory deficit, sometimes preceding her headache, and during her headaches, she had blurry vision, usually on her left side.  She has been taking Zomig as abortive treatment, take up to 4 hours to take effect, sometimes she has to take second dose, she has never tried other triptans in the past, she is not taking over-the-counter Tylenol or Motrin every other day  Update Erica Higgins Higgins SS: CBC, CMP, TSH were normal.  MRI of the brain showed no significant abnormalities.  Could not tolerate Topamax 100 mg twice a day, due to side effect of sour stomach.  She is now taking Topamax 50 mg twice a day, doing well, headaches much improved.  In the last  over 2 months has only had 2 headaches.  Both times, took Maxalt once with good benefit, along with Zofran, she was able to go to work.  Is overall pleased with medications in progress. Lost almost 20 lbs since starting Topamax.  Update October 25, Higgins SS: [name] remains on Topamax, unsure if taking 50 mg once daily, or 100 mg once daily, regardless, just taking 1 pill.  In the last 6 months, only 2 significant migraines, will take Aleve, and Maxalt with good benefit.  Keep Zomig just in case, but has not had to take. 1-2 mild to moderate migraines, took Aleve with good benefit.  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  Has sleep apnea and CPAP, but does not use it.  REVIEW OF SYSTEMS: Out of a complete 14 system review of symptoms, the patient complains only of the following symptoms, and all other reviewed systems are negative.  Headache  ALLERGIES: Allergies  Allergen Reactions  . Cleocin [Clindamycin Hcl] Hives  . Ivp Dye [Iodinated Diagnostic Agents] Anaphylaxis  . Latex Hives  . Penicillins Hives  . Shellfish Allergy Anaphylaxis  . Adhesive [Tape]     Rash, itch     HOME MEDICATIONS: Outpatient Medications Prior to Visit  Medication Sig Dispense Refill  . escitalopram (LEXAPRO) 10 MG tablet Take 1 tablet (10 mg total) by mouth daily. 90 tablet 1  . ibuprofen (ADVIL,MOTRIN) 800 MG tablet Take 800 mg by mouth every 8 (eight) hours as needed.    . ondansetron (ZOFRAN) 4 MG tablet Take 1 tablet (4 mg  total) by mouth every 8 (eight) hours as needed for nausea or vomiting. 20 tablet 1  . rizatriptan (MAXALT-MLT) 10 MG disintegrating tablet Take 1 tablet (10 mg total) by mouth as needed. May repeat in 2 hours if needed 15 tablet 11  . tamoxifen (NOLVADEX) 10 MG tablet Take 1 tablet (10 mg total) by mouth daily. 90 tablet 3  . topiramate (TOPAMAX) 100 MG tablet Take 100 mg by mouth daily.    Marland Kitchen zolmitriptan  (ZOMIG-ZMT) 5 MG disintegrating tablet Take 5 mg by mouth as needed.     . topiramate (TOPAMAX) 50 MG tablet Take 1 tablet (50 mg total) by mouth 2 (two) times daily. 180 tablet 3   No facility-administered medications prior to visit.    PAST MEDICAL HISTORY: Past Medical History:  Diagnosis Date  . Abdominal pain, acute, right upper quadrant   . Anxiety   . Back pain   . Depression   . Joint pain   . Migraine   . Obesity   . Pain    Toes on bilateral feet  . Polycystic ovarian disease   . Sleep apnea     PAST SURGICAL HISTORY: Past Surgical History:  Procedure Laterality Date  . CESAREAN SECTION    . MINISCUS REPAIR  2003 and 2005  . SIMPLE MASTECTOMY WITH AXILLARY SENTINEL NODE BIOPSY Right 4/4/[place]ins   Procedure: SIMPLE MASTECTOMY WITH WIRE LOCALIZATION AND  TARGETED RIGHT AXILLARY SENTINEL NODE MAPPING  AND BIOPSY ERAS PATHWAY;  Surgeon: Erroll Luna, MD;  Location: [facility];  Service: General;  Laterality: Right;  PECTORAL BLOCK  . TONSILLECTOMY  1996    FAMILY HISTORY: Family History  Problem Relation Age of Onset  . Emphysema Father        smoker  . Allergies Father   . Hyperlipidemia Father   . Hypertension Father   . Alcohol abuse Father   . Allergies Mother   . Hypertension Mother   . Rheum arthritis Mother   . Osteoporosis Mother   . Skin cancer Mother   . Breast cancer Other   . Heart attack Paternal Grandfather 31  . Colon cancer Paternal Aunt        dx 50's/60's  . Pancreatic cancer Maternal Grandmother 53  . Colon cancer Paternal Uncle 50  . Skin cancer Paternal Grandmother   . Breast cancer Cousin        dx 30's/40's  . Lung cancer Paternal Uncle     SOCIAL HISTORY: Social History   Socioeconomic History  . Marital status: Single    Spouse name: Not on file  . Number of children: 2  . Years of education: Bachelors  . Highest education level: Not on file  Occupational History  . Occupation: [place] - [employer]    Tobacco Use  . Smoking status: Former Smoker    Packs/day: 0.30    Years: 2.00    Pack years: 0.60    Types: Cigarettes    Quit date: 09/03/1992    Years since quitting: 27.8  . Smokeless tobacco: Never Used  Substance and Sexual Activity  . Alcohol use: Yes    Alcohol/week: 0.0 standard drinks  . Drug use: No  . Sexual activity: Not on file  Other Topics Concern  . Not on file  Social History Narrative   Lives at home with her two children (twins).   Right-handed.   2 cups caffeine per day.   Social Determinants of Radio broadcast assistant  Strain:   . Difficulty of Paying Living Expenses: Not on file  Food Insecurity:   . Worried About Charity fundraiser in the Last Year: Not on file  . Ran Out of Food in the Last Year: Not on file  Transportation Needs:   . Lack of Transportation (Medical): Not on file  . Lack of Transportation (Non-Medical): Not on file  Physical Activity:   . Days of Exercise per Week: Not on file  . Minutes of Exercise per Session: Not on file  Stress:   . Feeling of Stress : Not on file  Social Connections:   . Frequency of Communication with Friends and Family: Not on file  . Frequency of Social Gatherings with Friends and Family: Not on file  . Attends Religious Services: Not on file  . Active Member of Clubs or Organizations: Not on file  . Attends Archivist Meetings: Not on file  . Marital Status: Not on file  Intimate Partner Violence:   . Fear of Current or Ex-Partner: Not on file  . Emotionally Abused: Not on file  . Physically Abused: Not on file  . Sexually Abused: Not on file   PHYSICAL EXAM  Vitals:   06/27/20 0918  BP: 114/72  Weight: 250 lb 3.2 oz (113.5 kg)  Height: 5\' 3"  (1.6 m)   Body mass index is 44.32 kg/m.  Generalized: Well developed, in no acute distress   Neurological examination  Mentation: Alert oriented to time, place, history taking. Follows all commands speech and language fluent Cranial  nerve II-XII: Pupils were equal round reactive to light. Extraocular movements were full, visual field were full on confrontational test. Facial sensation and strength were normal.Head turning and shoulder shrug  were normal and symmetric. Motor: The motor testing reveals 5 over 5 strength of all 4 extremities. Good symmetric motor tone is noted throughout.  Sensory: Sensory testing is intact to soft touch on all 4 extremities. No evidence of extinction is noted.  Coordination: Cerebellar testing reveals good finger-nose-finger and heel-to-shin bilaterally.  Gait and station: Gait is normal.  Reflexes: Deep tendon reflexes are symmetric and normal bilaterally.   DIAGNOSTIC DATA (LABS, IMAGING, TESTING) - I reviewed patient records, labs, notes, testing and imaging myself where available.  Lab Results  Component Value Date   WBC 8.0 02/11/Higgins   HGB 13.4 02/11/Higgins   HCT 40.3 02/11/Higgins   MCV 90 02/11/Higgins   PLT 331.0 09/11/2018      Component Value Date/Time   NA 143 02/11/Higgins 1202   K 4.5 02/11/Higgins 1202   CL 105 02/11/H[facility] 1202   CO2 26 02/11/H[facility]ins 1202   GLUCOSE 73 02/11/H[place] 1202   GLUCOSE 137 (H) 09/11/2018 0901   BUN 10 02/11/H[facility]ins 1202   CREATININE 0.74 02/11/H[facility]ins 1202   CREATININE 0.84 03/13/Higgins 0840   CALCIUM 9.0 02/11/Higgins 1202   PROT 6.3 02/11/H[facility]ins 1202   ALBUMIN 3.8 02/11/Higgins 1202   AST 25 02/11/Higgins 1202   AST 16 03/13/Higgins 0840   ALT 23 02/11/Higgins 1202   ALT 22 03/13/Higgins 0840   ALKPHOS 88 02/11/H[facility]ins 1202   BILITOT <0.2 02/11/H[facility]ins 1202   BILITOT 0.5 03/13/Higgins 0840   GFRNONAA 97 02/11/Higgins 1202   GFRNONAA >60 03/13/Higgins 0840   GFRAA 112 02/11/H[facility]ins 1202   GFRAA >60 03/13/Higgins 0840   No results found for: CHOL, HDL, LDLCALC, LDLDIRECT, TRIG, CHOLHDL No results found for: HGBA1C Lab Results  Component Value Date   VITAMINB12 254 02/29/2016   Lab Results  Component Value Date   TSH 1.020 02/11/H[facility]ins   ASSESSMENT AND PLAN 49 y.o. year old female  has a  past medical history of Abdominal pain, acute, right upper quadrant, Anxiety, Back pain, Depression, Joint pain, Migraine, Obesity, Pain, Polycystic ovarian disease, and Sleep apnea. here with:  1.  Worsening daily headaches -Headaches currently under good control -History of right breast cancer -CBC, CMP, TSH were normal -MRI of the brain showed no significant abnormalities -Continue Topamax 50 mg twice a day ( let me know about 50 mg or 100 mg tablet for refill) -Continue Maxalt as needed, may combine with Aleve, Zofran for prolonged headache -Follow-up in 1 year or sooner if needed  I spent 20 minutes of face-to-face and non-face-to-face time with patient.  This included previsit chart review, lab review, study review, order entry, electronic health record documentation, patient education.  Butler Denmark, AGNP-C, DNP 10/25/Higgins, 9:57 AM [facility] [address] [place], [place] [address] [phone]

## 2020-06-28 ENCOUNTER — Telehealth: Payer: Self-pay | Admitting: Neurology

## 2020-06-28 MED ORDER — TOPIRAMATE 100 MG PO TABS: 100.0000 mg | ORAL_TABLET | Freq: Every day | ORAL | 3 refills | Status: DC

## 2020-06-28 NOTE — Telephone Encounter (Signed)
Pt called, was told to call back on how I am taking this medication. Topamax, on the bottle is 100 mg twice a day. But I'm taking only 100 mg once a day. If you send in a new prescription, send to [facility] in [place].

## 2020-06-28 NOTE — Telephone Encounter (Signed)
Noted, will send in 100 mg tablet once daily.

## 2020-07-05 NOTE — Progress Notes (Signed)
I have reviewed and agreed above plan. 

## 2020-07-18 NOTE — Progress Notes (Signed)
Virtual Visit via Video Note  I connected with Erica Higgins on 07/25/20 at  1:00 PM EST by a video enabled telemedicine application and verified that I am speaking with the correct person using two identifiers.  Location: Patient: work Provider: office Persons participated in the visit- patient, provider   I discussed the limitations of evaluation and management by telemedicine and the availability of in person appointments. The patient expressed understanding and agreed to proceed.    I discussed the assessment and treatment plan with the patient. The patient was provided an opportunity to ask questions and all were answered. The patient agreed with the plan and demonstrated an understanding of the instructions.   The patient was advised to call back or seek an in-person evaluation if the symptoms worsen or if the condition fails to improve as anticipated.  I provided 12 minutes of non-face-to-face time during this encounter.   Norman Clay, MD    [facility] MD/PA/NP OP Progress Note  07/25/2020 1:17 PM Erica Higgins  MRN:  [id number]  Chief Complaint:  Chief Complaint    Follow-up; Depression     HPI:  This is a follow-up appointment for depression.  She states that she has been doing "fine."  On further elaboration, she states that she has conflict with her daughter at home.  Her daughter is a 75 year old, and she wants to argue with Erica Higgins. Whatever Nastassja does, it is wrong to her daughter. Her daughter has started to do counseling.  She denies any issues at work.  She has hypersomnia.  Although she was diagnosed with sleep apnea, she has not used CPAP machine as she does not want to wear a mask.  She has fair energy.  She denies anhedonia, stating that she enjoyed going to animal park with her daughter.  She has fair concentration.  She lost a few pounds.  She denies SI.  She feels comfortable to continue Lexapro.    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  Household: twins Marital status: Number of children: 58 year old twins, born through artificial inseminationShe [place] up [place].  She states that it was rough as her father was seldomly sober. She was a only child. Her mother and her aunt took care of her. She loves her parents and good relationship with them.   Visit Diagnosis:    ICD-10-CM   1. MDD (major depressive disorder), recurrent, in full remission ([place])  F33.42     Past Psychiatric History: Please see initial evaluation for full details. I have reviewed the history. No updates at this time.     Past Medical History:  Past Medical History:  Diagnosis Date  . Abdominal pain, acute, right upper quadrant   . Anxiety   . Back pain   . Depression   . Joint pain   . Migraine   . Obesity   . Pain    Toes on bilateral feet  . Polycystic ovarian disease   . Sleep apnea     Past Surgical History:  Procedure Laterality Date  . CESAREAN SECTION    . MINISCUS REPAIR  2003 and 2005  . SIMPLE MASTECTOMY WITH AXILLARY SENTINEL NODE BIOPSY Right 12/05/2017   Procedure: SIMPLE MASTECTOMY WITH WIRE LOCALIZATION AND  TARGETED RIGHT AXILLARY SENTINEL NODE MAPPING  AND BIOPSY ERAS PATHWAY;  Surgeon: Erroll Luna, MD;  Location: [facility];  Service: General;  Laterality: Right;  PECTORAL BLOCK  . TONSILLECTOMY  1996    Family Psychiatric History: Please see initial evaluation for full details. I have reviewed the history. No updates at this time.     Family History:  Family History  Problem Relation Age of Onset  . Emphysema Father        smoker  . Allergies Father   . Hyperlipidemia Father   . Hypertension Father   . Alcohol abuse Father   . Allergies Mother   . Hypertension  Mother   . Rheum arthritis Mother   . Osteoporosis Mother   . Skin cancer Mother   . Breast cancer Other   . Heart attack Paternal Grandfather 12  . Colon cancer Paternal Aunt        dx 50's/60's  . Pancreatic cancer Maternal Grandmother 67  . Colon cancer Paternal Uncle 64  . Skin cancer Paternal Grandmother   . Breast cancer Cousin        dx 30's/40's  . Lung cancer Paternal Uncle     Social History:  Social History   Socioeconomic History  . Marital status: Single    Spouse name: Not on file  . Number of children: 2  . Years of education: Bachelors  . Highest education level: Not on file  Occupational History  . Occupation: [employer] - [employer]  Tobacco Use  . Smoking status: Former Smoker    Packs/day: 0.30    Years: 2.00    Pack years: 0.60    Types: Cigarettes    Quit date: 09/03/1992    Years since quitting: 27.9  . Smokeless tobacco: Never Used  Substance and Sexual Activity  . Alcohol use: Yes    Alcohol/week: 0.0 standard drinks  . Drug use: No  . Sexual activity: Not on file  Other Topics Concern  . Not on file  Social History Narrative   Lives at home with her two children (twins).   Right-handed.   2 cups caffeine per day.   Social Determinants of Health   Financial Resource Strain:   . Difficulty of Paying Living Expenses: Not on file  Food Insecurity:   . Worried About Charity fundraiser in the Last Year: Not on file  . Ran Out of Food in the Last Year: Not on file  Transportation Needs:   . Lack of Transportation (Medical): Not on file  . Lack of Transportation (Non-Medical): Not on file  Physical Activity:   . Days of Exercise per Week: Not on file  . Minutes of Exercise per Session: Not on file  Stress:   . Feeling of Stress : Not on file  Social Connections:   . Frequency of Communication with Friends and Family: Not on file  . Frequency of Social Gatherings with Friends and Family: Not on file  . Attends Religious Services: Not  on file  . Active Member of Clubs or Organizations: Not on file  . Attends Archivist Meetings: Not on file  . Marital Status: Not on file    Allergies:  Allergies  Allergen Reactions  . Cleocin [Clindamycin Hcl] Hives  . Ivp Dye [Iodinated Diagnostic Agents] Anaphylaxis  . Latex Hives  . Penicillins Hives  . Shellfish Allergy Anaphylaxis  . Adhesive [Tape]     Rash, itch  Metabolic Disorder Labs: No results found for: HGBA1C, MPG No results found for: PROLACTIN No results found for: CHOL, TRIG, HDL, CHOLHDL, VLDL, LDLCALC Lab Results  Component Value Date   TSH 1.020 10/15/2019   TSH 1.230 02/29/2016    Therapeutic Level Labs: No results found for: LITHIUM No results found for: VALPROATE No components found for:  CBMZ  Current Medications: Current Outpatient Medications  Medication Sig Dispense Refill  . [START ON 10/04/2020] escitalopram (LEXAPRO) 10 MG tablet Take 1 tablet (10 mg total) by mouth daily. 90 tablet 1  . ibuprofen (ADVIL,MOTRIN) 800 MG tablet Take 800 mg by mouth every 8 (eight) hours as needed.    . ondansetron (ZOFRAN) 4 MG tablet Take 1 tablet (4 mg total) by mouth every 8 (eight) hours as needed for nausea or vomiting. 20 tablet 1  . rizatriptan (MAXALT-MLT) 10 MG disintegrating tablet Take 1 tablet (10 mg total) by mouth as needed. May repeat in 2 hours if needed 15 tablet 11  . tamoxifen (NOLVADEX) 10 MG tablet Take 1 tablet (10 mg total) by mouth daily. 90 tablet 3  . topiramate (TOPAMAX) 100 MG tablet Take 1 tablet (100 mg total) by mouth daily. 90 tablet 3   No current facility-administered medications for this visit.     Musculoskeletal: Strength & Muscle Tone: N/A Gait & Station: N/A Patient leans: N/A  Psychiatric Specialty Exam: Review of Systems  Psychiatric/Behavioral: Positive for sleep disturbance. Negative for agitation, behavioral problems, confusion, decreased concentration, dysphoric mood, hallucinations,  self-injury and suicidal ideas. The patient is not nervous/anxious and is not hyperactive.   All other systems reviewed and are negative.   There were no vitals taken for this visit.There is no height or weight on file to calculate BMI.  General Appearance: Fairly Groomed  Eye Contact:  Good  Speech:  Clear and Coherent  Volume:  Normal  Mood:  good  Affect:  Appropriate, Congruent and euthymic  Thought Process:  Coherent  Orientation:  Full (Time, Place, and Person)  Thought Content: Logical   Suicidal Thoughts:  No  Homicidal Thoughts:  No  Memory:  Immediate;   Good  Judgement:  Good  Insight:  Fair  Psychomotor Activity:  Normal  Concentration:  Concentration: Good and Attention Span: Good  Recall:  Good  Fund of Knowledge: Good  Language: Good  Akathisia:  No  Handed:  Right  AIMS (if indicated): not done  Assets:  Communication Skills Desire for Improvement  ADL's:  Intact  Cognition: WNL  Sleep:  Poor   Screenings:   Assessment and Plan:  Mariela Behney is a 49 y.o. year old female with a history of depression,right breast cancer,treated with mastectomy, radiation, who presents for follow up appointment for below.   1. MDD (major depressive disorder), recurrent, in full remission ([place]) She denies significant mood symptoms except hypersomnia.  Will continue current dose of Lexapro as maintenance therapy for depression.  We will plan to continue the current medication regimen at least for another year until 01/2021.   # Sleep apnea She reports hypersomnia.  She does not use CPAP machine as she does not want to wear a mask.  She is advised to contact the provider for any other available options for treatment.   Plan 1.Continue lexapro10 mg daily  2.Next appointment- 2/14 at 3 PM for 20 mins, video  The patient demonstrates the following risk factors for suicide: Chronic risk factors for suicide include:psychiatric disorder ofdepression. Acute risk factorsfor  suicide include:  N/A. Protective factorsfor this patient include: positive social support, responsibility to others (children, family), coping skills and hope for the future. Considering these factors, the overall suicide risk at this point appears to below. Patientisappropriate for outpatient follow up.  Norman Clay, MD 07/25/2020, 1:17 PM

## 2020-07-25 ENCOUNTER — Other Ambulatory Visit: Payer: Self-pay

## 2020-07-25 ENCOUNTER — Telehealth (HOSPITAL_COMMUNITY): Admitting: Psychiatry

## 2020-07-25 ENCOUNTER — Telehealth (INDEPENDENT_AMBULATORY_CARE_PROVIDER_SITE_OTHER): Admitting: Psychiatry

## 2020-07-25 ENCOUNTER — Encounter: Payer: Self-pay | Admitting: Psychiatry

## 2020-07-25 DIAGNOSIS — F3342 Major depressive disorder, recurrent, in full remission: Secondary | ICD-10-CM | POA: Diagnosis not present

## 2020-07-25 MED ORDER — ESCITALOPRAM OXALATE 10 MG PO TABS: 10.0000 mg | ORAL_TABLET | Freq: Every day | ORAL | 1 refills | Status: DC

## 2020-07-25 NOTE — Patient Instructions (Signed)
1.Continue lexapro10 mg daily  2.Next appointment- 2/14 at 3 PM

## 2020-08-15 ENCOUNTER — Ambulatory Visit (INDEPENDENT_AMBULATORY_CARE_PROVIDER_SITE_OTHER): Admitting: Gastroenterology

## 2020-08-15 ENCOUNTER — Encounter: Payer: Self-pay | Admitting: Gastroenterology

## 2020-08-15 VITALS — BP 122/72 | HR 77 | Ht 64.0 in | Wt 249.0 lb

## 2020-08-15 DIAGNOSIS — Z800 Family history of malignant neoplasm of digestive organs: Secondary | ICD-10-CM | POA: Diagnosis not present

## 2020-08-15 DIAGNOSIS — R194 Change in bowel habit: Secondary | ICD-10-CM

## 2020-08-15 DIAGNOSIS — K625 Hemorrhage of anus and rectum: Secondary | ICD-10-CM

## 2020-08-15 MED ORDER — SUTAB 1479-225-188 MG PO TABS: 1.0000 | ORAL_TABLET | Freq: Once | ORAL | 0 refills | Status: AC

## 2020-08-15 NOTE — Progress Notes (Signed)
HPI :  49 year old female with a history of breast cancer, migraine headaches, joint pains, referred by the [facility] healthcare system for rectal bleeding and family history of colon cancer.  She states her paternal aunt had colon cancer diagnosed at age 41s, paternal uncle had colon cancer diagnosed at age 47, additional paternal uncle with history of colon polyps.  No first-degree family members with colon cancer.  She states for the past 2 months she is endorsed some rectal bleeding.  When it first occurred it was a few days in a row, then it has been happening perhaps once per week or so.  She endorses seeing red blood in the stool and this occurs.  No perianal pain or abdominal pains.  She denies any constipation or diarrhea but has had a sense of that sometimes difficult to evacuate her rectum completely of stool and she can strain at times on the toilet.  Again this been going on for a few months.  She has a history of breast cancer in 2019 treated with mastectomy, she is in remission from that.  She has had a prior colonoscopy when in the [place] in [place] in 2011, she thinks it was normal at that time.  She denies any problems with anesthesia in the past.  She denies any other GI tract symptoms that are bothering her at this time, otherwise feeling well.  Labs reviewed from the [place] as follows:  May 26, 2000 Hemoglobin 13.1, white blood cell count 7.72, platelets 259 Alk phos 82, AST 18, ALT 29, T bili 0.2 BUN 15, creatinine 0.87   Past Medical History:  Diagnosis Date   Abdominal pain, acute, right upper quadrant    Anxiety    Asthma    Back pain    Carcinoma in situ of right breast    Depression    Eczema    Fatty liver    GERD (gastroesophageal reflux disease)    Joint pain    Migraine    Obesity    Pain    Toes on bilateral feet   Polycystic ovarian disease    PTSD (post-traumatic stress disorder)    Sleep apnea      Past Surgical History:   Procedure Laterality Date   CESAREAN SECTION     MINISCUS REPAIR  2003 and 2005   SIMPLE MASTECTOMY WITH AXILLARY SENTINEL NODE BIOPSY Right 12/05/2017   Procedure: SIMPLE MASTECTOMY WITH WIRE LOCALIZATION AND  TARGETED RIGHT AXILLARY SENTINEL NODE MAPPING  AND BIOPSY ERAS PATHWAY;  Surgeon: Erroll Luna, MD;  Location: [facility];  Service: General;  Laterality: Right;  PECTORAL BLOCK   TONSILLECTOMY  1996   Family History  Problem Relation Age of Onset   Emphysema Father        smoker   Allergies Father    Hyperlipidemia Father    Hypertension Father    Alcohol abuse Father    Allergies Mother    Hypertension Mother    Rheum arthritis Mother    Osteoporosis Mother    Skin cancer Mother    Breast cancer Other    Heart attack Paternal Grandfather 62   Colon cancer Paternal Aunt        dx 50's/60's   Pancreatic cancer Maternal Grandmother 32   Colon cancer Paternal Uncle 16   Skin cancer Paternal Grandmother    Breast cancer Cousin        dx 64's/40's   Lung cancer Paternal Uncle  History  ° °Tobacco Use  °• Smoking status: Former Smoker  °  Packs/day: 0.30  °  Years: 2.00  °  Pack years: 0.60  °  Types: Cigarettes  °  Quit date: 09/03/1992  °  Years since quitting: 27.9  °• Smokeless tobacco: Never Used  °Substance Use Topics  °• Alcohol use: Yes  °  Alcohol/week: 0.0 standard drinks  °• Drug use: No  ° °Current Outpatient Medications  °Medication Sig Dispense Refill  °• [START ON 10/04/2020] escitalopram (LEXAPRO) 10 MG tablet Take 1 tablet (10 mg total) by mouth daily. 90 tablet 1  °• ibuprofen (ADVIL,MOTRIN) 800 MG tablet Take 800 mg by mouth every 8 (eight) hours as needed.    °• ondansetron (ZOFRAN) 4 MG tablet Take 1 tablet (4 mg total) by mouth every 8 (eight) hours as needed for nausea or vomiting. 20 tablet 1  °• rizatriptan (MAXALT-MLT) 10 MG disintegrating tablet Take 1 tablet (10 mg total) by mouth as needed. May repeat in 2  hours if needed 15 tablet 11  °• tamoxifen (NOLVADEX) 10 MG tablet Take 1 tablet (10 mg total) by mouth daily. 90 tablet 3  °• topiramate (TOPAMAX) 100 MG tablet Take 1 tablet (100 mg total) by mouth daily. 90 tablet 3  ° °No current facility-administered medications for this visit.  ° °Allergies  °Allergen Reactions  °• Cleocin [Clindamycin Hcl] Hives  °• Ivp Dye [Iodinated Diagnostic Agents] Anaphylaxis  °• Latex Hives  °• Penicillins Hives  °• Shellfish Allergy Anaphylaxis  °• Adhesive [Tape]   °  Rash, itch °  ° ° ° °Review of Systems: °All systems reviewed and negative except where noted in HPI.  ° ° °See HPI for labs ° °Physical Exam: °BP 122/72    Pulse 77    Ht 5' 4" (1.626 m)    Wt 249 lb (112.9 kg)    SpO2 98%    BMI 42.74 kg/m²  °Constitutional: Pleasant,well-developed, female in no acute distress. °HEENT: Normocephalic and atraumatic. Conjunctivae are normal. No scleral icterus. °Neck supple.  °Cardiovascular: Normal rate, regular rhythm.  °Pulmonary/chest: Effort normal and breath sounds normal.  °Abdominal: Soft, nondistended, nontender. There are no masses palpable.  °Extremities: no edema °Lymphadenopathy: No cervical adenopathy noted. °Neurological: Alert and oriented to person place and time. °Skin: Skin is warm and dry. No rashes noted. °Psychiatric: Normal mood and affect. Behavior is normal. ° ° °ASSESSMENT AND PLAN: °49-year-old female here for new patient assessment of the following: ° °Rectal bleeding °Altered bowel habits  °Family history of colon cancer ° °Intermittent rectal bleeding as outlined above in recent months in the setting of some altered bowels as described.  We discussed her family history of colon cancer, she has two second-degree relatives with CRC but no first-degree relatives.  Given the symptoms I am recommending a colonoscopy to further evaluate given her last colonoscopy was 10 years ago.  I discussed colonoscopy and anesthesia with her, risk benefits of each, she wanted  to proceed.  In the interim recommend she try daily fiber supplement to see if this would provide some regularity to her bowels.  She is hoping to proceed with this prior to the new year, I have added her on to have this done on December 29 at 7:30 AM.  She agreed with the plan, further recommendations pending results, all questions answered ° °Steven Armbruster, MD °[facility] ° ° °

## 2020-08-15 NOTE — Patient Instructions (Signed)
If you are age 47 or older, your body mass index should be between 23-30. Your Body mass index is 42.74 kg/m. If this is out of the aforementioned range listed, please consider follow up with your Primary Care Provider.  If you are age 83 or younger, your body mass index should be between 19-25. Your Body mass index is 42.74 kg/m. If this is out of the aformentioned range listed, please consider follow up with your Primary Care Provider.   You have been scheduled for a colonoscopy. Please follow written instructions given to you at your visit today.  Please pick up your prep supplies at the pharmacy within the next 1-3 days. If you use inhalers (even only as needed), please bring them with you on the day of your procedure.   Thank you for entrusting me with your care and for choosing [facility], Dr. McFarlan Cellar

## 2020-08-24 ENCOUNTER — Telehealth: Payer: Self-pay | Admitting: Hematology and Oncology

## 2020-08-24 NOTE — Telephone Encounter (Signed)
Rescheduled appointment due to provider PAL. Patient is aware of changes. 

## 2020-08-31 ENCOUNTER — Ambulatory Visit (AMBULATORY_SURGERY_CENTER): Admitting: Gastroenterology

## 2020-08-31 ENCOUNTER — Other Ambulatory Visit: Payer: Self-pay

## 2020-08-31 ENCOUNTER — Encounter: Payer: Self-pay | Admitting: Gastroenterology

## 2020-08-31 VITALS — BP 137/66 | HR 63 | Temp 98.6°F | Resp 11 | Ht 64.0 in | Wt 249.0 lb

## 2020-08-31 DIAGNOSIS — K625 Hemorrhage of anus and rectum: Secondary | ICD-10-CM

## 2020-08-31 DIAGNOSIS — D125 Benign neoplasm of sigmoid colon: Secondary | ICD-10-CM

## 2020-08-31 DIAGNOSIS — K635 Polyp of colon: Secondary | ICD-10-CM

## 2020-08-31 DIAGNOSIS — K633 Ulcer of intestine: Secondary | ICD-10-CM | POA: Diagnosis not present

## 2020-08-31 DIAGNOSIS — K640 First degree hemorrhoids: Secondary | ICD-10-CM | POA: Diagnosis not present

## 2020-08-31 MED ORDER — SODIUM CHLORIDE 0.9 % IV SOLN: 500.0000 mL | Freq: Once | INTRAVENOUS | Status: DC

## 2020-08-31 NOTE — Progress Notes (Signed)
History reviewed today  VS AG 

## 2020-08-31 NOTE — Progress Notes (Signed)
PT taken to PACU. Monitors in place. VSS. Report given to RN. 

## 2020-08-31 NOTE — Op Note (Addendum)
[facility] Patient Name: Erica Higgins Procedure Date: 08/31/2020 7:22 AM MRN: [id number] Endoscopist: Remo Lipps P. Havery Moros , MD Age: 49 Referring MD:  Date of Birth: 1970/11/09 Gender: Female Account #: [id number] Procedure:                Colonoscopy Indications:              Rectal bleeding, patient does have a history of                            Motrin use for migraines Medicines:                Monitored Anesthesia Care Procedure:                Pre-Anesthesia Assessment:                           - Prior to the procedure, a History and Physical                            was performed, and patient medications and                            allergies were reviewed. The patient's tolerance of                            previous anesthesia was also reviewed. The risks                            and benefits of the procedure and the sedation                            options and risks were discussed with the patient.                            All questions were answered, and informed consent                            was obtained. Prior Anticoagulants: The patient has                            taken no previous anticoagulant or antiplatelet                            agents. ASA Grade Assessment: III - A patient with                            severe systemic disease. After reviewing the risks                            and benefits, the patient was deemed in                            satisfactory condition to undergo the procedure.  After obtaining informed consent, the colonoscope                            was passed under direct vision. Throughout the                            procedure, the patient's blood pressure, pulse, and                            oxygen saturations were monitored continuously. The                            Olympus [id number]-[id number]) Colonoscope was                            introduced through the anus and  advanced to the the                            terminal ileum, with identification of the                            appendiceal orifice and IC valve. The colonoscopy                            was performed without difficulty. The patient                            tolerated the procedure well. The quality of the                            bowel preparation was good. The terminal ileum,                            ileocecal valve, appendiceal orifice, and rectum                            were photographed. Scope In: 7:36:02 AM Scope Out: 8:00:44 AM Scope Withdrawal Time: 0 hours 16 minutes 1 second  Total Procedure Duration: 0 hours 24 minutes 42 seconds  Findings:                 The perianal and digital rectal examinations were                            normal.                           The terminal ileum appeared normal.                           A single (solitary) three to four mm ulcer was                            found in the transverse colon with associated  erythema. Biopsies were taken with a cold forceps                            for histology.                           A 5 mm polyp was found in the sigmoid colon. The                            polyp was sessile. The polyp was removed with a                            cold snare. Resection and retrieval were complete.                           Internal hemorrhoids were found during retroflexion.                           The colon was quite tortous. The exam was otherwise                            without abnormality. No other inflammatory changes                            noted Complications:            No immediate complications. Estimated blood loss:                            Minimal. Estimated Blood Loss:     Estimated blood loss was minimal. Impression:               - The examined portion of the ileum was normal.                           - A single (solitary) ulcer in the transverse                             colon. Biopsied.                           - Internal hemorrhoids.                           - The examination was otherwise normal.                           Hemorrhoids may be the more likely cause of                            bleeding, but will await biopsy results of                            ulceration. Recommendation:           - Patient has a contact number available for  emergencies. The signs and symptoms of potential                            delayed complications were discussed with the                            patient. Return to normal activities tomorrow.                            Written discharge instructions were provided to the                            patient.                           - Resume previous diet.                           - Continue present medications.                           - Avoid / minimize NSAID use                           - Await pathology results. Viviann Spare P. Yvonne Petite, MD 08/31/2020 8:08:16 AM This report has been signed electronically.

## 2020-08-31 NOTE — Progress Notes (Signed)
Called to room to assist during endoscopic procedure.  Patient ID and intended procedure confirmed with present staff. Received instructions for my participation in the procedure from the performing physician.  

## 2020-08-31 NOTE — Patient Instructions (Addendum)
HANDOUTS PROVIDED ON: Polyps, hemorrhoids  The polyps removed/biopsies taken today have been sent for pathology.  The results can take 1-3 weeks to receive.  When your next colonoscopy should occur will be based on the pathology results.    You may resume your previous diet and medication schedule.  Avoid/minimize NSAID use.  Thank you for allowing [place] to care for you today!!!   YOU HAD AN ENDOSCOPIC PROCEDURE TODAY AT THE [facility]:   Refer to the procedure report that was given to you for any specific questions about what was found during the examination.  If the procedure report does not answer your questions, please call your gastroenterologist to clarify.  If you requested that your care partner not be given the details of your procedure findings, then the procedure report has been included in a sealed envelope for you to review at your convenience later.  YOU SHOULD EXPECT: Some feelings of bloating in the abdomen. Passage of more gas than usual.  Walking can help get rid of the air that was put into your GI tract during the procedure and reduce the bloating. If you had a lower endoscopy (such as a colonoscopy or flexible sigmoidoscopy) you may notice spotting of blood in your stool or on the toilet paper. If you underwent a bowel prep for your procedure, you may not have a normal bowel movement for a few days.  Please Note:  You might notice some irritation and congestion in your nose or some drainage.  This is from the oxygen used during your procedure.  There is no need for concern and it should clear up in a day or so.  SYMPTOMS TO REPORT IMMEDIATELY:   Following lower endoscopy (colonoscopy or flexible sigmoidoscopy):  Excessive amounts of blood in the stool  Significant tenderness or worsening of abdominal pains  Swelling of the abdomen that is new, acute  Fever of 100F or higher   For urgent or emergent issues, a gastroenterologist can be reached at any hour by  calling [phone]. Do not use MyChart messaging for urgent concerns.    DIET:  We do recommend a small meal at first, but then you may proceed to your regular diet.  Drink plenty of fluids but you should avoid alcoholic beverages for 24 hours.  ACTIVITY:  You should plan to take it easy for the rest of today and you should NOT DRIVE or use heavy machinery until tomorrow (because of the sedation medicines used during the test).    FOLLOW UP: Our staff will call the number listed on your records 48-72 hours following your procedure to check on you and address any questions or concerns that you may have regarding the information given to you following your procedure. If we do not reach you, we will leave a message.  We will attempt to reach you two times.  During this call, we will ask if you have developed any symptoms of COVID 19. If you develop any symptoms (ie: fever, flu-like symptoms, shortness of breath, cough etc.) before then, please call [phone].  If you test positive for Covid 19 in the 2 weeks post procedure, please call and report this information to [place].    If any biopsies were taken you will be contacted by phone or by letter within the next 1-3 weeks.  Please call us at [phone] if you have not heard about the biopsies in 3 weeks.    SIGNATURES/CONFIDENTIALITY: You and/or your care partner have  signed paperwork which will be entered into your electronic medical record.  These signatures attest to the fact that that the information above on your After Visit Summary has been reviewed and is understood.  Full responsibility of the confidentiality of this discharge information lies with you and/or your care-partner.

## 2020-09-05 ENCOUNTER — Telehealth: Payer: Self-pay | Admitting: *Deleted

## 2020-09-05 NOTE — Telephone Encounter (Signed)
  Follow up Call-  Call back number 08/31/2020  Post procedure Call Back phone  # [phone]  Permission to leave phone message Yes  Some recent data might be hidden     Patient questions:  Do you have a fever, pain , or abdominal swelling? No. Pain Score  0 *  Have you tolerated food without any problems? Yes.    Have you been able to return to your normal activities? Yes.    Do you have any questions about your discharge instructions: Diet   No. Medications  No. Follow up visit  No.  Do you have questions or concerns about your Care? No.  Actions: * If pain score is 4 or above: No action needed, pain <4.  1. Have you developed a fever since your procedure? no  2.   Have you had an respiratory symptoms (SOB or cough) since your procedure? no  3.   Have you tested positive for COVID 19 since your procedure no  4.   Have you had any family members/close contacts diagnosed with the COVID 19 since your procedure?  no   If yes to any of these questions please route to Laverna Peace, RN and Karlton Lemon, RN

## 2020-10-05 ENCOUNTER — Encounter: Payer: Self-pay | Admitting: Gastroenterology

## 2020-10-07 ENCOUNTER — Ambulatory Visit: Admitting: Hematology and Oncology

## 2020-10-13 NOTE — Progress Notes (Signed)
[facility] VISIT   Patient Care Team: Practice, [facility] as PCP - General Cristal Deer, DPM as Attending Physician (Podiatry) Nicholas Lose, MD as Consulting Physician (Hematology and Oncology) Erroll Luna, MD as Consulting Physician (General Surgery) Audry Pili, MD as Referring Physician (Radiation Oncology) Gardenia Phlegm, NP as Nurse Practitioner (Hematology and Oncology) Monico Blitz, MD as Referring Physician (Internal Medicine)  DIAGNOSIS:    ICD-10-CM   1. Malignant neoplasm of lower-outer quadrant of right breast of female, estrogen receptor positive ([place])  C50.511    Z17.0     SUMMARY OF ONCOLOGIC HISTORY: Oncology History  Malignant neoplasm of lower-outer quadrant of right breast of female, estrogen receptor positive (Erica Higgins)  11/06/2017 Initial Diagnosis   Palpable right breast mass which on radiologic evaluation was epidermoid cyst; incidentally found additional right breast mass at 8 o'clock position: 1.6 cm: IDC grade 2, ER 90%, PR 100%, Ki-67 2%, HER-2 negative, T1CN0 stage I a AJCC 8   11/25/2017 Genetic Testing   The Common Hereditary Cancer Panel offered by [facility] includes sequencing and/or deletion duplication testing of the following 47 genes: APC, ATM, AXIN2, BARD1, BMPR1A, BRCA1, BRCA2, BRIP1, CDH1, CDKN2A (p14ARF), CDKN2A (p16INK4a), CKD4, CHEK2, CTNNA1, DICER1, EPCAM (Deletion/duplication testing only), GREM1 (promoter region deletion/duplication testing only), KIT, MEN1, MLH1, MSH2, MSH3, MSH6, MUTYH, NBN, NF1, NHTL1, PALB2, PDGFRA, PMS2, POLD1, POLE, PTEN, RAD50, RAD51C, RAD51D, SDHB, SDHC, SDHD, SMAD4, SMARCA4. STK11, TP53, TSC1, TSC2, and VHL.  The following genes were evaluated for sequence changes only: SDHA and HOXB13 c.251G>A variant only.  Results: Negative, no pathogenic variants identified.  The date of this test report is 11/25/2017.    12/05/2017 Surgery   Right mastectomy: Invasive ductal carcinoma 1.1 cm with  lymphovascular invasion, 1/5 lymph nodes positive, grade 2, ER 90%, PR 100%, Ki-67 2%, HER-2 negative T1bN1 Stage 1B.  Mammaprint low risk   01/31/2018 - 03/14/2018 Radiation Therapy   Adjuvant XRT at [facility]   04/2018 -  Anti-estrogen oral therapy   Tamoxifen daily   06/25/2018 Cancer Staging   Staging form: Breast, AJCC 8th Edition - Pathologic: Stage IA (pT1b, pN1, cM0, G2, ER+, PR+, HER2-) - Signed by Gardenia Phlegm, NP on 06/25/2018     CHIEF COMPLIANT: Follow-up of right breast cancer on tamoxifen  INTERVAL HISTORY: Erica Higgins is a 50 y.o. with above-mentioned history of right breast cancer treated with mastectomy, radiation, and who is currently on tamoxifen. She presents to the clinic todayfor follow-up. Tolerating tamoxifen very well. Occ hot flashes. Arthritis better with losing weight and taking turmeric and Vit D.  ALLERGIES:  is allergic to cleocin [clindamycin hcl], ivp dye [iodinated diagnostic agents], latex, penicillins, shellfish allergy, and adhesive [tape].  MEDICATIONS:  Current Outpatient Medications  Medication Sig Dispense Refill   escitalopram (LEXAPRO) 10 MG tablet Take 1 tablet (10 mg total) by mouth daily. 90 tablet 1   ibuprofen (ADVIL,MOTRIN) 800 MG tablet Take 800 mg by mouth every 8 (eight) hours as needed.     ondansetron (ZOFRAN) 4 MG tablet Take 1 tablet (4 mg total) by mouth every 8 (eight) hours as needed for nausea or vomiting. 20 tablet 1   rizatriptan (MAXALT-MLT) 10 MG disintegrating tablet Take 1 tablet (10 mg total) by mouth as needed. May repeat in 2 hours if needed 15 tablet 11   tamoxifen (NOLVADEX) 10 MG tablet Take 1 tablet (10 mg total) by mouth daily. 90 tablet 3   topiramate (TOPAMAX) 100 MG tablet Take 1 tablet (100  mg total) by mouth daily. 90 tablet 3   No current facility-administered medications for this visit.    PHYSICAL EXAMINATION: ECOG PERFORMANCE STATUS: 1 - Symptomatic but completely  ambulatory   LABORATORY DATA:  I have reviewed the data as listed CMP Latest Ref Rng & Units 10/15/2019 09/11/2018 11/13/2017  Glucose 65 - 99 mg/dL 73 137(H) 123  BUN 6 - 24 mg/dL 10 9 12   Creatinine 0.57 - 1.00 mg/dL 0.74 0.82 0.84  Sodium 134 - 144 mmol/L 143 140 139  Potassium 3.5 - 5.2 mmol/L 4.5 3.8 3.8  Chloride 96 - 106 mmol/L 105 106 104  CO2 20 - 29 mmol/L 26 27 29   Calcium 8.7 - 10.2 mg/dL 9.0 8.9 9.4  Total Protein 6.0 - 8.5 g/dL 6.3 6.9 7.4  Total Bilirubin 0.0 - 1.2 mg/dL <0.2 0.4 0.5  Alkaline Phos 39 - 117 IU/L 88 69 87  AST 0 - 40 IU/L 25 22 16   ALT 0 - 32 IU/L 23 27 22     Lab Results  Component Value Date   WBC 8.0 10/15/2019   HGB 13.4 10/15/2019   HCT 40.3 10/15/2019   MCV 90 10/15/2019   PLT 331.0 09/11/2018   NEUTROABS 4.9 10/15/2019    ASSESSMENT & PLAN:  Malignant neoplasm of lower-outer quadrant of right breast of female, estrogen receptor positive (Erica Higgins) 12/05/17:Right mastectomy: Invasive ductal carcinoma 1.1 cm with lymphovascular invasion, 1/5 lymph nodes positive, grade 2, ER 90%, PR 100%, Ki-67 2%, HER-2 negative T1bN1 Stage 1B Mammaprint 12/21/2017: Luminal type A low risk Adjuvant radiation therapy completed  Plan: Tamoxifen 20 mg daily August 2019 dose reduced to 10 mg on June 19, 2019 because of severe joint pains and stiffness  Tamoxifen toxicities:  1.  Joint pains and stiffness: Much better on 10 mg dose 2. Occ Hot flashes Recent diagnosis of migraines and osteoarthritis: Tumeric is helping Lost weight Takes Vit D and B complex  Breast cancer surveillance: 1.  Breast exam October 08, 2019: Benign 2.  Mammogram left breast Due in March (in [place]) CT Chest 01/27/20: No evidence of mets  Return to clinic in 1 year for follow-up    No orders of the defined types were placed in this encounter.  The patient has a good understanding of the overall plan. she agrees with it. she will call with any problems that may develop before the  next visit here.  Total time spent: 20 mins including face to face time and time spent for planning, charting and coordination of care  Rulon Eisenmenger, MD, MPH 10/14/2020  I, Molly Dorshimer, am acting as scribe for Dr. Nicholas Lose.  I have reviewed the above documentation for accuracy and completeness, and I agree with the above.

## 2020-10-13 NOTE — Assessment & Plan Note (Signed)
12/05/17:Right mastectomy: Invasive ductal carcinoma 1.1 cm with lymphovascular invasion, 1/5 lymph nodes positive, grade 2, ER 90%, PR 100%, Ki-67 2%, HER-2 negative T1bN1 Stage 1B Mammaprint 12/21/2017: Luminal type A low risk Adjuvant radiation therapy completed  Plan: Tamoxifen 20 mg daily August 2019 dose reduced to 10 mg on June 19, 2019 because of severe joint pains and stiffness  Tamoxifen toxicities:  1.  Joint pains and stiffness: Much better on 10 mg dose Recent diagnosis of migraines and osteoarthritis  Breast cancer surveillance: 1.  Breast exam October 08, 2019: Benign 2.  Mammogram left breast CT Chest 01/27/20: No evidence of mets  Return to clinic in 1 year for follow-up

## 2020-10-14 ENCOUNTER — Inpatient Hospital Stay: Payer: Non-veteran care | Attending: Hematology and Oncology | Admitting: Hematology and Oncology

## 2020-10-14 DIAGNOSIS — Z170 Estrogen receptor positive status [ER+]: Secondary | ICD-10-CM | POA: Diagnosis not present

## 2020-10-14 DIAGNOSIS — C50511 Malignant neoplasm of lower-outer quadrant of right female breast: Secondary | ICD-10-CM | POA: Diagnosis not present

## 2020-10-14 MED ORDER — TAMOXIFEN CITRATE 10 MG PO TABS: 10.0000 mg | ORAL_TABLET | Freq: Every day | ORAL | 3 refills | Status: DC

## 2020-10-17 ENCOUNTER — Other Ambulatory Visit: Payer: Self-pay

## 2020-10-17 ENCOUNTER — Encounter: Payer: Self-pay | Admitting: Psychiatry

## 2020-10-17 ENCOUNTER — Telehealth (INDEPENDENT_AMBULATORY_CARE_PROVIDER_SITE_OTHER): Admitting: Psychiatry

## 2020-10-17 DIAGNOSIS — F3342 Major depressive disorder, recurrent, in full remission: Secondary | ICD-10-CM

## 2020-10-17 NOTE — Progress Notes (Signed)
Virtual Visit via Video Note  I connected with Erica Higgins on 10/17/20 at  3:00 PM EST by a video enabled telemedicine application and verified that I am speaking with the correct person using two identifiers.  Location: Patient: car Provider: office Persons participated in the visit- patient, provider   I discussed the limitations of evaluation and management by telemedicine and the availability of in person appointments. The patient expressed understanding and agreed to proceed.    I discussed the assessment and treatment plan with the patient. The patient was provided an opportunity to ask questions and all were answered. The patient agreed with the plan and demonstrated an understanding of the instructions.   The patient was advised to call back or seek an in-person evaluation if the symptoms worsen or if the condition fails to improve as anticipated.  I provided 13 minutes of non-face-to-face time during this encounter.   Norman Clay, MD    [facility] MD/PA/NP OP Progress Note  10/17/2020 3:23 PM Erica Higgins  MRN:  [id number]  Chief Complaint:  Chief Complaint    Follow-up; Depression     HPI:  This is a follow-up appointment for depression.  She states that she has been doing great.  Although she has been a little busy at work as people quit, it has not been stressful.  She is able to let it be, and has good work life balance.  She was able to take time off in December, and enjoyed the time with her children.  Her parents are taking care of them when she is at work.  She visits her parents every day.  She feels comfortable to stay on the medication.  She sleeps well.  She denies feeling depressed.  She has good concentration.  She denies change in appetite.  She has been trying to do more exercise.  She denies SI.   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  Support: her parents, who takes care of her children when she is at work Household: twins Marital status: Number of children: 48 year old twins, born through artificial insemination She grew up in [place]. She states that it was rough as her father was seldomly sober. She was a only child. Her mother and her aunt took care of her. She loves her parents and good relationship with them.  Wt Readings from Last 3 Encounters:  08/31/20 249 lb (112.9 kg)  08/15/20 249 lb (112.9 kg)  06/27/20 250 lb 3.2 oz (113.5 kg)    Visit Diagnosis:    ICD-10-CM   1. MDD (major depressive disorder), recurrent, in full remission ([place])  F33.42     Past Psychiatric History: Please see initial evaluation for full details. I have reviewed the history. No updates at this time.     Past Medical History:  Past Medical History:  Diagnosis Date  . Abdominal pain, acute, right upper quadrant   . Anxiety   . Asthma   . Back pain   . Carcinoma in situ of right breast   . Depression   . Eczema   . Fatty liver   . GERD (gastroesophageal reflux disease)   . Joint pain   . Migraine   . Obesity   . Pain  Toes on bilateral feet  . Polycystic ovarian disease   . PTSD (post-traumatic stress disorder)   . Sleep apnea     Past Surgical History:  Procedure Laterality Date  . CESAREAN SECTION    . MINISCUS REPAIR  2003 and 2005  . SIMPLE MASTECTOMY WITH AXILLARY SENTINEL NODE BIOPSY Right 12/05/2017   Procedure: SIMPLE MASTECTOMY WITH WIRE LOCALIZATION AND  TARGETED RIGHT AXILLARY SENTINEL NODE MAPPING  AND BIOPSY ERAS PATHWAY;  Surgeon: Erroll Luna, MD;  Location: [facility];  Service: General;  Laterality: Right;  PECTORAL BLOCK  . TONSILLECTOMY  1996    Family Psychiatric History: Please see initial evaluation for full details. I have reviewed the history.  No updates at this time.     Family History:  Family History  Problem Relation Age of Onset  . Emphysema Father        smoker  . Allergies Father   . Hyperlipidemia Father   . Hypertension Father   . Alcohol abuse Father   . Allergies Mother   . Hypertension Mother   . Rheum arthritis Mother   . Osteoporosis Mother   . Skin cancer Mother   . Breast cancer Other   . Heart attack Paternal Grandfather 68  . Colon cancer Paternal Aunt        dx 50's/60's  . Pancreatic cancer Maternal Grandmother 58  . Colon cancer Paternal Uncle 59  . Skin cancer Paternal Grandmother   . Breast cancer Cousin        dx 30's/40's  . Lung cancer Paternal Uncle   . Esophageal cancer Neg Hx   . Stomach cancer Neg Hx   . Ulcerative colitis Neg Hx     Social History:  Social History   Socioeconomic History  . Marital status: Single    Spouse name: Not on file  . Number of children: 2  . Years of education: Bachelors  . Highest education level: Not on file  Occupational History  . 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 Smoking status: Former Smoker    Packs/day: 0.30    Years: 2.00    Pack years: 0.60    Types: Cigarettes    Quit date: 09/03/1992    Years since quitting: 28.1  . Smokeless tobacco: Never Used  Substance and Sexual Activity  . Alcohol use: Yes    Alcohol/week: 0.0 standard drinks  . Drug use: No  . Sexual activity: Not on file  Other Topics Concern  . Not on file  Social History Narrative   Lives at home with her two children (twins).   Right-handed.   2 cups caffeine per day.   Social Determinants of Health   Financial Resource Strain: Not on file  Food Insecurity: Not on file  Transportation Needs: Not on file  Physical Activity: Not on file  Stress: Not on file  Social Connections: Not on file    Allergies:  Allergies  Allergen Reactions  . Cleocin [Clindamycin Hcl] Hives  . Ivp Dye [Iodinated Diagnostic Agents] Anaphylaxis  . Latex  Hives  . Penicillins Hives  . Shellfish Allergy Anaphylaxis  . Adhesive [Tape]     Rash, itch     Metabolic Disorder Labs: No results found for: HGBA1C, MPG No results found for: PROLACTIN No results found for: CHOL, TRIG, HDL, CHOLHDL, VLDL, LDLCALC Lab Results  Component Value Date   TSH 1.020 10/15/2019   TSH 1.230 02/29/2016    Therapeutic Level Labs:  No results found for: LITHIUM No results found for: VALPROATE No components found for:  CBMZ  Current Medications: Current Outpatient Medications  Medication Sig Dispense Refill  . escitalopram (LEXAPRO) 10 MG tablet Take 1 tablet (10 mg total) by mouth daily. 90 tablet 1  . ibuprofen (ADVIL,MOTRIN) 800 MG tablet Take 800 mg by mouth every 8 (eight) hours as needed.    . ondansetron (ZOFRAN) 4 MG tablet Take 1 tablet (4 mg total) by mouth every 8 (eight) hours as needed for nausea or vomiting. 20 tablet 1  . rizatriptan (MAXALT-MLT) 10 MG disintegrating tablet Take 1 tablet (10 mg total) by mouth as needed. May repeat in 2 hours if needed 15 tablet 11  . tamoxifen (NOLVADEX) 10 MG tablet Take 1 tablet (10 mg total) by mouth daily. 90 tablet 3  . topiramate (TOPAMAX) 100 MG tablet Take 1 tablet (100 mg total) by mouth daily. 90 tablet 3   No current facility-administered medications for this visit.     Musculoskeletal: Strength & Muscle Tone: N/A Gait & Station: N.A Patient leans: N/A  Psychiatric Specialty Exam: Review of Systems  Psychiatric/Behavioral: Negative for agitation, behavioral problems, confusion, decreased concentration, dysphoric mood, hallucinations, self-injury, sleep disturbance and suicidal ideas. The patient is not nervous/anxious and is not hyperactive.   All other systems reviewed and are negative.   There were no vitals taken for this visit.There is no height or weight on file to calculate BMI.  General Appearance: Fairly Groomed  Eye Contact:  Good  Speech:  Clear and Coherent  Volume:   Normal  Mood:  great  Affect:  Appropriate, Congruent and euthymic  Thought Process:  Coherent and Goal Directed  Orientation:  Full (Time, Place, and Person)  Thought Content: Logical   Suicidal Thoughts:  No  Homicidal Thoughts:  No  Memory:  Immediate;   Good  Judgement:  Good  Insight:  Good  Psychomotor Activity:  Normal  Concentration:  Concentration: Good and Attention Span: Good  Recall:  Good  Fund of Knowledge: Good  Language: Good  Akathisia:  No  Handed:  Right  AIMS (if indicated): not done  Assets:  Communication Skills Desire for Improvement  ADL's:  Intact  Cognition: WNL  Sleep:  Good   Screenings: PHQ2-9   Flowsheet Row Video Visit from 10/17/2020 in [facility]  PHQ-2 Total Score 0    Flowsheet Row Video Visit from 10/17/2020 in [facility] No Risk       Assessment and Plan:  Erica Higgins is a 50 y.o. year old female with a history of depression,right breast cancer,treated with mastectomy, radiation, who presents for follow up appointment for below.   1. MDD (major depressive disorder), recurrent, in full remission ([place]) She denies significant mood symptoms since the last visit.  Will continue current dose of Lexapro as maintenance treatment for depression.  We will plan to continue the current medication regimen at least for one year until 01/2021.  She agrees with the plan.   Plan 1.Continue lexapro10 mg daily - she declined refill 2.Next appointment- 5/16 at 1:18for 20 mins, video  The patient demonstrates the following risk factors for suicide: Chronic risk factors for suicide include:psychiatric disorder ofdepression. Acute risk factorsfor suicide include: N/A. Protective factorsfor this patient include: positive social support, responsibility to others (children, family), coping skills and hope for the future. Considering these factors, the overall suicide risk  at this point appears to below. Patientisappropriate  for outpatient follow up.   Norman Clay, MD 10/17/2020, 3:23 PM

## 2020-10-17 NOTE — Patient Instructions (Signed)
1.Continue lexapro10 mg daily  2.Next appointment- 5/16 at 1:40

## 2020-10-25 ENCOUNTER — Telehealth: Payer: Self-pay | Admitting: Neurology

## 2020-10-25 MED ORDER — TOPIRAMATE 100 MG PO TABS: 100.0000 mg | ORAL_TABLET | Freq: Every day | ORAL | 1 refills | Status: DC

## 2020-10-25 NOTE — Telephone Encounter (Signed)
Spoke to pharmacy have been getting prescription for topamax 100mg  po bid (10/2019 order) from [facility] last time 08-2020.  Needed to verify dosing.  I called pt and relayed that she did not get filled at [facility] as not working with her insurance.  She is using [facility].  She is taking topamax 100mg  po daily.  I did refill to [facility].  Pt in hospital now, has noted to me that will call when out for she has had increased headaches.

## 2020-10-25 NOTE — Telephone Encounter (Signed)
[facility] Raquel Sarna) called, need clarification on which one the fill. topiramate (TOPAMAX) 100 MG tablet was denied. We have a prescription on file back from [facility] for Topiramate 50 mg. Not sure what mg she is wanting to take. Would like a call from the nurse.

## 2020-11-23 ENCOUNTER — Telehealth: Payer: Self-pay | Admitting: Hematology and Oncology

## 2020-11-23 NOTE — Telephone Encounter (Signed)
Received a phone call from [facility] at [facility] that patient had a mammogram that showed calcifications in the left breast and they are pursuing that for a biopsy.  I provided them with my cell phone number to call me once we have the results.

## 2020-11-30 ENCOUNTER — Other Ambulatory Visit: Payer: Self-pay | Admitting: Radiation Oncology

## 2020-11-30 DIAGNOSIS — R921 Mammographic calcification found on diagnostic imaging of breast: Secondary | ICD-10-CM

## 2020-12-07 ENCOUNTER — Ambulatory Visit
Admission: RE | Admit: 2020-12-07 | Discharge: 2020-12-07 | Disposition: A | Discharge status: Home / Self Care | Source: Ambulatory Visit | Attending: Radiation Oncology | Admitting: Radiation Oncology

## 2020-12-07 ENCOUNTER — Other Ambulatory Visit: Payer: Self-pay

## 2020-12-07 DIAGNOSIS — R921 Mammographic calcification found on diagnostic imaging of breast: Secondary | ICD-10-CM

## 2021-01-16 ENCOUNTER — Telehealth: Admitting: Psychiatry

## 2021-07-03 ENCOUNTER — Ambulatory Visit: Admitting: Neurology

## 2021-07-18 ENCOUNTER — Ambulatory Visit: Admitting: Neurology

## 2021-07-31 ENCOUNTER — Telehealth: Payer: Self-pay | Admitting: Neurology

## 2021-07-31 NOTE — Telephone Encounter (Signed)
Sent patient a message to call the office to reschedule because Judson Roch is out this week. There are reschedule days held at the end of December.

## 2021-08-02 ENCOUNTER — Ambulatory Visit: Admitting: Neurology

## 2021-10-16 ENCOUNTER — Ambulatory Visit: Payer: Non-veteran care | Admitting: Hematology and Oncology

## 2021-10-17 NOTE — Assessment & Plan Note (Signed)
12/05/17:Right mastectomy: Invasive ductal carcinoma 1.1 cm with lymphovascular invasion, 1/5 lymph nodes positive, grade 2, ER 90%, PR 100%, Ki-67 2%, HER-2 negative T1bN1 Stage 1B Mammaprint 12/21/2017: Luminal type A low risk Adjuvant radiation therapy completed  Plan:Tamoxifen 20 mg daily August 2019dose reduced to 10 mg on June 19, 2019 because of severe joint pains and stiffness  Tamoxifen toxicities: 1.Joint pains and stiffness:Much better on 10 mg dose 2. Occ Hot flashes Recent diagnosis of migrainesand osteoarthritis: Tumeric is helping Lost weight Takes Vit D and B complex  Breast cancer surveillance: 1.Breast exam 10/18/21: Benign 2.Mammogram left breast Due in March (in [place]) CT Chest 01/27/20: No evidence of mets  Return to clinic in 1 year for follow-up

## 2021-10-17 NOTE — Progress Notes (Signed)
Patient Care Team: [facility], [facility] as PCP - General Cristal Deer, DPM as Attending Physician (Podiatry) Nicholas Lose, MD as Consulting Physician (Hematology and Oncology) Erroll Luna, MD as Consulting Physician (General Surgery) Audry Pili, MD as Referring Physician (Radiation Oncology) Gardenia Phlegm, NP as Nurse Practitioner (Hematology and Oncology) Monico Blitz, MD as Referring Physician (Internal Medicine)  DIAGNOSIS:    ICD-10-CM   1. Malignant neoplasm of lower-outer quadrant of right breast of female, estrogen receptor positive ([place])  C50.511    Z17.0       SUMMARY OF ONCOLOGIC HISTORY: Oncology History  Malignant neoplasm of lower-outer quadrant of right breast of female, estrogen receptor positive ([place])  11/06/2017 Initial Diagnosis   Palpable right breast mass which on radiologic evaluation was epidermoid cyst; incidentally found additional right breast mass at 8 o'clock position: 1.6 cm: IDC grade 2, ER 90%, PR 100%, Ki-67 2%, HER-2 negative, T1CN0 stage I a AJCC 8   11/25/2017 Genetic Testing   The Common Hereditary Cancer Panel offered by [facility] includes sequencing and/or deletion duplication testing of the following 47 genes: APC, ATM, AXIN2, BARD1, BMPR1A, BRCA1, BRCA2, BRIP1, CDH1, CDKN2A (p14ARF), CDKN2A (p16INK4a), CKD4, CHEK2, CTNNA1, DICER1, EPCAM (Deletion/duplication testing only), GREM1 (promoter region deletion/duplication testing only), KIT, MEN1, MLH1, MSH2, MSH3, MSH6, MUTYH, NBN, NF1, NHTL1, PALB2, PDGFRA, PMS2, POLD1, POLE, PTEN, RAD50, RAD51C, RAD51D, SDHB, SDHC, SDHD, SMAD4, SMARCA4. STK11, TP53, TSC1, TSC2, and VHL.  The following genes were evaluated for sequence changes only: SDHA and HOXB13 c.251G>A variant only.  Results: Negative, no pathogenic variants identified.  The date of this test report is 11/25/2017.    12/05/2017 Surgery   Right mastectomy: Invasive ductal carcinoma 1.1 cm with lymphovascular invasion,  1/5 lymph nodes positive, grade 2, ER 90%, PR 100%, Ki-67 2%, HER-2 negative T1bN1 Stage 1B.  Mammaprint low risk   01/31/2018 - 03/14/2018 Radiation Therapy   Adjuvant XRT at [facility]   04/2018 -  Anti-estrogen oral therapy   Tamoxifen daily   06/25/2018 Cancer Staging   Staging form: Breast, AJCC 8th Edition - Pathologic: Stage IA (pT1b, pN1, cM0, G2, ER+, PR+, HER2-) - Signed by Gardenia Phlegm, NP on 06/25/2018      CHIEF COMPLIANT: Follow-up of right breast cancer on tamoxifen  INTERVAL HISTORY: Erica Higgins is a 51 y.o. with above-mentioned history of right breast cancer treated with mastectomy, radiation, and who is currently on tamoxifen. She presents to the clinic today for follow-up.  She is tolerating tamoxifen extremely well at 10 mg dose.  She no longer has joint aches and stiffness.  Denies any major problems with the hot flashes.  She changed her jobs and her migraines have subsided.  ALLERGIES:  is allergic to cleocin [clindamycin hcl], ivp dye [iodinated contrast media], latex, penicillins, shellfish allergy, and adhesive [tape].  MEDICATIONS:  Current Outpatient Medications  Medication Sig Dispense Refill   escitalopram (LEXAPRO) 10 MG tablet Take 1 tablet (10 mg total) by mouth daily. 90 tablet 1   ibuprofen (ADVIL,MOTRIN) 800 MG tablet Take 800 mg by mouth every 8 (eight) hours as needed.     ondansetron (ZOFRAN) 4 MG tablet Take 1 tablet (4 mg total) by mouth every 8 (eight) hours as needed for nausea or vomiting. 20 tablet 1   rizatriptan (MAXALT-MLT) 10 MG disintegrating tablet Take 1 tablet (10 mg total) by mouth as needed. May repeat in 2 hours if needed 15 tablet 11   tamoxifen (NOLVADEX) 10 MG tablet Take  1 tablet (10 mg total) by mouth daily. 90 tablet 3   topiramate (TOPAMAX) 100 MG tablet Take 1 tablet (100 mg total) by mouth daily. 90 tablet 1   No current facility-administered medications for this visit.    PHYSICAL EXAMINATION: ECOG PERFORMANCE  STATUS: 1 - Symptomatic but completely ambulatory  Vitals:   10/18/21 0959  BP: (!) 137/56  Pulse: 72  Resp: 18  Temp: 97.7 F (36.5 C)  SpO2: 98%   Filed Weights   10/18/21 0959  Weight: 267 lb 8 oz (121.3 kg)    BREAST: Right mastectomy scar no palpable lumps or nodules.  No lumps or nodules in the left breast. (exam performed in the presence of a chaperone)  LABORATORY DATA:  I have reviewed the data as listed CMP Latest Ref Rng & Units 10/15/2019 09/11/2018 11/13/2017  Glucose 65 - 99 mg/dL 73 137(H) 123  BUN 6 - 24 mg/dL _0 Creatinine 0.57 - 1.00 mg/dL 0.74 0.82 0.84  Sodium 134 - 144 mmol/L 143 140 139  Potassium 3.5 - 5.2 mmol/L 4.5 3.8 3.8  Chloride 96 - 106 mmol/L 105 106 104  CO2 20 - 29 mmol/L _1 Calcium 8.7 - 10.2 mg/dL 9.0 8.9 9.4  Total Protein 6.0 - 8.5 g/dL 6.3 6.9 7.4  Total Bilirubin 0.0 - 1.2 mg/dL <0.2 0.4 0.5  Alkaline Phos 39 - 117 IU/L 88 69 87  AST 0 - 40 IU/L _2 ALT 0 - 32 IU/L _3 Lab Results  Component Value Date   WBC 8.0 10/15/2019   HGB 13.4 10/15/2019   HCT 40.3 10/15/2019   MCV 90 10/15/2019   PLT 331.0 09/11/2018   NEUTROABS 4.9 10/15/2019    ASSESSMENT & PLAN:  Malignant neoplasm of lower-outer quadrant of right breast of female, estrogen receptor positive ([place]) 12/05/17: Right mastectomy: Invasive ductal carcinoma 1.1 cm with lymphovascular invasion, 1/5 lymph nodes positive, grade 2, ER 90%, PR 100%, Ki-67 2%, HER-2 negative T1bN1 Stage 1B Mammaprint 12/21/2017: Luminal type A low risk Adjuvant radiation therapy completed   Plan: Tamoxifen 20 mg daily August 2019 dose reduced to 10 mg on June 19, 2019 because of severe joint pains and stiffness   Tamoxifen toxicities:  1.  Joint pains and stiffness: Much better on 10 mg dose 2. 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  She works in  [employer]. Lost weight     Breast cancer surveillance: 1.  Breast exam 10/18/21: Benign 2.  Mammogram left breast Due in March (she wants to do it at the breast center) CT Chest 01/27/20: No evidence of mets   Return to clinic in 1 year for follow-up    No orders of the defined types were placed in this encounter.  The patient has a good understanding of the overall plan. she agrees with it. she will call with any problems that may develop before the next visit here.  Total time spent: 20 mins including face to face time and time spent for planning, charting and coordination of care  Rulon Eisenmenger, MD, MPH 10/18/2021  I, Thana Ates, am acting as scribe for Dr. Nicholas Lose.  I have reviewed the above documentation for accuracy and completeness, and I agree with the above.

## 2021-10-18 ENCOUNTER — Other Ambulatory Visit: Payer: Self-pay

## 2021-10-18 ENCOUNTER — Inpatient Hospital Stay: Attending: Hematology and Oncology | Admitting: Hematology and Oncology

## 2021-10-18 DIAGNOSIS — M1990 Unspecified osteoarthritis, unspecified site: Secondary | ICD-10-CM | POA: Diagnosis not present

## 2021-10-18 DIAGNOSIS — Z9011 Acquired absence of right breast and nipple: Secondary | ICD-10-CM | POA: Diagnosis not present

## 2021-10-18 DIAGNOSIS — C50511 Malignant neoplasm of lower-outer quadrant of right female breast: Secondary | ICD-10-CM | POA: Diagnosis not present

## 2021-10-18 DIAGNOSIS — M2550 Pain in unspecified joint: Secondary | ICD-10-CM | POA: Diagnosis not present

## 2021-10-18 DIAGNOSIS — Z881 Allergy status to other antibiotic agents status: Secondary | ICD-10-CM | POA: Diagnosis not present

## 2021-10-18 DIAGNOSIS — Z888 Allergy status to other drugs, medicaments and biological substances status: Secondary | ICD-10-CM | POA: Diagnosis not present

## 2021-10-18 DIAGNOSIS — G43909 Migraine, unspecified, not intractable, without status migrainosus: Secondary | ICD-10-CM | POA: Insufficient documentation

## 2021-10-18 DIAGNOSIS — Z170 Estrogen receptor positive status [ER+]: Secondary | ICD-10-CM | POA: Insufficient documentation

## 2021-10-18 DIAGNOSIS — Z79810 Long term (current) use of selective estrogen receptor modulators (SERMs): Secondary | ICD-10-CM | POA: Insufficient documentation

## 2021-10-18 DIAGNOSIS — Z79899 Other long term (current) drug therapy: Secondary | ICD-10-CM | POA: Diagnosis not present

## 2021-10-18 DIAGNOSIS — Z923 Personal history of irradiation: Secondary | ICD-10-CM | POA: Diagnosis not present

## 2021-10-18 DIAGNOSIS — R634 Abnormal weight loss: Secondary | ICD-10-CM | POA: Insufficient documentation

## 2021-10-18 DIAGNOSIS — Z880 Allergy status to penicillin: Secondary | ICD-10-CM | POA: Insufficient documentation

## 2021-10-18 DIAGNOSIS — R232 Flushing: Secondary | ICD-10-CM | POA: Diagnosis not present

## 2021-10-18 MED ORDER — TAMOXIFEN CITRATE 10 MG PO TABS: 10.0000 mg | ORAL_TABLET | Freq: Every day | ORAL | 3 refills | Status: DC

## 2021-11-27 ENCOUNTER — Ambulatory Visit

## 2021-12-01 ENCOUNTER — Ambulatory Visit
Admission: RE | Admit: 2021-12-01 | Discharge: 2021-12-01 | Disposition: A | Discharge status: Home / Self Care | Source: Ambulatory Visit | Attending: Hematology and Oncology | Admitting: Hematology and Oncology

## 2021-12-01 ENCOUNTER — Other Ambulatory Visit: Payer: Self-pay | Admitting: Hematology and Oncology

## 2021-12-01 DIAGNOSIS — Z170 Estrogen receptor positive status [ER+]: Secondary | ICD-10-CM

## 2021-12-01 HISTORY — DX: Personal history of irradiation: Z92.3

## 2022-08-03 ENCOUNTER — Telehealth: Payer: Self-pay | Admitting: Hematology and Oncology

## 2022-08-03 NOTE — Telephone Encounter (Signed)
Called patient to r/s appointment per 11/29 in basket. Patient r/s and notified.

## 2022-10-02 ENCOUNTER — Encounter (HOSPITAL_BASED_OUTPATIENT_CLINIC_OR_DEPARTMENT_OTHER): Payer: Self-pay | Admitting: Pulmonary Disease

## 2022-10-02 ENCOUNTER — Ambulatory Visit (INDEPENDENT_AMBULATORY_CARE_PROVIDER_SITE_OTHER): Admitting: Pulmonary Disease

## 2022-10-02 VITALS — BP 112/68 | HR 88 | Temp 98.2°F | Ht 63.0 in | Wt 274.2 lb

## 2022-10-02 DIAGNOSIS — R053 Chronic cough: Secondary | ICD-10-CM

## 2022-10-02 DIAGNOSIS — J84116 Cryptogenic organizing pneumonia: Secondary | ICD-10-CM | POA: Diagnosis not present
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
## 2022-10-02 NOTE — Patient Instructions (Signed)
High res CT chest @ [facility]

## 2022-10-02 NOTE — Assessment & Plan Note (Signed)
Differential for her chronic cough includes usual suspects of postnasal drip, reflux, less likely cough variant asthma.  However she has been treated for these etiologies without relief. 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 Also with her history of breast cancer we have to ensure that there is no metastatic disease.  Will proceed with high-resolution CT of the chest to investigate. If that is negative we may have to consider treatment for postnasal drip again with a 4 generation antihistaminic/decongestant combination

## 2022-10-04 ENCOUNTER — Other Ambulatory Visit: Payer: Self-pay | Admitting: *Deleted

## 2022-10-04 MED ORDER — TAMOXIFEN CITRATE 10 MG PO TABS: 10.0000 mg | ORAL_TABLET | Freq: Every day | ORAL | 0 refills | Status: DC

## 2022-10-19 ENCOUNTER — Telehealth: Payer: Self-pay | Admitting: Hematology and Oncology

## 2022-10-19 NOTE — Telephone Encounter (Signed)
Rescheduled appointment per provider PAL. Patient is aware of the changes made to her upcoming appointment. 

## 2022-10-22 ENCOUNTER — Telehealth: Payer: Self-pay | Admitting: Hematology and Oncology

## 2022-10-22 ENCOUNTER — Ambulatory Visit (HOSPITAL_COMMUNITY)
Admission: RE | Admit: 2022-10-22 | Discharge: 2022-10-22 | Disposition: A | Discharge status: Home / Self Care | Source: Ambulatory Visit | Attending: Pulmonary Disease | Admitting: Pulmonary Disease

## 2022-10-22 DIAGNOSIS — J84116 Cryptogenic organizing pneumonia: Secondary | ICD-10-CM

## 2022-10-22 NOTE — Telephone Encounter (Signed)
Per 2/16 IB reached out to patient to reschedule; patient aware and confirmed.

## 2022-10-24 ENCOUNTER — Ambulatory Visit: Admitting: Hematology and Oncology

## 2022-10-24 ENCOUNTER — Other Ambulatory Visit: Payer: Self-pay

## 2022-10-24 DIAGNOSIS — R911 Solitary pulmonary nodule: Secondary | ICD-10-CM

## 2022-10-26 ENCOUNTER — Ambulatory Visit: Admitting: Hematology and Oncology

## 2022-11-02 ENCOUNTER — Encounter (HOSPITAL_BASED_OUTPATIENT_CLINIC_OR_DEPARTMENT_OTHER): Payer: Self-pay | Admitting: Pulmonary Disease

## 2022-11-02 ENCOUNTER — Ambulatory Visit (INDEPENDENT_AMBULATORY_CARE_PROVIDER_SITE_OTHER): Admitting: Pulmonary Disease

## 2022-11-02 VITALS — BP 122/78 | HR 65 | Ht 63.0 in | Wt 277.0 lb

## 2022-11-02 DIAGNOSIS — J45991 Cough variant asthma: Secondary | ICD-10-CM

## 2022-11-02 DIAGNOSIS — R053 Chronic cough: Secondary | ICD-10-CM | POA: Diagnosis not present

## 2022-11-02 MED ORDER — AIRSUPRA 90-80 MCG/ACT IN AERO: 2.0000 | INHALATION_SPRAY | Freq: Four times a day (QID) | RESPIRATORY_TRACT | 5 refills | Status: AC | PRN

## 2022-11-02 NOTE — Patient Instructions (Signed)
Okay to stop taking Flovent  X sample of air supra 2 puffs every 6 hours as needed for coughing/wheezing xSchedule spirometry pre and post  Take Tessalon Perles twice daily as needed
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
## 2022-11-02 NOTE — Assessment & Plan Note (Signed)
High-resolution CT chest is reassuring. There is no ILD.  I think we are left with usual suspects of postnasal drip and reflux although ENT evaluation was negative for these.  No history of intermittent wheezing makes me suspect cough variant asthma and we will proceed with spirometry pre and post to clarify.  Ideally she needs methacholine challenge test if symptoms are persistent. I will also switch her from Flovent to air supra since she reports increased coughing with Flovent. She will continue to use benzonatate cough Perles for symptomatic relief

## 2022-11-09 ENCOUNTER — Encounter (HOSPITAL_BASED_OUTPATIENT_CLINIC_OR_DEPARTMENT_OTHER): Payer: Self-pay | Admitting: Pulmonary Disease

## 2022-11-09 NOTE — Progress Notes (Signed)
Patient Care Team: [facility] as PCP - General Cristal Deer, DPM as Attending Physician (Podiatry) Nicholas Lose, MD as Consulting Physician (Hematology and Oncology) Erroll Luna, MD as Consulting Physician (General Surgery) Audry Pili, MD as Referring Physician (Radiation Oncology) Gardenia Phlegm, NP as Nurse Practitioner (Hematology and Oncology) Monico Blitz, MD as Referring Physician (Internal Medicine)  DIAGNOSIS: No diagnosis found.  SUMMARY OF ONCOLOGIC HISTORY: Oncology History  Malignant neoplasm of lower-outer quadrant of right breast of female, estrogen receptor positive ([place])  11/06/2017 Initial Diagnosis   Palpable right breast mass which on radiologic evaluation was epidermoid cyst; incidentally found additional right breast mass at 8 o'clock position: 1.6 cm: IDC grade 2, ER 90%, PR 100%, Ki-67 2%, HER-2 negative, T1CN0 stage I a AJCC 8   11/25/2017 Genetic Testing   The Common Hereditary Cancer Panel offered by [facility] includes sequencing and/or deletion duplication testing of the following 47 genes: APC, ATM, AXIN2, BARD1, BMPR1A, BRCA1, BRCA2, BRIP1, CDH1, CDKN2A (p14ARF), CDKN2A (p16INK4a), CKD4, CHEK2, CTNNA1, DICER1, EPCAM (Deletion/duplication testing only), GREM1 (promoter region deletion/duplication testing only), KIT, MEN1, MLH1, MSH2, MSH3, MSH6, MUTYH, NBN, NF1, NHTL1, PALB2, PDGFRA, PMS2, POLD1, POLE, PTEN, RAD50, RAD51C, RAD51D, SDHB, SDHC, SDHD, SMAD4, SMARCA4. STK11, TP53, TSC1, TSC2, and VHL.  The following genes were evaluated for sequence changes only: SDHA and HOXB13 c.251G>A variant only.  Results: Negative, no pathogenic variants identified.  The date of this test report is 11/25/2017.    12/05/2017 Surgery   Right mastectomy: Invasive ductal carcinoma 1.1 cm with lymphovascular invasion, 1/5 lymph nodes positive, grade 2, ER 90%, PR 100%, Ki-67 2%, HER-2 negative T1bN1 Stage 1B.  Mammaprint low risk   01/31/2018 -  03/14/2018 Radiation Therapy   Adjuvant XRT at [facility]   04/2018 -  Anti-estrogen oral therapy   Tamoxifen daily   06/25/2018 Cancer Staging   Staging form: Breast, AJCC 8th Edition - Pathologic: Stage IA (pT1b, pN1, cM0, G2, ER+, PR+, HER2-) - Signed by Gardenia Phlegm, NP on 06/25/2018     CHIEF COMPLIANT: Follow-up of right breast cancer on tamoxifen   INTERVAL HISTORY: Erica Higgins is a 52 y.o. with above-mentioned history of right breast cancer treated with mastectomy, radiation, and who is currently on tamoxifen. She presents to the clinic today for follow-up.     ALLERGIES:  is allergic to cleocin [clindamycin hcl], ivp dye [iodinated contrast media], latex, penicillins, shellfish allergy, and adhesive [tape].  MEDICATIONS:  Current Outpatient Medications  Medication Sig Dispense Refill   Albuterol-Budesonide (AIRSUPRA) 90-80 MCG/ACT AERO Inhale 2 puffs into the lungs every 6 (six) hours as needed. 10.7 g 5   benzonatate (TESSALON) 200 MG capsule Take by mouth.     fluticasone (FLONASE) 50 MCG/ACT nasal spray      ipratropium (ATROVENT) 0.06 % nasal spray Place 2 sprays into both nostrils 3 (three) times daily.     oxybutynin (DITROPAN) 5 MG tablet Take by mouth.     pantoprazole (PROTONIX) 40 MG tablet  (Patient not taking: Reported on 11/02/2022)     tamoxifen (NOLVADEX) 10 MG tablet Take 1 tablet (10 mg total) by mouth daily. 90 tablet 0   No current facility-administered medications for this visit.    PHYSICAL EXAMINATION: ECOG PERFORMANCE STATUS: {CHL ONC ECOG PS:[id number]}  There were no vitals filed for this visit. There were no vitals filed for this visit.  BREAST:*** No palpable masses or nodules in either right or left breasts. No palpable axillary supraclavicular or infraclavicular  adenopathy no breast tenderness or nipple discharge. (exam performed in the presence of a chaperone)  LABORATORY DATA:  I have reviewed the data as listed    Latest Ref Rng &  Units 10/15/2019   12:02 PM 09/11/2018    9:01 AM 11/13/2017    8:40 AM  CMP  Glucose 65 - 99 mg/dL 73  137  123   BUN 6 - 24 mg/dL '10  9  12   '$ Creatinine 0.57 - 1.00 mg/dL 0.74  0.82  0.84   Sodium 134 - 144 mmol/L 143  140  139   Potassium 3.5 - 5.2 mmol/L 4.5  3.8  3.8   Chloride 96 - 106 mmol/L 105  106  104   CO2 20 - 29 mmol/L '26  27  29   '$ Calcium 8.7 - 10.2 mg/dL 9.0  8.9  9.4   Total Protein 6.0 - 8.5 g/dL 6.3  6.9  7.4   Total Bilirubin 0.0 - 1.2 mg/dL <0.2  0.4  0.5   Alkaline Phos 39 - 117 IU/L 88  69  87   AST 0 - 40 IU/L '25  22  16   '$ ALT 0 - 32 IU/L '23  27  22     '$ Lab Results  Component Value Date   WBC 8.0 10/15/2019   HGB 13.4 10/15/2019   HCT 40.3 10/15/2019   MCV 90 10/15/2019   PLT 331.0 09/11/2018   NEUTROABS 4.9 10/15/2019    ASSESSMENT & PLAN:  No problem-specific Assessment & Plan notes found for this encounter.    No orders of the defined types were placed in this encounter.  The patient has a good understanding of the overall plan. she agrees with it. she will call with any problems that may develop before the next visit here. Total time spent: 30 mins including face to face time and time spent for planning, charting and co-ordination of care   Suzzette Righter, [place] 11/09/22    I Gardiner Coins am acting as a Education administrator for [employer]  ***

## 2022-11-13 ENCOUNTER — Ambulatory Visit: Admitting: Hematology and Oncology

## 2022-11-14 ENCOUNTER — Inpatient Hospital Stay: Attending: Hematology and Oncology | Admitting: Hematology and Oncology

## 2022-11-14 VITALS — BP 131/93 | HR 74 | Temp 97.7°F | Resp 18 | Ht 63.0 in | Wt 275.2 lb

## 2022-11-14 DIAGNOSIS — C50511 Malignant neoplasm of lower-outer quadrant of right female breast: Secondary | ICD-10-CM | POA: Insufficient documentation

## 2022-11-14 DIAGNOSIS — Z170 Estrogen receptor positive status [ER+]: Secondary | ICD-10-CM | POA: Insufficient documentation

## 2022-11-14 DIAGNOSIS — Z79810 Long term (current) use of selective estrogen receptor modulators (SERMs): Secondary | ICD-10-CM | POA: Diagnosis not present

## 2022-11-14 DIAGNOSIS — Z9189 Other specified personal risk factors, not elsewhere classified: Secondary | ICD-10-CM

## 2022-11-14 NOTE — Assessment & Plan Note (Signed)
12/05/17: Right mastectomy: Invasive ductal carcinoma 1.1 cm with lymphovascular invasion, 1/5 lymph nodes positive, grade 2, ER 90%, PR 100%, Ki-67 2%, HER-2 negative T1bN1 Stage 1B Mammaprint 12/21/2017: Luminal type A low risk Adjuvant radiation therapy completed   Plan: Tamoxifen 20 mg daily August 2019 dose reduced to 10 mg on June 19, 2019 because of severe joint pains and stiffness   Tamoxifen toxicities:  1.  Joint pains and stiffness: Much better on 10 mg dose 2. 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  She works in [employer]. Lost weight    Breast cancer surveillance: 1.  Breast exam 11/14/2022: Benign 2.  Mammogram left breast 12/04/2021: Benign breast density category C CT Chest 10/23/2022: No evidence of mets, subpleural radiation fibrosis, new 0.4 cm nodule right lower lobe nonspecific, 30-monthfollow-up recommended, severe hepatic steatosis   Return to clinic in 1 year for follow-up

## 2022-11-16 ENCOUNTER — Ambulatory Visit (HOSPITAL_COMMUNITY)
Admission: RE | Admit: 2022-11-16 | Discharge: 2022-11-16 | Disposition: A | Discharge status: Home / Self Care | Source: Ambulatory Visit | Attending: Hematology and Oncology | Admitting: Hematology and Oncology

## 2022-11-16 DIAGNOSIS — Z9189 Other specified personal risk factors, not elsewhere classified: Secondary | ICD-10-CM | POA: Insufficient documentation

## 2022-11-26 ENCOUNTER — Other Ambulatory Visit: Payer: Self-pay | Admitting: Hematology and Oncology

## 2022-12-21 ENCOUNTER — Ambulatory Visit (INDEPENDENT_AMBULATORY_CARE_PROVIDER_SITE_OTHER): Admitting: Pulmonary Disease

## 2022-12-21 DIAGNOSIS — J45991 Cough variant asthma: Secondary | ICD-10-CM

## 2022-12-21 LAB — PULMONARY FUNCTION TEST
FEF 25-75 Post: 4.46 L/sec
FEF 25-75 Pre: 3.7 L/sec
FEF2575-%Change-Post: 20 %
FEF2575-%Pred-Post: 162 %
FEF2575-%Pred-Pre: 135 %
FEV1-%Change-Post: 2 %
FEV1-%Pred-Post: 90 %
FEV1-%Pred-Pre: 88 %
FEV1-Post: 2.47 L
FEV1-Pre: 2.42 L
FEV1FVC-%Change-Post: 2 %
FEV1FVC-%Pred-Pre: 110 %
FEV6-%Change-Post: 0 %
FEV6-%Pred-Post: 80 %
FEV6-%Pred-Pre: 81 %
FEV6-Post: 2.7 L
FEV6-Pre: 2.72 L
FEV6FVC-%Pred-Post: 102 %
FEV6FVC-%Pred-Pre: 102 %
FVC-%Change-Post: 0 %
FVC-%Pred-Post: 78 %
FVC-%Pred-Pre: 79 %
FVC-Post: 2.7 L
FVC-Pre: 2.72 L
Post FEV1/FVC ratio: 91 %
Post FEV6/FVC ratio: 100 %
Pre FEV1/FVC ratio: 89 %
Pre FEV6/FVC Ratio: 100 %

## 2022-12-21 NOTE — Patient Instructions (Signed)
Spiro Pre and Post performed today. 

## 2022-12-21 NOTE — Progress Notes (Signed)
Erica Higgins Pre and Post Performed today.

## 2023-01-22 ENCOUNTER — Ambulatory Visit (HOSPITAL_COMMUNITY)
Admission: RE | Admit: 2023-01-22 | Discharge: 2023-01-22 | Disposition: A | Discharge status: Home / Self Care | Source: Ambulatory Visit | Attending: Pulmonary Disease | Admitting: Pulmonary Disease

## 2023-01-22 DIAGNOSIS — R911 Solitary pulmonary nodule: Secondary | ICD-10-CM | POA: Diagnosis present

## 2023-02-12 ENCOUNTER — Encounter: Payer: Self-pay | Admitting: Adult Health

## 2023-02-12 ENCOUNTER — Ambulatory Visit (INDEPENDENT_AMBULATORY_CARE_PROVIDER_SITE_OTHER): Admitting: Adult Health

## 2023-02-12 VITALS — BP 93/54 | HR 70 | Ht 63.0 in | Wt 272.8 lb

## 2023-02-12 DIAGNOSIS — J45991 Cough variant asthma: Secondary | ICD-10-CM | POA: Insufficient documentation

## 2023-02-12 DIAGNOSIS — R918 Other nonspecific abnormal finding of lung field: Secondary | ICD-10-CM

## 2023-02-12 MED ORDER — BENZONATATE 200 MG PO CAPS
200.0000 mg | ORAL_CAPSULE | Freq: Three times a day (TID) | ORAL | 3 refills | Status: DC | PRN
Start: 2023-02-12 — End: 2023-10-18

## 2023-02-12 NOTE — Patient Instructions (Addendum)
Begin Allegra 180mg  daily  Begin Chlorpheniramine 4mg  At bedtime. (Chlor tabs)  Saline nasal spray Twice daily  and As needed   Begin Pepcid 20mg  At bedtime   Begin Deslym 2 tsp Twice daily  for cough As needed   Tessalon 200mg  Three times a day  for cough As needed   Sips of water to soothe to throat and prevent throat clearing and coughing .  AirSupra 2 puffs every 6hr as needed  Follow up with Dr. Vassie Loll in 6-8 weeks and As needed

## 2023-02-12 NOTE — Assessment & Plan Note (Signed)
Cough variant asthma plus or minus upper airway cough syndrome complicated by reflux and postnasal drainage. Symptoms seem to be slowly improving.  Will continue control for triggers.  Along with cough control regimen. Workup showed no interstitial lung disease on high-resolution CT chest.  PFTs showed an essentially normal lung function.  Plan  Patient Instructions  Begin Allegra 180mg  daily  Begin Chlorpheniramine 4mg  At bedtime. (Chlor tabs)  Saline nasal spray Twice daily  and As needed   Begin Pepcid 20mg  At bedtime   Begin Deslym 2 tsp Twice daily  for cough As needed   Tessalon 200mg  Three times a day  for cough As needed   Sips of water to soothe to throat and prevent throat clearing and coughing .  AirSupra 2 puffs every 6hr as needed  Follow up with Dr. Vassie Loll in 6-8 weeks and As needed

## 2023-02-12 NOTE — Assessment & Plan Note (Signed)
Very small scattered lung nodules.  Serial follow-up shows no significant change.  Discussed CT results in detail.

## 2023-02-12 NOTE — Progress Notes (Signed)
@Patient  ID: Erica Higgins, female    DOB: 1971-04-21, 52 y.o.   MRN: [id number]  Chief Complaint  Patient presents with   Follow-up    Referring provider: [facility]  HPI: 52 year old female with minimum smoking history followed for cough variant asthma versus upper airway cough syndrome (symptoms started August 2023) History of persistent pneumonia unresponsive to antibiotics January 2020, hypersensitivity panel positive for A. Pullulans.  Possibly secondary to home humidifier.  Treated with prolonged steroids with resolution of symptoms and pulmonary infiltrates. 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 CT chest 11/13/2018- improvement in right upper lobe, right middle lobe opacity.  New right greater than left lung base opacity with pattern of organizing pneumonia, central clearing. CT chest 03/30/2019 resolution of lower lobe infiltrate.  Small irregular nodular densities in the lung.  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  CT chest 01/2020 minimal postradiation changes in the right lung  02/12/2023 Follow up ; Cough variant asthma , Lung nodule  Patient returns for a 41-month follow-up.  Patient is being followed for cough that developed around August 2023.  Workup has been unrevealing.  PFTs showed no airflow obstruction or restriction.  PFTs on Pihu 19, 2024 showed FEV1 at 90%, ratio 91, FVC 78%, no significant bronchodilator response. High-resolution CT chest February 2024 showed no fibrotic  interstitial lung disease minimum scarring from radiation.  And a 4 mm right lower lobe nodule.  She had a serial CT chest Jan 22, 2023 to follow lung nodules that showed stability measuring up to 4 mm.  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  Last visit she was changed from Flovent to AiR supra.  She feels like this is working well for her.  Cough is some better but not totally gone.  Seems to be worse with changes in temperature, rainy weather strong smells such as perfumes.  She denies any hemoptysis, chest pain, orthopnea, edema.     Allergies  Allergen Reactions   Cleocin [Clindamycin Hcl] Hives   Ivp Dye [Iodinated Contrast Media] Anaphylaxis   Latex Hives   Penicillins Hives   Shellfish Allergy Anaphylaxis   Adhesive [Tape]     Rash, itch     Immunization History  Administered Date(s) Administered   Influenza Split 06/04/2011   Influenza,inj,Quad PF,6+ Mos 06/30/2018    Past Medical History:  Diagnosis Date   Abdominal pain, acute, right upper quadrant    Anxiety    Asthma    Back pain    Carcinoma in situ of right breast    Depression    Eczema    Fatty liver    GERD (gastroesophageal reflux disease)    Joint pain    Migraine    Obesity    Pain    Toes on bilateral feet   Personal history of radiation therapy    Polycystic ovarian disease    PTSD (post-traumatic stress disorder)    Sleep apnea     Tobacco History: Social History   Tobacco Use  Smoking Status Former  Packs/day: 0.30   Years: 2.00   Additional pack years: 0.00   Total pack years: 0.60   Types: Cigarettes   Quit date: 09/03/1992   Years since quitting: 30.4  Smokeless Tobacco Never   Counseling given: Not Answered   Outpatient Medications Prior to Visit  Medication Sig Dispense Refill   Albuterol-Budesonide (AIRSUPRA) 90-80 MCG/ACT AERO Inhale 2 puffs into the lungs every 6 (six) hours as needed. 10.7 g 5   fluticasone (FLONASE) 50 MCG/ACT  nasal spray      oxybutynin (DITROPAN) 5 MG tablet Take by mouth.     tamoxifen (NOLVADEX) 10 MG tablet TAKE 1 TABLET BY MOUTH EVERY DAY 90 tablet 3   benzonatate (TESSALON) 200 MG capsule Take by mouth.     ipratropium (ATROVENT) 0.06 % nasal spray Place 2 sprays into both nostrils 3 (three) times daily.     pantoprazole (PROTONIX) 40 MG tablet      No facility-administered medications prior to visit.     Review of Systems:   Constitutional:   No  weight loss, night sweats,  Fevers, chills, fatigue, or  lassitude.  HEENT:   No headaches,  Difficulty swallowing,  Tooth/dental problems, or  Sore throat,                No sneezing, itching, ear ache,  +nasal congestion, post nasal drip,   CV:  No chest pain,  Orthopnea, PND, swelling in lower extremities, anasarca, dizziness, palpitations, syncope.   GI  No heartburn, indigestion, abdominal pain, nausea, vomiting, diarrhea, change in bowel habits, loss of appetite, bloody stools.   Resp:   No chest wall deformity  Skin: no rash or lesions.  GU: no dysuria, change in color of urine, no urgency or frequency.  No flank pain, no hematuria   MS:  No joint pain or swelling.  No decreased range of motion.  No back pain.    Physical Exam  BP (!) 93/54   Pulse 70   Ht 5\' 3"  (1.6 m)   Wt 272 lb 12.8 oz (123.7 kg)   SpO2 96% Comment: RA  BMI 48.32 kg/m   GEN: A/Ox3; pleasant , NAD, well nourished    HEENT:  /AT,   NOSE-clear, THROAT-clear, no lesions, no postnasal drip or exudate noted.   NECK:  Supple w/ fair ROM; no JVD; normal carotid impulses w/o bruits; no thyromegaly or nodules palpated; no lymphadenopathy.    RESP  Clear  P & A; w/o, wheezes/ rales/ or rhonchi. no accessory muscle use, no dullness to percussion  CARD:  RRR, no m/r/g, no peripheral edema, pulses intact, no cyanosis or clubbing.  GI:   Soft & nt; nml bowel sounds; no organomegaly or masses detected.   Musco: Warm bil, no deformities or joint swelling  noted.   Neuro: alert, no focal deficits noted.    Skin: Warm, no lesions or rashes    Lab Results:  CBC    BNP No results found for: "BNP"  ProBNP No results found for: "PROBNP"  Imaging: CT Chest Wo Contrast  Result Date: 01/27/2023 CLINICAL DATA:  4 mm lung nodule.  Chronic cough. EXAM: CT CHEST WITHOUT CONTRAST TECHNIQUE: Multidetector CT imaging of the chest was performed following the standard protocol without IV contrast. RADIATION DOSE REDUCTION: This exam was performed according to the departmental dose-optimization program which includes automated exposure control, adjustment of the mA and/or kV according to patient size and/or use of iterative reconstruction technique. COMPARISON:  11/16/2022, 10/22/2022. FINDINGS:  Cardiovascular: The heart is normal in size and there is no pericardial effusion. The aorta and pulmonary trunk are normal in caliber. Mediastinum/Nodes: No mediastinal or axillary lymphadenopathy. Surgical clips are present in the right axilla. Evaluation of the hila is limited due to lack of IV contrast. The thyroid gland, trachea, and esophagus are within normal limits. Lungs/Pleura: Mild subpleural fibrotic changes are noted in the anterior aspect of the right upper and middle lobes. A 3 mm nodule is present in the right lower lobe, axial image 90. The previously described 4 mm right lower pulmonary nodule is not seen on this exam there is a 4 mm nodule in the apical segment of the left upper lobe, axial image 20. No effusion or pneumothorax. Upper Abdomen: Fatty infiltration of the liver is noted. No acute abnormality. Musculoskeletal: Mastectomy changes are noted on the right. No acute or suspicious osseous abnormality. IMPRESSION: 1. Bilateral pulmonary nodules measuring up to 4 mm. No follow-up needed if patient is low-risk.This recommendation follows the consensus statement: Guidelines for Management of Incidental Pulmonary Nodules Detected on CT Images: From the  Fleischner Society 2017; Radiology 2017; 284:228-243. 2. 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 3. Hepatic steatosis. Electronically Signed   By: Thornell Sartorius M.D.   On: 01/27/2023 03:52         Latest Ref Rng & Units 12/21/2022   11:50 AM  PFT Results  FVC-Pre L 2.72   FVC-Predicted Pre % 79   FVC-Post L 2.70   FVC-Predicted Post % 78   Pre FEV1/FVC % % 89   Post FEV1/FCV % % 91   FEV1-Pre L 2.42   FEV1-Predicted Pre % 88   FEV1-Post L 2.47     No results found for: "NITRICOXIDE"      Assessment & Plan:   Cough variant asthma Cough variant asthma plus or minus upper airway cough syndrome complicated by reflux and postnasal drainage. Symptoms seem to be slowly improving.  Will continue control for triggers.  Along with cough control regimen. Workup showed no interstitial lung disease on high-resolution CT chest.  PFTs showed an essentially normal lung function.  Plan  Patient Instructions  Begin Allegra 180mg  daily  Begin Chlorpheniramine 4mg  At bedtime. (Chlor tabs)  Saline nasal spray Twice daily  and As needed   Begin Pepcid 20mg  At bedtime   Begin Deslym 2 tsp Twice daily  for cough As needed   Tessalon 200mg  Three times a day  for cough As needed   Sips of water to soothe to throat and prevent throat clearing and coughing .  AirSupra 2 puffs every 6hr as needed  Follow up with Dr. Vassie Loll in 6-8 weeks and As needed        Lung nodules Very small scattered lung nodules.  Serial follow-up shows no significant change.  Discussed CT results in detail.     Rubye Oaks, NP 02/12/2023

## 2023-04-05 ENCOUNTER — Encounter (HOSPITAL_BASED_OUTPATIENT_CLINIC_OR_DEPARTMENT_OTHER): Payer: Self-pay | Admitting: Pulmonary Disease

## 2023-04-05 ENCOUNTER — Ambulatory Visit (HOSPITAL_BASED_OUTPATIENT_CLINIC_OR_DEPARTMENT_OTHER): Admitting: Pulmonary Disease

## 2023-04-05 DIAGNOSIS — J45991 Cough variant asthma: Secondary | ICD-10-CM | POA: Diagnosis not present
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
## 2023-04-05 NOTE — Patient Instructions (Signed)
Come off CPM , then pepcid after 2 weeks

## 2023-04-07 NOTE — Assessment & Plan Note (Signed)
May have been a combination of upper airway cough syndrome and less likely cough variant asthma.  We discussed that ideally she should obtain a methacholine challenge to rule out asthma.  She has been off inhalers for 3 weeks and is feeling okay. We discussed slowly coming off chlorpheniramine and seeing if cough resurfaces and if not then discontinue Pepcid

## 2023-05-31 ENCOUNTER — Other Ambulatory Visit: Payer: Self-pay | Admitting: Hematology and Oncology

## 2023-10-10 ENCOUNTER — Ambulatory Visit (HOSPITAL_BASED_OUTPATIENT_CLINIC_OR_DEPARTMENT_OTHER): Admitting: Pulmonary Disease

## 2023-10-18 ENCOUNTER — Telehealth (HOSPITAL_BASED_OUTPATIENT_CLINIC_OR_DEPARTMENT_OTHER): Admitting: Pulmonary Disease

## 2023-10-18 ENCOUNTER — Encounter (HOSPITAL_BASED_OUTPATIENT_CLINIC_OR_DEPARTMENT_OTHER): Payer: Self-pay | Admitting: Pulmonary Disease

## 2023-10-18 DIAGNOSIS — R053 Chronic cough: Secondary | ICD-10-CM
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
## 2023-11-18 ENCOUNTER — Inpatient Hospital Stay: Attending: Hematology and Oncology | Admitting: Hematology and Oncology

## 2023-11-18 VITALS — BP 138/52 | HR 79 | Temp 98.4°F | Resp 18 | Ht 63.0 in | Wt 274.1 lb

## 2023-11-18 DIAGNOSIS — Z9011 Acquired absence of right breast and nipple: Secondary | ICD-10-CM | POA: Insufficient documentation

## 2023-11-18 DIAGNOSIS — Z1732 Human epidermal growth factor receptor 2 negative status: Secondary | ICD-10-CM | POA: Diagnosis not present

## 2023-11-18 DIAGNOSIS — C50511 Malignant neoplasm of lower-outer quadrant of right female breast: Secondary | ICD-10-CM | POA: Diagnosis not present

## 2023-11-18 DIAGNOSIS — Z170 Estrogen receptor positive status [ER+]: Secondary | ICD-10-CM | POA: Diagnosis not present

## 2023-11-18 DIAGNOSIS — Z79810 Long term (current) use of selective estrogen receptor modulators (SERMs): Secondary | ICD-10-CM | POA: Insufficient documentation

## 2023-11-18 DIAGNOSIS — Z923 Personal history of irradiation: Secondary | ICD-10-CM | POA: Diagnosis not present

## 2023-11-18 DIAGNOSIS — C50111 Malignant neoplasm of central portion of right female breast: Secondary | ICD-10-CM | POA: Diagnosis present

## 2023-11-18 NOTE — Progress Notes (Signed)
 Patient Care Team: Practice, [facility] as PCP - General Gust Brooms, DPM as Attending Physician (Podiatry) Serena Croissant, MD as Consulting Physician (Hematology and Oncology) Harriette Bouillon, MD as Consulting Physician (General Surgery) Audery Amel, MD as Referring Physician (Radiation Oncology) Loa Socks, NP as Nurse Practitioner (Hematology and Oncology) Kirstie Peri, MD as Referring Physician (Internal Medicine)  DIAGNOSIS:  Encounter Diagnosis  Name Primary?   Malignant neoplasm of lower-outer quadrant of right breast of female, estrogen receptor positive (HCC) Yes    SUMMARY OF ONCOLOGIC HISTORY: Oncology History  Malignant neoplasm of lower-outer quadrant of right breast of female, estrogen receptor positive (HCC)  11/06/2017 Initial Diagnosis   Palpable right breast mass which on radiologic evaluation was epidermoid cyst; incidentally found additional right breast mass at 8 o'clock position: 1.6 cm: IDC grade 2, ER 90%, PR 100%, Ki-67 2%, HER-2 negative, T1CN0 stage I a AJCC 8   11/25/2017 Genetic Testing   The Common Hereditary Cancer Panel offered by [facility] includes sequencing and/or deletion duplication testing of the following 47 genes: APC, ATM, AXIN2, BARD1, BMPR1A, BRCA1, BRCA2, BRIP1, CDH1, CDKN2A (p14ARF), CDKN2A (p16INK4a), CKD4, CHEK2, CTNNA1, DICER1, EPCAM (Deletion/duplication testing only), GREM1 (promoter region deletion/duplication testing only), KIT, MEN1, MLH1, MSH2, MSH3, MSH6, MUTYH, NBN, NF1, NHTL1, PALB2, PDGFRA, PMS2, POLD1, POLE, PTEN, RAD50, RAD51C, RAD51D, SDHB, SDHC, SDHD, SMAD4, SMARCA4. STK11, TP53, TSC1, TSC2, and VHL.  The following genes were evaluated for sequence changes only: SDHA and HOXB13 c.251G>A variant only.  Results: Negative, no pathogenic variants identified.  The date of this test report is 11/25/2017.    12/05/2017 Surgery   Right mastectomy: Invasive ductal carcinoma 1.1 cm with lymphovascular invasion,  1/5 lymph nodes positive, grade 2, ER 90%, PR 100%, Ki-67 2%, HER-2 negative T1bN1 Stage 1B.  Mammaprint low risk   01/31/2018 - 03/14/2018 Radiation Therapy   Adjuvant XRT at [facility]   04/2018 -  Anti-estrogen oral therapy   Tamoxifen daily   06/25/2018 Cancer Staging   Staging form: Breast, AJCC 8th Edition - Pathologic: Stage IA (pT1b, pN1, cM0, G2, ER+, PR+, HER2-) - Signed by Loa Socks, NP on 06/25/2018     CHIEF COMPLIANT: Follow-up on tamoxifen therapy  HISTORY OF PRESENT ILLNESS:   History of Present Illness The patient, with a history of breast cancer, has been on Tamoxifen for six years and is comfortable with continuing the treatment for the full ten years despite experiencing moderate hot flashes, particularly at night. She has been managing the side effects with Vitamin D. The patient also reports a history of cysts, one of which was a sebaceous cyst that has not recurred since her last mammogram. She is due for another mammogram, which she prefers to have done at the [facility].     ALLERGIES:  is allergic to cleocin [clindamycin hcl], ivp dye [iodinated contrast media], latex, penicillins, shellfish allergy, and adhesive [tape].  MEDICATIONS:  Current Outpatient Medications  Medication Sig Dispense Refill   Albuterol-Budesonide (AIRSUPRA) 90-80 MCG/ACT AERO Inhale 2 puffs into the lungs every 6 (six) hours as needed. 10.7 g 5   chlorpheniramine (CHLOR-TRIMETON) 4 MG tablet Take 4 mg by mouth 2 (two) times daily as needed for allergies.     fexofenadine (ALLEGRA) 180 MG tablet Take 180 mg by mouth daily.     fluticasone (FLONASE) 50 MCG/ACT nasal spray      oxybutynin (DITROPAN) 5 MG tablet Take by mouth.     tamoxifen (NOLVADEX) 10 MG tablet  TAKE 1 TABLET DAILY 90 tablet 3   No current facility-administered medications for this visit.    PHYSICAL EXAMINATION: ECOG PERFORMANCE STATUS: 1 - Symptomatic but completely ambulatory  Vitals:    11/18/23 0914  BP: (!) 138/52  Pulse: 79  Resp: 18  Temp: 98.4 F (36.9 C)  SpO2: 100%   Filed Weights   11/18/23 0914  Weight: 274 lb 1.6 oz (124.3 kg)    Physical Exam No palpable lumps or nodules in bilateral breasts or axilla.  Right mastectomy scar no palpable lumps or nodules  (exam performed in the presence of a chaperone)  LABORATORY DATA:  I have reviewed the data as listed    Latest Ref Rng & Units 10/15/2019   12:02 PM 09/11/2018    9:01 AM 11/13/2017    8:40 AM  CMP  Glucose 65 - 99 mg/dL 73  098  119   BUN 6 - 24 mg/dL 10  9  12    Creatinine 0.57 - 1.00 mg/dL 1.47  8.29  5.62   Sodium 134 - 144 mmol/L 143  140  139   Potassium 3.5 - 5.2 mmol/L 4.5  3.8  3.8   Chloride 96 - 106 mmol/L 105  106  104   CO2 20 - 29 mmol/L 26  27  29    Calcium 8.7 - 10.2 mg/dL 9.0  8.9  9.4   Total Protein 6.0 - 8.5 g/dL 6.3  6.9  7.4   Total Bilirubin 0.0 - 1.2 mg/dL <1.3  0.4  0.5   Alkaline Phos 39 - 117 IU/L 88  69  87   AST 0 - 40 IU/L 25  22  16    ALT 0 - 32 IU/L 23  27  22      Lab Results  Component Value Date   WBC 8.0 10/15/2019   HGB 13.4 10/15/2019   HCT 40.3 10/15/2019   MCV 90 10/15/2019   PLT 331.0 09/11/2018   NEUTROABS 4.9 10/15/2019    ASSESSMENT & PLAN:  Malignant neoplasm of lower-outer quadrant of right breast of female, estrogen receptor positive (HCC) 12/05/17: Right mastectomy: Invasive ductal carcinoma 1.1 cm with lymphovascular invasion, 1/5 lymph nodes positive, grade 2, ER 90%, PR 100%, Ki-67 2%, HER-2 negative T1bN1 Stage 1B Mammaprint 12/21/2017: Luminal type A low risk Adjuvant radiation therapy completed   Plan: Tamoxifen 20 mg daily August 2019 dose reduced to 10 mg on June 19, 2019 because of severe joint pains and stiffness   Tamoxifen toxicities:  1.  Joint pains and stiffness: Much better on 10 mg dose 2. 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  Breast exam  11/18/2023: Benign 2.  Mammogram left breast 12/04/2021: Benign breast density category C 1 patient needs a new mammogram CT Chest 01/27/2023: Bilateral pulmonary nodules 4 mm, felt to be low risk  Return to clinic in 1 year for follow-up ------------------------------------- Assessment and Plan Assessment & Plan Malignant neoplasm of lower-outer quadrant of right breast, estrogen receptor positive On tamoxifen for six years with moderate nocturnal hot flashes. Willing to continue for ten years if tolerable. Sebaceous cyst resolved. - Continue tamoxifen 10 mg oral daily. - [facility] for the second week of Xela.  At high risk for coronary artery disease No new symptoms. Risk factors monitored.      Orders Placed This Encounter  Procedures   MM DIAG BREAST TOMO  BILATERAL    Standing Status:   Future    Expiration Date:   11/17/2024    Reason for Exam (SYMPTOM  OR DIAGNOSIS REQUIRED):   annual mammograms    Is the patient pregnant?:   No    [facility]    Release to patient:   Immediate   MM DIAG BREAST TOMO UNI LEFT    [employer] Pf; : 2024 @ [facility]/ no needs/ no issues/  yes hx of br cancer/ no implants/  no reductions/ spoke iwht pt/ fj    Standing Status:   Future    Expected Date:   12/19/2023    Expiration Date:   11/17/2024    Reason for Exam (SYMPTOM  OR DIAGNOSIS REQUIRED):   annual mammograms    Is the patient pregnant?:   No    Preferred imaging location?:   [facility]    Release to patient:   Immediate   The patient has a good understanding of the overall plan. she agrees with it. she will call with any problems that may develop before the next visit here. Total time spent: 30 mins including face to face time and time spent for planning, charting and co-ordination of care   Tamsen Meek, MD 11/18/23

## 2023-11-18 NOTE — Assessment & Plan Note (Signed)
 12/05/17: Right mastectomy: Invasive ductal carcinoma 1.1 cm with lymphovascular invasion, 1/5 lymph nodes positive, grade 2, ER 90%, PR 100%, Ki-67 2%, HER-2 negative T1bN1 Stage 1B Mammaprint 12/21/2017: Luminal type A low risk Adjuvant radiation therapy completed   Plan: Tamoxifen 20 mg daily August 2019 dose reduced to 10 mg on June 19, 2019 because of severe joint pains and stiffness   Tamoxifen toxicities:  1.  Joint pains and stiffness: Much better on 10 mg dose 2. 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  Breast exam 11/18/2023: Benign 2.  Mammogram left breast 12/04/2021: Benign breast density category C 1 patient needs a new mammogram CT Chest 01/27/2023: Bilateral pulmonary nodules 4 mm, felt to be low risk  Return to clinic in 1 year for follow-up

## 2023-11-28 ENCOUNTER — Ambulatory Visit (INDEPENDENT_AMBULATORY_CARE_PROVIDER_SITE_OTHER): Admitting: Podiatry

## 2023-11-28 ENCOUNTER — Encounter: Payer: Self-pay | Admitting: Podiatry

## 2023-11-28 ENCOUNTER — Ambulatory Visit (INDEPENDENT_AMBULATORY_CARE_PROVIDER_SITE_OTHER)

## 2023-11-28 DIAGNOSIS — L600 Ingrowing nail: Secondary | ICD-10-CM | POA: Diagnosis not present

## 2023-11-28 DIAGNOSIS — M779 Enthesopathy, unspecified: Secondary | ICD-10-CM

## 2023-11-28 MED ORDER — DIAZEPAM 5 MG PO TABS: 5.0000 mg | ORAL_TABLET | Freq: Two times a day (BID) | ORAL | 0 refills | Status: DC | PRN

## 2023-11-28 NOTE — Progress Notes (Signed)
  Subjective:  Patient ID: Erica Higgins, female    DOB: [birth year],  MRN: [id number]  Chief Complaint  Patient presents with   Ingrown Toenail    RM#11 Bilateral big toe ingrown nails.    Discussed the use of AI scribe software for clinical note transcription with the patient, who gave verbal consent to proceed.  History of Present Illness The patient presents with bilateral ingrown toenails on the big toes. She has been dealing with this issue for a while, but has not sought any treatment. She has been managing the condition with regular pedicures every two to three weeks. The patient reports pain, especially where the nail grows in, and discomfort when the toe is bumped. She denies any drainage or fluid coming from the toenails. The patient also reports feeling nauseous due to anxiety about the impending treatment. She has no history of diabetes or blood thinners.      Objective:    Physical Exam General: AAO x3, NAD  Dermatological: Incurvation present to the bilateral hallux toenails with localized edema and erythema more from inflammation as opposed to infection.  No drainage or pus or any ascending cellulitis.  There is no open lesions identified at this time.  Vascular: Dorsalis Pedis artery and Posterior Tibial artery pedal pulses are 2/4 bilateral with immedate capillary fill time.  There is no pain with calf compression, swelling, warmth, erythema.   Neruologic: Grossly intact via light touch bilateral.   Musculoskeletal: Tenderness on ingrown toenails.  No other areas of discomfort.    No images are attached to the encounter.    Results     Assessment:   Ingrown toenails bilateral hallux; bone spur  Plan:  Patient was evaluated and treated and all questions answered.  Assessment and Plan Assessment & Plan Ingrown toenails Bilateral ingrown toenails on halluces, painful with potential bone spurs. Discussed treatment options of conservative as well as surgical.   I do think starting with removal of ingrown toenails with chemical matricectomy will be helpful but discussed with her if it were to recur may need to proceed with exostectomy, nail removal.  She agrees with having that once removed.  However she is nervous to have this done today.  Number to reschedule her Partial nail avulsions performed.  Prescribed diazepam to take prior to coming in for the procedure.  We discussed the procedure as well as postoperative course.     Radiology -X-rays obtained reviewed of bilateral feet.  3 views were obtained.  Lateral view there is dorsal spurring, prominence noted on the distal phalanx dorsally.   No follow-ups on file.  Vivi Barrack DPM

## 2023-11-28 NOTE — Patient Instructions (Signed)

## 2024-01-03 ENCOUNTER — Encounter

## 2024-01-03 ENCOUNTER — Ambulatory Visit (INDEPENDENT_AMBULATORY_CARE_PROVIDER_SITE_OTHER): Admitting: Podiatry

## 2024-01-03 DIAGNOSIS — L600 Ingrowing nail: Secondary | ICD-10-CM | POA: Diagnosis not present

## 2024-01-03 MED ORDER — SULFAMETHOXAZOLE-TRIMETHOPRIM 800-160 MG PO TABS: 1.0000 | ORAL_TABLET | Freq: Two times a day (BID) | ORAL | 0 refills | Status: DC

## 2024-01-03 NOTE — Patient Instructions (Signed)

## 2024-01-07 NOTE — Progress Notes (Signed)
 Subjective: Chief Complaint  Patient presents with   Ingrown Toenail    RM#11 Bilateral big toe nail removal   53 year old female presents the office today to have her ingrown toenails removed from both big toes.  She decides she wants to proceed with this.  They do cause pain.  She denies any drainage or pus at this time.  She has no other concerns or changes since she was last seen.   Objective: AAO x3, NAD DP/PT pulses palpable bilaterally, CRT less than 3 seconds Ingrown toenails present to both the medial and lateral aspects of bilateral hallux with localized edema on the nail borders but there is no erythema, drainage or any obvious signs of infection noted today. No pain with calf compression, swelling, warmth, erythema  Assessment: Ingrown toenails bilateral hallux  Plan: -All treatment options discussed with the patient including all alternatives, risks, complications.  -At this time, the patient is requesting partial nail removal with chemical matricectomy to the symptomatic portion of the nail. Risks and complications were discussed with the patient for which they understand and written consent was obtained. Under sterile conditions a total of 3 mL of a mixture of 2% lidocaine  plain and 0.5% Marcaine  plain was infiltrated in a hallux block fashion. Once anesthetized, the skin was prepped in sterile fashion. A tourniquet was then applied. Next the medial, lateral aspect of hallux nail border was then sharply excised making sure to remove the entire offending nail border. Once the nails were ensured to be removed area was debrided and the underlying skin was intact. There is no purulence identified in the procedure. Next phenol was then applied under standard conditions and copiously irrigated.  Silvadene was applied. A dry sterile dressing was applied. After application of the dressing the tourniquet was removed and there is found to be an immediate capillary refill time to the digit.  The patient tolerated the procedure well any complications. Post procedure instructions were discussed the patient for which he verbally understood. Discussed signs/symptoms of infection and directed to call the office immediately should any occur or go directly to the emergency room. In the meantime, encouraged to call the office with any questions, concerns, changes symptoms. -Patient encouraged to call the office with any questions, concerns, change in symptoms.   Charity Conch DPM

## 2024-01-10 ENCOUNTER — Ambulatory Visit

## 2024-01-10 ENCOUNTER — Encounter

## 2024-01-10 ENCOUNTER — Ambulatory Visit
Admission: RE | Admit: 2024-01-10 | Discharge: 2024-01-10 | Disposition: A | Discharge status: Home / Self Care | Source: Ambulatory Visit | Attending: Hematology and Oncology

## 2024-01-10 DIAGNOSIS — C50511 Malignant neoplasm of lower-outer quadrant of right female breast: Secondary | ICD-10-CM

## 2024-01-24 ENCOUNTER — Ambulatory Visit: Admitting: Podiatry

## 2024-01-24 DIAGNOSIS — L600 Ingrowing nail: Secondary | ICD-10-CM

## 2024-01-27 NOTE — Progress Notes (Unsigned)
 Erica Higgins

## 2024-02-24 ENCOUNTER — Encounter: Payer: Self-pay | Admitting: Neurology

## 2024-03-05 NOTE — Progress Notes (Signed)
 NEUROLOGY CONSULTATION NOTE  Erica Higgins MRN: [id number] DOB: June 05, 1971  Referring provider: Devere Clap, AGNP-C Primary care provider: Mardy Kiss, PA-C  Reason for consult:  headaches  Assessment/Plan:   Cervicogenic headache/migraine - may be aggravated by left shoulder pain Migraine with aura, without status migrainosus, not intractable   Start physical therapy for left shoulder pain as prescribed Migraine prevention:  increase topiramate  to 50mg  at bedtime.  We can increase dose in 6 weeks if needed. Migraine rescue:  She will try Ubrelvy 100mg  samples.  Zofran  4mg  if needed. Limit use of pain relievers to no more than 9 days out of the month to prevent risk of rebound or medication-overuse headache. Keep headache diary Follow up 8 months.    Subjective:  Erica Higgins is a 53 year old right-handed female with asthma, fatty liver, sleep apnea, PCOS, PTSD and history of right breast cancer who presents for headache.  History supplemented by prior neurologist's and referring provider's notes.   History of migraines since her early 59s, described as right frontal pounding/throbbing pain preceded by a film over her right eye.  Associated with nausea, photophobia, phonophobia.  If treated early with Tylenol  or Motrin , lasts 4  hours.  Occurs 1 every few months.  MRI of brain with and without contrast on 11/28/2019, personally reviewed, revealed a single punctate T2/FLAIR hyperintensity within the left hemispheric subcortical white matter but otherwise unremarkable.    In 2024, she developed right eye pain.  It was determined that it was due to having wrong prescription for her glasses.  Once it was fixed, the eye pain resolved.  In early 2025, she developed 5/10 left sided headaches that start as an ache and progresses to throbbing, that start in the back of the head and radiates to the front.  She does have some left sided neck and shoulder pain.  Sometimes the left arm gets  numb.  Associated with some nausea but no visual disturbance, photophobia and phonophobia.  They last all day.  Treats with 500mg  Tylenol .  Occurs every day to every other day.  She saw an orthopedist who sent her for physical therapy.  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  Follow up MRI of brain without contrast on 11/08/2023 confirmed osteoma, unchanged, but otherwise unremarkable.  Past NSAIDS/analgesics:  tramadol , ibuprofen  800mg  Past abortive triptans:  Zomig-ZMT 5mg , sumatriptan tab Past abortive ergotamine:  none Past muscle relaxants:  baclofen 20mg  Past anti-emetic:  none Past antihypertensive medications:  none Past antidepressant medications:  Lexapro , Prozac Past anticonvulsant medications:  gabapentin  Past anti-CGRP:  none Past vitamins/Herbal/Supplements:  none Past antihistamines/decongestants:  Flonase, Claritin Other past therapies:  none  Current NSAIDS/analgesics: Tylenol , Motrin  Current triptans:  rizatriptan  10mg  Current ergotamine:  none Current anti-emetic:  Zofran  4mg  Current muscle relaxants:  none Current Antihypertensive medications:  none Current Antidepressant medications:  none Current Anticonvulsant medications:  topiramate  25mg  at bedtime Current anti-CGRP:  none Current Vitamins/Herbal/Supplements:  MVI, D Current Antihistamines/Decongestants:  none Other therapy:  none Other medications:  tamoxifen    Caffeine:  no more than 1 soda a day.  No coffee Alcohol:  once a month Smoker:  no Diet:  tries to drink minimum 64 oz water.  Herbal tea.  Fast food. Exercise:  trying to get back to walking daily Depression:  no; Anxiety:  stable Other pain:  left shoulder pain, sciatica Sleep hygiene:  good except for  shoulder pain  History of head trauma/concussion:  Possibly while in the [employer] in 1996-1997.   Family history of headache:  not to her knowledge Family  history of aneurysms:  father (AAA)   PAST MEDICAL HISTORY: Past Medical History:  Diagnosis Date   Abdominal pain, acute, right upper quadrant    Anxiety    Asthma    Back pain    Carcinoma in situ of right breast    Depression    Eczema    Fatty liver    GERD (gastroesophageal reflux disease)    Joint pain    Migraine    Obesity    Pain    Toes on bilateral feet   Personal history of radiation therapy    Polycystic ovarian disease    PTSD (post-traumatic stress disorder)    Sleep apnea     PAST SURGICAL HISTORY: Past Surgical History:  Procedure Laterality Date   CESAREAN SECTION     MASTECTOMY Right 12/2017   MINISCUS REPAIR  2003 and 2005   SIMPLE MASTECTOMY WITH AXILLARY SENTINEL NODE BIOPSY Right 12/05/2017   Procedure: SIMPLE MASTECTOMY WITH WIRE LOCALIZATION AND  TARGETED RIGHT AXILLARY SENTINEL NODE MAPPING  AND BIOPSY ERAS PATHWAY;  Surgeon: Vanderbilt Ned, MD;  Location: [facility];  Service: General;  Laterality: Right;  PECTORAL BLOCK   TONSILLECTOMY  09/03/1994    MEDICATIONS: Current Outpatient Medications on File Prior to Visit  Medication Sig Dispense Refill   Albuterol-Budesonide (AIRSUPRA ) 90-80 MCG/ACT AERO Inhale 2 puffs into the lungs every 6 (six) hours as needed. 10.7 g 5   chlorpheniramine (CHLOR-TRIMETON) 4 MG tablet Take 4 mg by mouth 2 (two) times daily as needed for allergies.     diazepam  (VALIUM ) 5 MG tablet Take 1 tablet (5 mg total) by mouth every 12 (twelve) hours as needed for anxiety. 2 tablet 0   fexofenadine (ALLEGRA) 180 MG tablet Take 180 mg by mouth daily.     fluticasone (FLONASE) 50 MCG/ACT nasal spray      oxybutynin (DITROPAN) 5 MG tablet Take by mouth.     sulfamethoxazole -trimethoprim  (BACTRIM  DS) 800-160 MG tablet Take 1 tablet by mouth 2 (two) times daily. 14 tablet 0   tamoxifen  (NOLVADEX ) 10 MG tablet TAKE 1 TABLET DAILY 90 tablet 3   No current facility-administered medications on file prior to visit.     ALLERGIES: Allergies  Allergen Reactions   Cleocin  [Clindamycin  Hcl] Hives   Ivp Dye [Iodinated Contrast Media] Anaphylaxis   Latex Hives   Penicillins Hives   Shellfish Allergy Anaphylaxis   Adhesive [Tape]     Rash, itch     FAMILY HISTORY: Family History  Problem Relation Age of Onset   Allergies Mother    Hypertension Mother    Rheum arthritis Mother    Osteoporosis Mother    Skin cancer Mother    Emphysema Father        smoker   Allergies Father    Hyperlipidemia Father    Hypertension Father    Alcohol abuse Father    Colon cancer Paternal Aunt        dx 50's/60's   Colon cancer Paternal Uncle 62   Lung cancer Paternal Uncle    Pancreatic cancer Maternal Grandmother 65   Skin cancer Paternal Grandmother    Heart attack Paternal Grandfather 91   Breast cancer Maternal Great-grandmother    Esophageal cancer Neg Hx    Stomach cancer Neg Hx    Ulcerative colitis Neg  Hx     Objective:  Blood pressure 129/82, pulse 79, height 5' 3 (1.6 m), weight 270 lb 6.4 oz (122.7 kg), SpO2 96%. General: No acute distress.  Patient appears well-groomed.   Head:  Normocephalic/atraumatic Eyes:  fundi examined but not visualized Neck: supple, no paraspinal tenderness, full range of motion Shoulder:  tenderness to palpation of left subacromial space Back: No paraspinal tenderness Heart: regular rate and rhythm Neurological Exam: Mental status: alert and oriented to person, place, and time, speech fluent and not dysarthric, language intact. Cranial nerves: CN I: not tested CN II: pupils equal, round and reactive to light, visual fields intact CN III, IV, VI:  full range of motion, no nystagmus, no ptosis CN V: facial sensation intact. CN VII: upper and lower face symmetric CN VIII: hearing intact CN IX, X: gag intact, uvula midline CN XI: sternocleidomastoid and trapezius muscles intact CN XII: tongue midline Bulk & Tone: normal, no fasciculations. Motor:  muscle  strength 5/5 throughout Sensation:  Pinprick, temperature and vibratory sensation intact. Deep Tendon Reflexes:  2+ throughout,  toes downgoing.   Finger to nose testing:  Without dysmetria.   Gait:  Normal station and stride.  Romberg negative.    Thank you for allowing me to take part in the care of this patient.  Juliene Dunnings, DO  CC:  Mardy Kiss, PA-C  Devere Clap, AGNP-C

## 2024-03-09 ENCOUNTER — Ambulatory Visit (INDEPENDENT_AMBULATORY_CARE_PROVIDER_SITE_OTHER): Admitting: Neurology

## 2024-03-09 ENCOUNTER — Encounter: Payer: Self-pay | Admitting: Neurology

## 2024-03-09 VITALS — BP 129/82 | HR 79 | Ht 63.0 in | Wt 270.4 lb

## 2024-03-09 DIAGNOSIS — G4486 Cervicogenic headache: Secondary | ICD-10-CM

## 2024-03-09 DIAGNOSIS — G43109 Migraine with aura, not intractable, without status migrainosus: Secondary | ICD-10-CM | POA: Diagnosis not present

## 2024-03-09 MED ORDER — TOPIRAMATE 50 MG PO TABS: 50.0000 mg | ORAL_TABLET | Freq: Every day | ORAL | 5 refills | Status: AC

## 2024-03-09 NOTE — Patient Instructions (Signed)
  Start physical therapy as prescribed Increase topiramate  to 50mg  at bedtime.  Contact us  in 6 weeks with update and we can increase dose if needed. Take Ubrelvy 100mg  at earliest onset of headache.  May repeat dose once in 2 hours if needed.  Maximum 2 tablets in 24 hours. Limit use of pain relievers to no more than 9 days out of the month.  These medications include acetaminophen , NSAIDs (ibuprofen /Advil /Motrin , naproxen/Aleve, triptans (Imitrex/sumatriptan), Excedrin, and narcotics.  This will help reduce risk of rebound headaches. Be aware of common food triggers Routine exercise Stay adequately hydrated (aim for 64 oz water daily) Keep headache diary Maintain proper stress management Maintain proper sleep hygiene Do not skip meals

## 2024-05-20 ENCOUNTER — Emergency Department (HOSPITAL_COMMUNITY): Admission: EM | Admit: 2024-05-20 | Discharge: 2024-05-20 | Disposition: A | Discharge status: Home / Self Care

## 2024-05-20 ENCOUNTER — Encounter: Payer: Self-pay | Admitting: Neurology

## 2024-05-20 ENCOUNTER — Encounter (HOSPITAL_COMMUNITY): Payer: Self-pay

## 2024-05-20 ENCOUNTER — Other Ambulatory Visit: Payer: Self-pay

## 2024-05-20 ENCOUNTER — Emergency Department (HOSPITAL_COMMUNITY)

## 2024-05-20 DIAGNOSIS — R200 Anesthesia of skin: Secondary | ICD-10-CM | POA: Insufficient documentation

## 2024-05-20 DIAGNOSIS — R519 Headache, unspecified: Secondary | ICD-10-CM | POA: Diagnosis present

## 2024-05-20 DIAGNOSIS — Z91040 Latex allergy status: Secondary | ICD-10-CM | POA: Insufficient documentation

## 2024-05-20 DIAGNOSIS — Z853 Personal history of malignant neoplasm of breast: Secondary | ICD-10-CM | POA: Insufficient documentation

## 2024-05-20 LAB — CBC WITH DIFFERENTIAL/PLATELET
Abs Immature Granulocytes: 0.04 K/uL (ref 0.00–0.07)
Basophils Absolute: 0.1 K/uL (ref 0.0–0.1)
Basophils Relative: 1 %
Eosinophils Absolute: 0.1 K/uL (ref 0.0–0.5)
Eosinophils Relative: 1 %
HCT: 42 % (ref 36.0–46.0)
Hemoglobin: 13.9 g/dL (ref 12.0–15.0)
Immature Granulocytes: 0 %
Lymphocytes Relative: 33 %
Lymphs Abs: 3.4 K/uL (ref 0.7–4.0)
MCH: 30 pg (ref 26.0–34.0)
MCHC: 33.1 g/dL (ref 30.0–36.0)
MCV: 90.5 fL (ref 80.0–100.0)
Monocytes Absolute: 0.7 K/uL (ref 0.1–1.0)
Monocytes Relative: 7 %
Neutro Abs: 6 K/uL (ref 1.7–7.7)
Neutrophils Relative %: 58 %
Platelets: 288 K/uL (ref 150–400)
RBC: 4.64 MIL/uL (ref 3.87–5.11)
RDW: 12.2 % (ref 11.5–15.5)
WBC: 10.3 K/uL (ref 4.0–10.5)
nRBC: 0 % (ref 0.0–0.2)

## 2024-05-20 LAB — COMPREHENSIVE METABOLIC PANEL WITH GFR
ALT: 29 U/L (ref 0–44)
AST: 26 U/L (ref 15–41)
Albumin: 3.8 g/dL (ref 3.5–5.0)
Alkaline Phosphatase: 74 U/L (ref 38–126)
Anion gap: 12 (ref 5–15)
BUN: 11 mg/dL (ref 6–20)
CO2: 26 mmol/L (ref 22–32)
Calcium: 9.2 mg/dL (ref 8.9–10.3)
Chloride: 104 mmol/L (ref 98–111)
Creatinine, Ser: 0.8 mg/dL (ref 0.44–1.00)
GFR, Estimated: >60 mL/min (ref >60)
Glucose, Bld: 117 mg/dL — ABNORMAL HIGH (ref 70–99)
Potassium: 3.7 mmol/L (ref 3.5–5.1)
Sodium: 142 mmol/L (ref 135–145)
Total Bilirubin: 0.6 mg/dL (ref 0.0–1.2)
Total Protein: 7.6 g/dL (ref 6.5–8.1)

## 2024-05-20 MED ORDER — DIPHENHYDRAMINE HCL 50 MG/ML IJ SOLN
12.5000 mg | Freq: Once | INTRAMUSCULAR | Status: DC
  Filled 2024-05-20: qty 1

## 2024-05-20 MED ORDER — PROCHLORPERAZINE EDISYLATE 10 MG/2ML IJ SOLN
10.0000 mg | Freq: Once | INTRAMUSCULAR | Status: DC
  Filled 2024-05-20: qty 2

## 2024-05-20 MED ORDER — KETOROLAC TROMETHAMINE 15 MG/ML IJ SOLN: 15.0000 mg | Freq: Once | INTRAMUSCULAR | Status: DC

## 2024-05-20 MED ORDER — NAPROXEN 250 MG PO TABS
500.0000 mg | ORAL_TABLET | Freq: Once | ORAL | Status: AC
  Administered 2024-05-20: 500 mg via ORAL
  Filled 2024-05-20: qty 2

## 2024-05-20 NOTE — ED Provider Notes (Signed)
 [place] EMERGENCY DEPARTMENT AT [facility] Provider Note   CSN: [id number] Arrival date & time: 05/20/24  1704     Patient presents with: Headache   Erica Higgins is a 53 y.o. female.  She has history of migraines, breast cancer status post treatment, GERD.  She presents ER today complaining of a left-sided headache that has been ongoing for the past 4 days that seems to wax and wane, feels different than her usual migraines as she does not have any nausea or photophobia with this and did not have an aura.  She is also been having some intermittent numbness of the right cheek that is very focal that has been intermittent for the past 4 days but is persistent today.  She states it feels like she had dental work done but she has not had anything done.  She talked to PA at work who she states examined her and did not notice any abnormalities on her exam and that she follow-up with her PCP who recommended come to the ER for evaluation to rule out a head bleed.  Patient denies sudden onset headache, severe headache, vomiting, fevers or neck stiffness.  She does follow with neurology for migraines and    Headache      Prior to Admission medications   Medication Sig Start Date End Date Taking? Authorizing Provider  Albuterol-Budesonide (AIRSUPRA ) 90-80 MCG/ACT AERO Inhale 2 puffs into the lungs every 6 (six) hours as needed. 11/02/22   Jude Harden GAILS, MD  chlorpheniramine (CHLOR-TRIMETON) 4 MG tablet Take 4 mg by mouth 2 (two) times daily as needed for allergies.    [provider]  fexofenadine (ALLEGRA) 180 MG tablet Take 180 mg by mouth daily. Patient taking differently: Take 180 mg by mouth as needed.    [provider]  fluticasone OREN) 50 MCG/ACT nasal spray  10/03/22   [provider]  metFORMIN (GLUCOPHAGE-XR) 500 MG 24 hr tablet Take 500 mg by mouth 2 (two) times daily with a meal. 02/21/24   [provider]  oxybutynin (DITROPAN) 5 MG  tablet Take by mouth. 05/14/22   [provider]  rizatriptan  (MAXALT ) 10 MG tablet Take 10 mg by mouth as needed for migraine. 02/05/24   [provider]  tamoxifen  (NOLVADEX ) 10 MG tablet TAKE 1 TABLET DAILY 05/31/23   Gudena, Vinay, MD  topiramate  (TOPAMAX ) 50 MG tablet Take 1 tablet (50 mg total) by mouth at bedtime. 03/09/24   Skeet Juliene SAUNDERS, DO    Allergies: Cleocin  [clindamycin  hcl], Ivp dye [iodinated contrast media], Latex, Penicillins, Shellfish allergy, and Adhesive [tape]    Review of Systems  Neurological:  Positive for headaches.    Updated Vital Signs BP (!) 144/75 (BP Location: Left Arm)   Pulse 65   Temp 99.1 F (37.3 C) (Oral)   Resp 18   Ht 5' 3 (1.6 m)   Wt 122.5 kg   SpO2 100%   BMI 47.83 kg/m   Physical Exam Vitals and nursing note reviewed.  Constitutional:      General: She is not in acute distress.    Appearance: She is well-developed.  HENT:     Head: Normocephalic and atraumatic.  Eyes:     General: No visual field deficit.    Conjunctiva/sclera: Conjunctivae normal.  Cardiovascular:     Rate and Rhythm: Normal rate and regular rhythm.     Heart sounds: No murmur heard. Pulmonary:     Effort: Pulmonary effort is normal. No  respiratory distress.     Breath sounds: Normal breath sounds.  Abdominal:     Palpations: Abdomen is soft.     Tenderness: There is no abdominal tenderness.  Musculoskeletal:        General: No swelling.     Cervical back: Neck supple.  Skin:    General: Skin is warm and dry.     Capillary Refill: Capillary refill takes less than 2 seconds.  Neurological:     Mental Status: She is alert and oriented to person, place, and time.     GCS: GCS eye subscore is 4. GCS verbal subscore is 5. GCS motor subscore is 6.     Cranial Nerves: No cranial nerve deficit, dysarthria or facial asymmetry.     Gait: Gait normal.     Comments: Decreased sensation over right zygomatic area no decrease sensation of forehead, ear  or mandible  Psychiatric:        Mood and Affect: Mood normal.     (all labs ordered are listed, but only abnormal results are displayed) Labs Reviewed  CBC WITH DIFFERENTIAL/PLATELET  COMPREHENSIVE METABOLIC PANEL WITH GFR    EKG: None  Radiology: CT Head Wo Contrast Result Date: 05/20/2024 CLINICAL DATA:  Right-sided headache with right sided cheek numbness. EXAM: CT HEAD WITHOUT CONTRAST TECHNIQUE: Contiguous axial images were obtained from the base of the skull through the vertex without intravenous contrast. RADIATION DOSE REDUCTION: This exam was performed according to the departmental dose-optimization program which includes automated exposure control, adjustment of the mA and/or kV according to patient size and/or use of iterative reconstruction technique. COMPARISON:  September 13, 2023 FINDINGS: Brain: No evidence of acute infarction, hemorrhage, hydrocephalus, extra-axial collection or mass lesion/mass effect. Vascular: No hyperdense vessel or unexpected calcification. Skull: A small, stable, benign-appearing osseous protrusion is seen along the outer table of the skull within the right parietal region. Sinuses/Orbits: No acute finding. Other: None. IMPRESSION: No acute intracranial abnormality. Electronically Signed   By: Suzen Dials M.D.   On: 05/20/2024 19:27     Procedures   Medications Ordered in the ED - No data to display                                  Medical Decision Making Differential diagnosis includes but limited to migraine, CVA, intracranial hemorrhage, other  ED course: Patient here for left-sided headache on and off for several days that does not feel your typical migraine.  She has a normal neurologic exam.  No red flag symptoms of fever, vision changes, weakness, sudden onset, blood thinner use, neck stiffness.    Imaging: Head CT was ordered shows no acute intracranial hemorrhage, no CVA or other acute findings.  Stable osteoma was noted.  I  viewed and interpreted the imaging independently, I agree with radiology read.  On exam patient does have decree sensation of her right cheek and was concerned because it has been intermittent but was more constant today.  Given that she has had symptoms for days I discussed with patient that it is suspected that if she did have a stroke that would likely show up on CT.  I discussed with her my plan to order a migraine cocktail to see if this resolves her headache as well as her numbness and plan to reassess.  Patient subsequently declined any further workup or treatment, but wanted only p.o. medicine to take her [place] at home.  She already sees a neurologist as at the message and plans to follow-up.  Given reassuring exam, duration of symptoms and reassuring vitals and normal head CT I think this is not unreasonable.  She was asked to come back to the ER for new or worsening symptoms.  Amount and/or Complexity of Data Reviewed Labs: ordered. Radiology: ordered.  Risk Prescription drug management.        Final diagnoses:  None    ED Discharge Orders     None          Kweli Grassel A, PA-C 05/20/24 2326    Kammerer, Megan L, DO 05/21/24 1752

## 2024-05-20 NOTE — Discharge Instructions (Signed)
 Pleasure taking care of you today.  You are seen for a headache and facial numbness.  Your head CT and labs are normal.  Have a reassuring neurologic exam.  Please follow-up with your neurologist.  Come back to the ER for new or worsening symptoms.

## 2024-05-20 NOTE — ED Triage Notes (Addendum)
 Reports severe left sided headache x 4 days and right cheek numbness x 4 days.  Reports the numbness has been coming and going but persistent today.  Denies unilateral weakness, blurred vision speech changes   Known osteoma but last scan in jan measuring 1.3cm

## 2024-05-20 NOTE — ED Notes (Signed)
Ambulatory to tx room  

## 2024-05-20 NOTE — ED Notes (Signed)
 ED Provider at bedside.

## 2024-05-21 ENCOUNTER — Telehealth: Payer: Self-pay | Admitting: Neurology

## 2024-05-21 NOTE — Telephone Encounter (Signed)
 See mychart.

## 2024-05-21 NOTE — Telephone Encounter (Signed)
 Pt called in this morning and she stated that she did go to the emergency room last night due to cheek numbness. Pt stated that she has had cheek numbness for 4 days and she has had a headache for 4 days on her right side of her head. Pt stated that the doctor at the emergency room wanted her to contact her Neurologist due to all the problems she has been having. Pt also sent a message through the Mychart.  Pt stated that she could see a PA as well. Thanks

## 2024-05-27 NOTE — Progress Notes (Unsigned)
 NEUROLOGY FOLLOW UP OFFICE NOTE  Erica Higgins [id number]  Assessment/Plan:   Likely new presentation of a migraine with aura, however, given new and persistent symptoms, it is reasonable to rule out secondary intracranial etiology.    Check MRI of brain without contrast. Migraine prevention:  increase topiramate  to 50mg  at bedtime.  We can increase dose in 6 weeks if needed. Migraine rescue:  Will send in prescription for Ubrelvy  100mg  as it is effective.  Zofran  4mg  if needed. Limit use of pain relievers to no more than 9 days out of the month to prevent risk of rebound or medication-overuse headache. Keep headache diary Follow up in March as scheduled.    Subjective:  Erica Higgins is a 53 year old right-handed female with asthma, fatty liver, sleep apnea, PCOS, PTSD and history of right breast cancer whom I see for headaches, presents today for facial numbness.  ED notes reviewed.  UPDATE: Last seen in July.  Topiramate  was increased from 25mg  to 50mg  at bedtime. Decided not to increase dose and instead modify her diet which was helpful.  She had one or two mild-moderate headaches and only one migraine.  Then, last week, she had another left sided migraine headache but different in that it was associated with paresthesias of the right cheek.  Didn't have the arm numbness or nausea.  She treated with Allegra, Flonase and Tylenol .  It persisted for 4 days, so she went to the ED on 9/17 for further evaluation.  CT head revealed no acute intracranial abnormality.  She was treated with a migraine cocktail.  She subsequently took a Ubrelvy  sample which helped and the headache resolved the next day.  However, she has continued to experience the paresthesias of the right cheek, although it is mild and gradually improving.  She does report increased emotional stress and had poor diet at that time, which may have been triggers.  Current NSAIDS/analgesics: Tylenol , Motrin  Current triptans:   none Current ergotamine:  none Current anti-emetic:  Zofran  4mg  Current muscle relaxants:  none Current Antihypertensive medications:  none Current Antidepressant medications:  none Current Anticonvulsant medications:  topiramate  25mg  at bedtime Current anti-CGRP:  none Current Vitamins/Herbal/Supplements:  MVI, D Current Antihistamines/Decongestants:  none Other therapy:  none Other medications:  tamoxifen    Caffeine:  no more than 1 soda a day.  No coffee Alcohol:  once a month Smoker:  no Diet:  tries to drink minimum 64 oz water.  Herbal tea.  Fast food. Exercise:  trying to get back to walking daily Depression:  no; Anxiety:  stable Other pain:  left shoulder pain, sciatica Sleep hygiene:  good except for shoulder pain   HISTORY: History of migraines since her early 40s, described as right frontal pounding/throbbing pain preceded by a film over her right eye.  Associated with nausea, photophobia, phonophobia.  If treated early with Tylenol  or Motrin , lasts 4  hours.  Occurs 1 every few months.  MRI of brain with and without contrast on 11/28/2019, personally reviewed, revealed a single punctate T2/FLAIR hyperintensity within the left hemispheric subcortical white matter but otherwise unremarkable.    In 2024, she developed right eye pain.  It was determined that it was due to having wrong prescription for her glasses.  Once it was fixed, the eye pain resolved.  In early 2025, she developed 5/10 left sided headaches that start as an ache and progresses to throbbing, that start in the back of the head and radiates to the front.  She does have some left sided neck and shoulder pain.  Sometimes the left arm gets numb.  Associated with some nausea but no visual disturbance, photophobia and phonophobia.  They last all day.  Treats with 500mg  Tylenol .  Occurs every day to every other day.  She saw an orthopedist who sent her for physical therapy.  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  Follow up MRI of brain without contrast on 11/08/2023 confirmed osteoma, unchanged, but otherwise unremarkable.  Past NSAIDS/analgesics:  tramadol , ibuprofen  800mg  Past abortive triptans:  Zomig-ZMT 5mg , sumatriptan tab, rizatriptan  Past abortive ergotamine:  none Past muscle relaxants:  baclofen 20mg  Past anti-emetic:  none Past antihypertensive medications:  none Past antidepressant medications:  Lexapro , Prozac Past anticonvulsant medications:  gabapentin  Past anti-CGRP:  none Past vitamins/Herbal/Supplements:  none Past antihistamines/decongestants:  Flonase, Claritin Other past therapies:  none    History of head trauma/concussion:  Possibly while in the [employer] in 1996-1997.   Family history of headache:  not to her knowledge Family history of aneurysms:  father (AAA)  PAST MEDICAL HISTORY: Past Medical History:  Diagnosis Date   Abdominal pain, acute, right upper quadrant    Anxiety    Asthma    Back pain    Carcinoma in situ of right breast    Depression    Eczema    Fatty liver    GERD (gastroesophageal reflux disease)    Joint pain    Migraine    Obesity    Pain    Toes on bilateral feet   Personal history of radiation therapy    Polycystic ovarian disease    PTSD (post-traumatic stress disorder)    Sleep apnea     MEDICATIONS: Current Outpatient Medications on File Prior to Visit  Medication Sig Dispense Refill   Albuterol-Budesonide (AIRSUPRA ) 90-80 MCG/ACT AERO Inhale 2 puffs into the lungs every 6 (six) hours as needed. 10.7 g 5   chlorpheniramine (CHLOR-TRIMETON) 4 MG tablet Take 4 mg by mouth 2 (two) times daily as needed for allergies.     fexofenadine (ALLEGRA) 180 MG tablet Take 180 mg by mouth daily. (Patient taking differently: Take 180 mg by mouth as needed.)     fluticasone (FLONASE) 50 MCG/ACT nasal spray      metFORMIN (GLUCOPHAGE-XR) 500 MG 24 hr tablet Take  500 mg by mouth 2 (two) times daily with a meal.     oxybutynin (DITROPAN) 5 MG tablet Take by mouth.     rizatriptan  (MAXALT ) 10 MG tablet Take 10 mg by mouth as needed for migraine.     tamoxifen  (NOLVADEX ) 10 MG tablet TAKE 1 TABLET DAILY 90 tablet 3   topiramate  (TOPAMAX ) 50 MG tablet Take 1 tablet (50 mg total) by mouth at bedtime. 30 tablet 5   No current facility-administered medications on file prior to visit.    ALLERGIES: Allergies  Allergen Reactions   Cleocin  [Clindamycin  Hcl] Hives   Ivp Dye [Iodinated Contrast Media] Anaphylaxis   Latex Hives   Penicillins Hives   Shellfish Allergy Anaphylaxis   Adhesive [Tape]     Rash, itch     FAMILY HISTORY: Family History  Problem Relation Age of Onset   Allergies Mother    Hypertension Mother    Rheum arthritis Mother    Osteoporosis Mother    Skin cancer Mother    Emphysema Father  smoker   Allergies Father    Hyperlipidemia Father    Hypertension Father    Alcohol abuse Father    Stroke Maternal Uncle    Seizures Maternal Uncle    Parkinsonism Maternal Uncle    Dementia Maternal Uncle    Colon cancer Paternal Aunt        dx 50's/60's   Colon cancer Paternal Uncle 79   Lung cancer Paternal Uncle    Seizures Maternal Grandmother    Pancreatic cancer Maternal Grandmother 40   Dementia Paternal Grandmother    Skin cancer Paternal Grandmother    Heart attack Paternal Grandfather 50   Breast cancer Maternal Great-grandmother    Esophageal cancer Neg Hx    Stomach cancer Neg Hx    Ulcerative colitis Neg Hx       Objective:  Blood pressure 113/69, pulse 86, height 5' 3 (1.6 m), weight 267 lb (121.1 kg), SpO2 95%. General: No acute distress.  Patient appears well-groomed.   Head:  Normocephalic/atraumatic Eyes:  Fundi examined but not visualized Neck: supple, no paraspinal tenderness, full range of motion Heart:  Regular rate and rhythm Neurological Exam: alert and oriented.  Speech fluent and not  dysarthric, language intact.  Reduced right V2 sensation.  Otherwise, CN II-XII intact. Bulk and tone normal, muscle strength 5/5 throughout.  Sensation to light touch intact.  Deep tendon reflexes 2+ throughout, toes downgoing.  Finger to nose testing intact.  Gait normal, Romberg negative.   Juliene Dunnings, DO  CC: Mardy Kiss, PA-C

## 2024-05-28 ENCOUNTER — Ambulatory Visit: Admitting: Neurology

## 2024-05-28 ENCOUNTER — Other Ambulatory Visit (HOSPITAL_COMMUNITY): Payer: Self-pay

## 2024-05-28 ENCOUNTER — Encounter: Payer: Self-pay | Admitting: Neurology

## 2024-05-28 ENCOUNTER — Telehealth: Payer: Self-pay

## 2024-05-28 ENCOUNTER — Telehealth: Payer: Self-pay | Admitting: Pharmacy Technician

## 2024-05-28 VITALS — BP 113/69 | HR 86 | Ht 63.0 in | Wt 267.0 lb

## 2024-05-28 DIAGNOSIS — G43119 Migraine with aura, intractable, without status migrainosus: Secondary | ICD-10-CM

## 2024-05-28 MED ORDER — UBRELVY 100 MG PO TABS: 1.0000 | ORAL_TABLET | ORAL | 5 refills | Status: AC | PRN

## 2024-05-28 NOTE — Patient Instructions (Addendum)
 MRI brain without contrast Increase topiramate  to 50mg  at bedtime.  We can increase dose in 4 weeks if needed Ubrelvy  as needed. Limit use of pain relievers to no more than 9 days out of the month to prevent risk of rebound or medication-overuse headache. Diet Exercise Keep follow up in March

## 2024-05-28 NOTE — Telephone Encounter (Signed)
 PA needed for [employer]

## 2024-05-28 NOTE — Telephone Encounter (Signed)
 Pharmacy Patient Advocate Encounter   Received notification from Pt Calls Messages that prior authorization for UBRELVY  100MG  is required/requested.   Insurance verification completed.   The patient is insured through [employer] .   Per test claim: The current 30 day co-pay is, $43.  No PA needed at this time. This test claim was processed through [facility]- copay amounts may vary at other pharmacies due to pharmacy/plan contracts, or as the patient moves through the different stages of their insurance plan.

## 2024-05-28 NOTE — Telephone Encounter (Signed)
 PA has been submitted, and telephone encounter has been created. Please see telephone encounter dated 9.25.25.  NO PA IS NEEDED

## 2024-06-07 ENCOUNTER — Ambulatory Visit
Admission: RE | Admit: 2024-06-07 | Discharge: 2024-06-07 | Disposition: A | Source: Ambulatory Visit | Attending: Neurology

## 2024-06-07 DIAGNOSIS — G43119 Migraine with aura, intractable, without status migrainosus: Secondary | ICD-10-CM

## 2024-06-08 ENCOUNTER — Ambulatory Visit: Payer: Self-pay | Admitting: Neurology

## 2024-06-08 ENCOUNTER — Other Ambulatory Visit: Payer: Self-pay | Admitting: Hematology and Oncology

## 2024-06-08 ENCOUNTER — Telehealth: Payer: Self-pay | Admitting: Neurology

## 2024-06-08 DIAGNOSIS — Z170 Estrogen receptor positive status [ER+]: Secondary | ICD-10-CM

## 2024-06-08 DIAGNOSIS — R9089 Other abnormal findings on diagnostic imaging of central nervous system: Secondary | ICD-10-CM

## 2024-06-08 NOTE — Telephone Encounter (Signed)
 Critical MRI results received.

## 2024-06-08 NOTE — Progress Notes (Signed)
 I was informed by Dr. Skeet that the patient was found to have skeletal lesion suspicious for metastasis.  I would like to obtain CT chest abdomen pelvis without contrast and a bone scan.

## 2024-06-08 NOTE — Telephone Encounter (Signed)
 [facility] at [phone], called today and lefted a message. They stated that pt receive a Brain MRI yesterday and they have the  results. The Radiology stated that you can call their office to discuss the results. Thanks

## 2024-06-09 ENCOUNTER — Telehealth: Payer: Self-pay | Admitting: *Deleted

## 2024-06-09 ENCOUNTER — Ambulatory Visit
Admission: RE | Admit: 2024-06-09 | Discharge: 2024-06-09 | Disposition: A | Discharge status: Home / Self Care | Source: Ambulatory Visit | Attending: Neurology | Admitting: Neurology

## 2024-06-09 DIAGNOSIS — R9089 Other abnormal findings on diagnostic imaging of central nervous system: Secondary | ICD-10-CM

## 2024-06-09 NOTE — Telephone Encounter (Signed)
 Per Dr.Jaffe, I called and spoke with patient regarding MRI results. Brain looks okay. There is an incidental osseous lesion within the C2 vertebral body. I would like to order MRI of the cervical spine without contrast (she cannot have contrast due to allergy presenting as anaphylaxis even with Benadryl  and steroids). I have also reached out to her oncologist, Dr. Gudena, who plans to order bone scan and CT CAP.

## 2024-06-09 NOTE — Telephone Encounter (Signed)
 Scheduled MD f/u visit after scans.  Pt notified and verbalized understanding.

## 2024-06-11 ENCOUNTER — Encounter: Payer: Self-pay | Admitting: Neurology

## 2024-06-12 ENCOUNTER — Ambulatory Visit (HOSPITAL_COMMUNITY)
Admission: RE | Admit: 2024-06-12 | Discharge: 2024-06-12 | Disposition: A | Discharge status: Home / Self Care | Source: Ambulatory Visit | Attending: Hematology and Oncology | Admitting: Hematology and Oncology

## 2024-06-12 ENCOUNTER — Encounter (HOSPITAL_COMMUNITY)
Admission: RE | Admit: 2024-06-12 | Discharge: 2024-06-12 | Disposition: A | Discharge status: Home / Self Care | Source: Ambulatory Visit | Attending: Hematology and Oncology | Admitting: Hematology and Oncology

## 2024-06-12 DIAGNOSIS — C50511 Malignant neoplasm of lower-outer quadrant of right female breast: Secondary | ICD-10-CM

## 2024-06-12 DIAGNOSIS — Z170 Estrogen receptor positive status [ER+]: Secondary | ICD-10-CM | POA: Diagnosis present

## 2024-06-12 MED ORDER — TECHNETIUM TC 99M MEDRONATE IV KIT
20.0000 | PACK | Freq: Once | INTRAVENOUS | Status: AC | PRN
  Administered 2024-06-12: 21.4 via INTRAVENOUS

## 2024-06-12 NOTE — Telephone Encounter (Signed)
 Ms. Loeffler is referring to the MRI of the cervical spine which has not yet been read.

## 2024-06-16 ENCOUNTER — Inpatient Hospital Stay

## 2024-06-16 ENCOUNTER — Other Ambulatory Visit: Payer: Self-pay | Admitting: *Deleted

## 2024-06-16 ENCOUNTER — Inpatient Hospital Stay: Attending: Hematology and Oncology | Admitting: Hematology and Oncology

## 2024-06-16 ENCOUNTER — Ambulatory Visit: Payer: Self-pay | Admitting: Neurology

## 2024-06-16 ENCOUNTER — Encounter: Payer: Self-pay | Admitting: Hematology and Oncology

## 2024-06-16 VITALS — BP 132/50 | HR 69 | Temp 97.7°F | Resp 17 | Wt 263.2 lb

## 2024-06-16 DIAGNOSIS — R911 Solitary pulmonary nodule: Secondary | ICD-10-CM | POA: Diagnosis not present

## 2024-06-16 DIAGNOSIS — C7951 Secondary malignant neoplasm of bone: Secondary | ICD-10-CM | POA: Diagnosis not present

## 2024-06-16 DIAGNOSIS — N951 Menopausal and female climacteric states: Secondary | ICD-10-CM | POA: Insufficient documentation

## 2024-06-16 DIAGNOSIS — C50511 Malignant neoplasm of lower-outer quadrant of right female breast: Secondary | ICD-10-CM | POA: Diagnosis not present

## 2024-06-16 DIAGNOSIS — M2550 Pain in unspecified joint: Secondary | ICD-10-CM | POA: Insufficient documentation

## 2024-06-16 DIAGNOSIS — Z170 Estrogen receptor positive status [ER+]: Secondary | ICD-10-CM | POA: Insufficient documentation

## 2024-06-16 DIAGNOSIS — R7303 Prediabetes: Secondary | ICD-10-CM | POA: Insufficient documentation

## 2024-06-16 DIAGNOSIS — Z79810 Long term (current) use of selective estrogen receptor modulators (SERMs): Secondary | ICD-10-CM | POA: Insufficient documentation

## 2024-06-16 DIAGNOSIS — R200 Anesthesia of skin: Secondary | ICD-10-CM | POA: Diagnosis not present

## 2024-06-16 NOTE — Assessment & Plan Note (Signed)
 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  Joint pains and stiffness: Much better on 10 mg dose 2. 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  Breast exam 11/18/2023: Benign 2.  Mammogram left breast 12/04/2021: Benign breast density category C 1 patient needs a new mammogram CT Chest 01/27/2023: Bilateral pulmonary nodules 4 mm, felt to be low risk 3.  CT CAP 06/12/2024: Stable 4 mm nodule left lung apex and 3 mm nodule superior segment left lower lobe 4.  Bone scan 06/12/2024: Pending 5.  MRI cervical spine: 06/09/2024: Solitary suspicious C2 vertebral body lesion  Recommendation: Interventional radiology for C2 vertebral body lesion biopsy Follow-up after the biopsy to discuss pathology report

## 2024-06-16 NOTE — Progress Notes (Signed)
 Patient advised.

## 2024-06-16 NOTE — Progress Notes (Signed)
 Per MD request, RN faxed referral to [facility] [phone]) for evaluation and treatment of new C2 mets.

## 2024-06-16 NOTE — Progress Notes (Signed)
 Patient Care Team: Vida Mardy VEAR DEVONNA as PCP - General (Physician Assistant) Merlynn Ozell SAUNDERS, DPM as Attending Physician (Podiatry) Odean Potts, MD as Consulting Physician (Hematology and Oncology) Vanderbilt Ned, MD as Consulting Physician (General Surgery) Vallerie Verneita RAMAN, MD as Referring Physician (Radiation Oncology) Crawford Morna Pickle, NP as Nurse Practitioner (Hematology and Oncology) Maree Isles, MD as Referring Physician (Internal Medicine) Skeet Juliene SAUNDERS, DO as Consulting Physician (Neurology)  DIAGNOSIS:  Encounter Diagnosis  Name Primary?   Malignant neoplasm of lower-outer quadrant of right breast of female, estrogen receptor positive (HCC) Yes    SUMMARY OF ONCOLOGIC HISTORY: Oncology History  Malignant neoplasm of lower-outer quadrant of right breast of female, estrogen receptor positive (HCC)  11/06/2017 Initial Diagnosis   Palpable right breast mass which on radiologic evaluation was epidermoid cyst; incidentally found additional right breast mass at 8 o'clock position: 1.6 cm: IDC grade 2, ER 90%, PR 100%, Ki-67 2%, HER-2 negative, T1CN0 stage I a AJCC 8   11/25/2017 Genetic Testing   The Common Hereditary Cancer Panel offered by [facility] includes sequencing and/or deletion duplication testing of the following 47 genes: APC, ATM, AXIN2, BARD1, BMPR1A, BRCA1, BRCA2, BRIP1, CDH1, CDKN2A (p14ARF), CDKN2A (p16INK4a), CKD4, CHEK2, CTNNA1, DICER1, EPCAM (Deletion/duplication testing only), GREM1 (promoter region deletion/duplication testing only), KIT, MEN1, MLH1, MSH2, MSH3, MSH6, MUTYH, NBN, NF1, NHTL1, PALB2, PDGFRA, PMS2, POLD1, POLE, PTEN, RAD50, RAD51C, RAD51D, SDHB, SDHC, SDHD, SMAD4, SMARCA4. STK11, TP53, TSC1, TSC2, and VHL.  The following genes were evaluated for sequence changes only: SDHA and HOXB13 c.251G>A variant only.  Results: Negative, no pathogenic variants identified.  The date of this test report is 11/25/2017.    12/05/2017 Surgery   Right  mastectomy: Invasive ductal carcinoma 1.1 cm with lymphovascular invasion, 1/5 lymph nodes positive, grade 2, ER 90%, PR 100%, Ki-67 2%, HER-2 negative T1bN1 Stage 1B.  Mammaprint low risk   01/31/2018 - 03/14/2018 Radiation Therapy   Adjuvant XRT at [facility]   04/2018 -  Anti-estrogen oral therapy   Tamoxifen  daily   06/25/2018 Cancer Staging   Staging form: Breast, AJCC 8th Edition - Pathologic: Stage IA (pT1b, pN1, cM0, G2, ER+, PR+, HER2-) - Signed by Crawford Morna Pickle, NP on 06/25/2018     CHIEF COMPLIANT: Follow-up to discuss results of spine MRI and bone scan  HISTORY OF PRESENT ILLNESS: History of Present Illness Erica Higgins is a 53 year old female with breast cancer who presents for evaluation of a spot on C2 seen on MRI and bone scan.  She is on tamoxifen  10 mg daily for breast cancer. Recent MRI and bone scan show a spot on the C2 vertebra. She has no history of neck trauma. She experiences cracking and popping sounds in her neck with occasional aching. Neck pain is not unusual for her and may be related to posture or activities.     ALLERGIES:  is allergic to cleocin  [clindamycin  hcl], ivp dye [iodinated contrast media], latex, penicillins, shellfish allergy, and adhesive [tape].  MEDICATIONS:  Current Outpatient Medications  Medication Sig Dispense Refill   Albuterol-Budesonide (AIRSUPRA ) 90-80 MCG/ACT AERO Inhale 2 puffs into the lungs every 6 (six) hours as needed. 10.7 g 5   chlorpheniramine (CHLOR-TRIMETON) 4 MG tablet Take 4 mg by mouth 2 (two) times daily as needed for allergies.     fexofenadine (ALLEGRA) 180 MG tablet Take 180 mg by mouth daily. (Patient taking differently: Take 180 mg by mouth as needed.)     fluticasone (FLONASE) 50 MCG/ACT nasal  spray      metFORMIN (GLUCOPHAGE-XR) 500 MG 24 hr tablet Take 500 mg by mouth 2 (two) times daily with a meal.     oxybutynin (DITROPAN) 5 MG tablet Take by mouth.     tamoxifen  (NOLVADEX ) 10 MG tablet TAKE 1 TABLET  DAILY 90 tablet 3   topiramate  (TOPAMAX ) 50 MG tablet Take 1 tablet (50 mg total) by mouth at bedtime. 30 tablet 5   Ubrogepant  (UBRELVY ) 100 MG TABS Take 1 tablet (100 mg total) by mouth as needed. May repeat after 2 hours.  Maximum 2 tablets in 24 hours. 16 tablet 5   No current facility-administered medications for this visit.    PHYSICAL EXAMINATION: ECOG PERFORMANCE STATUS: 1 - Symptomatic but completely ambulatory  Vitals:   06/16/24 1333  BP: (!) 132/50  Pulse: 69  Resp: 17  Temp: 97.7 F (36.5 C)  SpO2: 100%   Filed Weights   06/16/24 1333  Weight: 263 lb 3.2 oz (119.4 kg)    Physical Exam   (exam performed in the presence of a chaperone)  LABORATORY DATA:  I have reviewed the data as listed    Latest Ref Rng & Units 05/20/2024    7:05 PM 10/15/2019   12:02 PM 09/11/2018    9:01 AM  CMP  Glucose 70 - 99 mg/dL 882  73  862   BUN 6 - 20 mg/dL 11  10  9    Creatinine 0.44 - 1.00 mg/dL 9.19  9.25  9.17   Sodium 135 - 145 mmol/L 142  143  140   Potassium 3.5 - 5.1 mmol/L 3.7  4.5  3.8   Chloride 98 - 111 mmol/L 104  105  106   CO2 22 - 32 mmol/L 26  26  27    Calcium 8.9 - 10.3 mg/dL 9.2  9.0  8.9   Total Protein 6.5 - 8.1 g/dL 7.6  6.3  6.9   Total Bilirubin 0.0 - 1.2 mg/dL 0.6  <9.7  0.4   Alkaline Phos 38 - 126 U/L 74  88  69   AST 15 - 41 U/L 26  25  22    ALT 0 - 44 U/L 29  23  27      Lab Results  Component Value Date   WBC 10.3 05/20/2024   HGB 13.9 05/20/2024   HCT 42.0 05/20/2024   MCV 90.5 05/20/2024   PLT 288 05/20/2024   NEUTROABS 6.0 05/20/2024    ASSESSMENT & PLAN:  Malignant neoplasm of lower-outer quadrant of right breast of female, estrogen receptor positive (HCC) 12/05/17: Right mastectomy: Invasive ductal carcinoma 1.1 cm with lymphovascular invasion, 1/5 lymph nodes positive, grade 2, ER 90%, PR 100%, Ki-67 2%, HER-2 negative T1bN1 Stage 1B Mammaprint 12/21/2017: Luminal type A low risk Adjuvant radiation therapy completed   Plan:  Tamoxifen  20 mg daily August 2019 dose reduced to 10 mg on June 19, 2019 because of severe joint pains and stiffness   Tamoxifen  toxicities:  1.  Joint pains and stiffness: Much better on 10 mg dose 2. 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  Breast exam 11/18/2023: Benign 2.  Mammogram left breast 12/04/2021: Benign breast density category C 1 patient needs a new mammogram CT Chest 01/27/2023: Bilateral pulmonary nodules 4 mm, felt to be low risk 3.  CT CAP 06/12/2024: Stable 4 mm nodule left lung apex and 3 mm nodule superior segment left lower  lobe 4.  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  MRI cervical spine: 06/09/2024: Solitary suspicious C2 vertebral body lesion  Recommendation:  I discussed with interventional radiology who did not think that she could be biopsied by IR Recommend PET CT scan to see if it is hypermetabolic. If any additional areas of hypermetabolic activity are noted we will try to biopsy them. Send a referral to radiation oncology in [place]. Perform blood work today for [facility] and estradiol .  If she is menopausal then we can consider switching her to aromatase inhibitor therapy. Erman will be obtained.  Follow-up in 2 weeks to review the results of the PET scan.   No orders of the defined types were placed in this encounter.  The patient has a good understanding of the overall plan. she agrees with it. she will call with any problems that may develop before the next visit here.  I personally spent a total of 30 minutes in the care of the patient today including preparing to see the patient, getting/reviewing separately obtained history, performing a medically appropriate exam/evaluation, counseling and educating, placing orders, referring and communicating with other health care professionals, documenting clinical information in the EHR, independently interpreting results,  communicating results, and coordinating care.   Viinay K Lacoya Wilbanks, MD 06/16/24

## 2024-06-17 LAB — FOLLICLE STIMULATING HORMONE: FSH: 38.9 m[IU]/mL

## 2024-06-17 LAB — CANCER ANTIGEN 15-3: CA 15-3: 40.1 U/mL — ABNORMAL HIGH (ref 0.0–25.0)

## 2024-06-17 LAB — CANCER ANTIGEN 27.29: CA 27.29: 59.4 U/mL — ABNORMAL HIGH (ref 0.0–38.6)

## 2024-06-22 ENCOUNTER — Encounter (HOSPITAL_COMMUNITY)
Admission: RE | Admit: 2024-06-22 | Discharge: 2024-06-22 | Disposition: A | Discharge status: Home / Self Care | Source: Ambulatory Visit | Attending: Hematology and Oncology | Admitting: Hematology and Oncology

## 2024-06-22 DIAGNOSIS — C50511 Malignant neoplasm of lower-outer quadrant of right female breast: Secondary | ICD-10-CM | POA: Diagnosis present

## 2024-06-22 DIAGNOSIS — Z170 Estrogen receptor positive status [ER+]: Secondary | ICD-10-CM | POA: Diagnosis present

## 2024-06-22 LAB — GLUCOSE, CAPILLARY: Glucose-Capillary: 116 mg/dL — ABNORMAL HIGH (ref 70–99)

## 2024-06-22 MED ORDER — FLUDEOXYGLUCOSE F - 18 (FDG) INJECTION
13.2000 | Freq: Once | INTRAVENOUS | Status: AC
  Administered 2024-06-22: 13.06 via INTRAVENOUS

## 2024-06-23 LAB — ESTRADIOL, ULTRA SENS: Estradiol, Sensitive: 19.8 pg/mL

## 2024-06-25 ENCOUNTER — Inpatient Hospital Stay: Admitting: Hematology and Oncology

## 2024-06-25 ENCOUNTER — Telehealth: Payer: Self-pay | Admitting: Pharmacist

## 2024-06-25 ENCOUNTER — Other Ambulatory Visit (HOSPITAL_COMMUNITY): Payer: Self-pay

## 2024-06-25 ENCOUNTER — Telehealth: Payer: Self-pay

## 2024-06-25 VITALS — BP 131/75 | HR 81 | Temp 98.3°F | Resp 18 | Ht 63.0 in | Wt 264.3 lb

## 2024-06-25 DIAGNOSIS — C50511 Malignant neoplasm of lower-outer quadrant of right female breast: Secondary | ICD-10-CM

## 2024-06-25 DIAGNOSIS — Z170 Estrogen receptor positive status [ER+]: Secondary | ICD-10-CM

## 2024-06-25 MED ORDER — TAMOXIFEN CITRATE 20 MG PO TABS: 20.0000 mg | ORAL_TABLET | Freq: Every day | ORAL | 3 refills | Status: DC

## 2024-06-25 MED ORDER — ABEMACICLIB 100 MG PO TABS: 100.0000 mg | ORAL_TABLET | Freq: Two times a day (BID) | ORAL | 3 refills | Status: DC

## 2024-06-25 NOTE — Progress Notes (Signed)
 Patient Care Team: Vida Mardy VEAR DEVONNA as PCP - General (Physician Assistant) Merlynn Ozell SAUNDERS, DPM as Attending Physician (Podiatry) Odean Potts, MD as Consulting Physician (Hematology and Oncology) Vanderbilt Ned, MD as Consulting Physician (General Surgery) Vallerie Verneita RAMAN, MD as Referring Physician (Radiation Oncology) Crawford Morna Pickle, NP as Nurse Practitioner (Hematology and Oncology) Maree Isles, MD as Referring Physician (Internal Medicine) Skeet Juliene SAUNDERS, DO as Consulting Physician (Neurology)  DIAGNOSIS:  Encounter Diagnosis  Name Primary?   Malignant neoplasm of lower-outer quadrant of right breast of female, estrogen receptor positive (HCC) Yes    SUMMARY OF ONCOLOGIC HISTORY: Oncology History  Malignant neoplasm of lower-outer quadrant of right breast of female, estrogen receptor positive (HCC)  11/06/2017 Initial Diagnosis   Palpable right breast mass which on radiologic evaluation was epidermoid cyst; incidentally found additional right breast mass at 8 o'clock position: 1.6 cm: IDC grade 2, ER 90%, PR 100%, Ki-67 2%, HER-2 negative, T1CN0 stage I a AJCC 8   11/25/2017 Genetic Testing   The Common Hereditary Cancer Panel offered by [facility] includes sequencing and/or deletion duplication testing of the following 47 genes: APC, ATM, AXIN2, BARD1, BMPR1A, BRCA1, BRCA2, BRIP1, CDH1, CDKN2A (p14ARF), CDKN2A (p16INK4a), CKD4, CHEK2, CTNNA1, DICER1, EPCAM (Deletion/duplication testing only), GREM1 (promoter region deletion/duplication testing only), KIT, MEN1, MLH1, MSH2, MSH3, MSH6, MUTYH, NBN, NF1, NHTL1, PALB2, PDGFRA, PMS2, POLD1, POLE, PTEN, RAD50, RAD51C, RAD51D, SDHB, SDHC, SDHD, SMAD4, SMARCA4. STK11, TP53, TSC1, TSC2, and VHL.  The following genes were evaluated for sequence changes only: SDHA and HOXB13 c.251G>A variant only.  Results: Negative, no pathogenic variants identified.  The date of this test report is 11/25/2017.    12/05/2017 Surgery   Right  mastectomy: Invasive ductal carcinoma 1.1 cm with lymphovascular invasion, 1/5 lymph nodes positive, grade 2, ER 90%, PR 100%, Ki-67 2%, HER-2 negative T1bN1 Stage 1B.  Mammaprint low risk   01/31/2018 - 03/14/2018 Radiation Therapy   Adjuvant XRT at [facility]   04/2018 -  Anti-estrogen oral therapy   Tamoxifen  daily   06/25/2018 Cancer Staging   Staging form: Breast, AJCC 8th Edition - Pathologic: Stage IA (pT1b, pN1, cM0, G2, ER+, PR+, HER2-) - Signed by Crawford Morna Pickle, NP on 06/25/2018     CHIEF COMPLIANT: Follow-up to discuss result of the PET scan and lab work and to discuss her treatment plan  HISTORY OF PRESENT ILLNESS:  History of Present Illness Erica Higgins is a 53 year old female with estrogen receptor positive breast cancer who presents for follow-up regarding her treatment plan.  She initially presented with a small tumor and one positive lymph node. A recent PET scan showed an area of concern at the C2 vertebrae, which is lighting up but not affecting the spinal cord or brain stem. She experiences numbness in her cheek, though the cause is unclear.  She is on tamoxifen  10 mg daily. Her estradiol  levels are nearing postmenopausal range, with menopause expected in three to six months. A lung nodule was identified but not highlighted on the PET scan due to its small size. Blood work showed CA27-29 levels at 59 and CA15-3 levels at 25, which are mildly elevated.  Guardant360 tests did not detect significant abnormalities because of the amount of detected circulating tumor DNA is very minimal  She has a history of migraines, which have shifted sides, and continues to experience cheek numbness. There are no symptoms affecting her spinal cord or brain stem. She is concerned about potential cancer spread.  ALLERGIES:  is allergic to cleocin  [clindamycin  hcl], ivp dye [iodinated contrast media], latex, penicillins, shellfish allergy, and adhesive [tape].  MEDICATIONS:   Current Outpatient Medications  Medication Sig Dispense Refill   Albuterol-Budesonide (AIRSUPRA ) 90-80 MCG/ACT AERO Inhale 2 puffs into the lungs every 6 (six) hours as needed. 10.7 g 5   chlorpheniramine (CHLOR-TRIMETON) 4 MG tablet Take 4 mg by mouth 2 (two) times daily as needed for allergies.     fexofenadine (ALLEGRA) 180 MG tablet Take 180 mg by mouth daily.     fluticasone (FLONASE) 50 MCG/ACT nasal spray      metFORMIN (GLUCOPHAGE-XR) 500 MG 24 hr tablet Take 500 mg by mouth 2 (two) times daily with a meal.     oxybutynin (DITROPAN) 5 MG tablet Take by mouth.     tamoxifen  (NOLVADEX ) 10 MG tablet TAKE 1 TABLET DAILY 90 tablet 3   topiramate  (TOPAMAX ) 50 MG tablet Take 1 tablet (50 mg total) by mouth at bedtime. 30 tablet 5   Ubrogepant  (UBRELVY ) 100 MG TABS Take 1 tablet (100 mg total) by mouth as needed. May repeat after 2 hours.  Maximum 2 tablets in 24 hours. 16 tablet 5   No current facility-administered medications for this visit.    PHYSICAL EXAMINATION: ECOG PERFORMANCE STATUS: 1 - Symptomatic but completely ambulatory  Vitals:   06/25/24 0800  BP: 131/75  Pulse: 81  Resp: 18  Temp: 98.3 F (36.8 C)  SpO2: 99%   Filed Weights   06/25/24 0800  Weight: 264 lb 4.8 oz (119.9 kg)    Physical Exam   (exam performed in the presence of a chaperone)  LABORATORY DATA:  I have reviewed the data as listed    Latest Ref Rng & Units 05/20/2024    7:05 PM 10/15/2019   12:02 PM 09/11/2018    9:01 AM  CMP  Glucose 70 - 99 mg/dL 882  73  862   BUN 6 - 20 mg/dL 11  10  9    Creatinine 0.44 - 1.00 mg/dL 9.19  9.25  9.17   Sodium 135 - 145 mmol/L 142  143  140   Potassium 3.5 - 5.1 mmol/L 3.7  4.5  3.8   Chloride 98 - 111 mmol/L 104  105  106   CO2 22 - 32 mmol/L 26  26  27    Calcium 8.9 - 10.3 mg/dL 9.2  9.0  8.9   Total Protein 6.5 - 8.1 g/dL 7.6  6.3  6.9   Total Bilirubin 0.0 - 1.2 mg/dL 0.6  <9.7  0.4   Alkaline Phos 38 - 126 U/L 74  88  69   AST 15 - 41 U/L 26  25   22    ALT 0 - 44 U/L 29  23  27      Lab Results  Component Value Date   WBC 10.3 05/20/2024   HGB 13.9 05/20/2024   HCT 42.0 05/20/2024   MCV 90.5 05/20/2024   PLT 288 05/20/2024   NEUTROABS 6.0 05/20/2024    ASSESSMENT & PLAN:  Malignant neoplasm of lower-outer quadrant of right breast of female, estrogen receptor positive (HCC) 12/05/17: Right mastectomy: Invasive ductal carcinoma 1.1 cm with lymphovascular invasion, 1/5 lymph nodes positive, grade 2, ER 90%, PR 100%, Ki-67 2%, HER-2 negative T1bN1 Stage 1B Mammaprint 12/21/2017: Luminal type A low risk Adjuvant radiation therapy completed   Plan: Tamoxifen  20 mg daily August 2019 dose reduced to 10 mg on June 19, 2019 because of severe joint  pains and stiffness   Tamoxifen  toxicities:  1.  Joint pains and stiffness: Much better on 10 mg dose 2. 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  Breast exam 11/18/2023: Benign 2.  Mammogram left breast 12/04/2021: Benign breast density category C 1 patient needs a new mammogram CT Chest 01/27/2023: Bilateral pulmonary nodules 4 mm, felt to be low risk 3.  CT CAP 06/12/2024: Stable 4 mm nodule left lung apex and 3 mm nodule superior segment left lower lobe 4.  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  MRI cervical spine: 06/09/2024: Solitary suspicious C2 vertebral body lesion 6. PET-CT 06/22/24: Hypermetabolic C2 vertebral body corresponding to a suspicious lesion.  No additional skeletal activity or metastases.  Treatment plan: Discussed with interventional radiology: C2 lesion cannot be biopsied. Radiation oncology and [facility] to evaluate for palliative radiation Menopause labs: Estradiol  19.8, FSH 38.9 (patient is perimenopausal) therefore we do not think she would be ideal candidate to switch to aromatase inhibitors yet. Guardant360 pending Recommendation: Increase dosage of tamoxifen  to 20 mg a day and recheck  of menopause labs again in 3 months.  Recommend addition of Verzinio to tamoxifen .  Abemaciclib counseling: I discussed at length the risks and benefits of Abemaciclib in combination with letrozole. Adverse effects of Abemaciclib include decreasing neutrophil count, pneumonia, blood clots in lungs as well as nausea and GI symptoms. Side effects of letrozole include hot flashes, muscle aches and pains, uterine bleeding/spotting/cancer, osteoporosis, risk of blood clots.  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 Estrogen receptor positive, HER2 negative, with one positive lymph node. Biopsy not feasible due to lesion location. - Increase tamoxifen  to 20 mg daily. - Initiate Verzenio (abemaciclib) at a middle dose twice daily. - Discuss Verzenio side effects: diarrhea, fatigue; monitor liver function. - Consult radiation oncologist for potential radiation therapy. - Follow-up: every two weeks for first month, then every four weeks for two months, then every three months. - Provide Imodium for diarrhea, max 8 tablets/day. - Ensure Verzenio provided at no cost. - Schedule pharmacist appointment for medication education.  Pulmonary nodule under surveillance Small pulmonary nodule not PET avid, likely due to size. - CT scan of chest in three months.  Prediabetes on metformin Managed with metformin 500 mg daily; experiences gastrointestinal discomfort. - Continue metformin 500 mg daily. - Encourage dietary modifications to manage prediabetes and potentially discontinue metformin.      No orders of the defined types were placed in this encounter.  The patient has a good understanding of the overall plan. she agrees with it. she will call with any problems that may develop before the  next visit here.  I personally spent a total of 45  minutes in the care of the patient today including preparing to see the patient, getting/reviewing separately obtained history, performing a medically appropriate exam/evaluation, counseling and educating, placing orders, referring and communicating with other health care professionals, documenting clinical information in the EHR, independently interpreting results, communicating results, and coordinating care.   Viinay K Deo Mehringer, MD 06/25/24

## 2024-06-25 NOTE — Telephone Encounter (Signed)
 Oral Oncology Patient Advocate Encounter   Received notification that prior authorization for VERZENIO is required.   PA submitted on 06/25/24 Key [id number] Status is pending      Charlott Hamilton,  CPhT-Adv  she/her/hers [facility]  [facility] Pharmacy Technician Patient Advocate Specialist III [facility] Phone: [phone]  Fax: [phone] [email or web address]

## 2024-06-25 NOTE — Assessment & Plan Note (Signed)
 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  Joint pains and stiffness: Much better on 10 mg dose 2. 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  Breast exam 11/18/2023: Benign 2.  Mammogram left breast 12/04/2021: Benign breast density category C 1 patient needs a new mammogram CT Chest 01/27/2023: Bilateral pulmonary nodules 4 mm, felt to be low risk 3.  CT CAP 06/12/2024: Stable 4 mm nodule left lung apex and 3 mm nodule superior segment left lower lobe 4.  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  MRI cervical spine: 06/09/2024: Solitary suspicious C2 vertebral body lesion 6. PET-CT 06/22/24: Hypermetabolic C2 vertebral body corresponding to a suspicious lesion.  No additional skeletal activity or metastases.  Treatment plan: Discussed with interventional radiology: C2 lesion cannot be biopsied. Radiation oncology and [facility] to evaluate for palliative radiation Menopause labs: Estradiol  19.8, FSH 38.9 (patient is perimenopausal) therefore we do not think she would be ideal candidate to switch to aromatase inhibitors yet. [email or web address] pending Recommendation: Increase dosage of tamoxifen  to 20 mg a day and recheck of menopause labs again in 3 months.  Recommend addition of Verzinio to tamoxifen .  Abemaciclib counseling: I discussed at length the risks and benefits of Abemaciclib in combination with letrozole. Adverse effects of Abemaciclib include decreasing neutrophil count, pneumonia, blood clots in lungs as well as nausea and GI symptoms. Side  effects of letrozole include hot flashes, muscle aches and pains, uterine bleeding/spotting/cancer, osteoporosis, risk of blood clots.  Return to clinic in 1 week for pharmacy education

## 2024-06-25 NOTE — Telephone Encounter (Signed)
 [facility]    Oncology Clinical Pharmacist Practitioner Initial Assessment  Received new prescription for abemaciclib for the treatment of metastatic breast cancer. This is being given in combination with tamoxifen . It is planned to continue until disease progression or unacceptable toxicity.  Labs will be done prior to starting. Prescription dose and frequency assessed.   Current medication list in Epic reviewed. Significant DDIs with abemaciclib identified:No.  Evaluated chart, patient barriers to medication adherence identified: Yes.  Patient agreement for treatment documented in MD note on 06/25/24.  Prescription has been e-scribed to the [facility]) for benefits analysis and approval.  Oral Oncology Clinic will continue to follow for insurance authorization, copayment issues, initial counseling and start date.  Erica Higgins, PharmD, BCOP, CPP Hematology-Oncology Clinical Pharmacist Practitioner  06/25/2024 1:30 PM  **Disclaimer: This note was dictated with voice recognition software. Similar sounding words can inadvertently be transcribed and this note may contain transcription errors which may not have been corrected upon publication of note.**

## 2024-06-26 ENCOUNTER — Encounter: Payer: Self-pay | Admitting: Hematology and Oncology

## 2024-06-26 NOTE — Telephone Encounter (Signed)
 Oral Oncology Patient Advocate Encounter  Received notification that the request for prior authorization for Verzenio has been denied due to .      Charlott Hamilton,  CPhT-Adv  she/her/hers [facility]  [facility] Pharm[facility] Services Pharmacy Technician Patient Advocate Specialist III [facility] Phone: [phone]  Fax: [phone] [email or web address]

## 2024-07-01 ENCOUNTER — Other Ambulatory Visit (HOSPITAL_COMMUNITY): Payer: Self-pay

## 2024-07-01 NOTE — Telephone Encounter (Signed)
 Oral Oncology Patient Advocate Encounter   Started application for assistance for Verzenio to the [employer].  [employer] phone number [phone] I have LVM to inform the patient of the process  and emailed the application to patient for signatures.   Will continue to follow until final determination.    Charlott Hamilton,  CPhT-Adv  she/her/hers [facility]  [facility] Pharm[facility] Services Pharmacy Technician Patient Advocate Specialist III [facility] Phone: [phone]  Fax: [phone] [email or web address]

## 2024-07-02 ENCOUNTER — Inpatient Hospital Stay

## 2024-07-02 ENCOUNTER — Telehealth: Payer: Self-pay

## 2024-07-02 ENCOUNTER — Inpatient Hospital Stay: Admitting: Pharmacist

## 2024-07-02 DIAGNOSIS — Z170 Estrogen receptor positive status [ER+]: Secondary | ICD-10-CM

## 2024-07-02 DIAGNOSIS — C50511 Malignant neoplasm of lower-outer quadrant of right female breast: Secondary | ICD-10-CM | POA: Diagnosis not present

## 2024-07-02 LAB — CBC WITH DIFFERENTIAL (CANCER CENTER ONLY)
Abs Immature Granulocytes: 0.02 K/uL (ref 0.00–0.07)
Basophils Absolute: 0 K/uL (ref 0.0–0.1)
Basophils Relative: 0 %
Eosinophils Absolute: 0.1 K/uL (ref 0.0–0.5)
Eosinophils Relative: 1 %
HCT: 39.6 % (ref 36.0–46.0)
Hemoglobin: 13.6 g/dL (ref 12.0–15.0)
Immature Granulocytes: 0 %
Lymphocytes Relative: 32 %
Lymphs Abs: 2.5 K/uL (ref 0.7–4.0)
MCH: 30 pg (ref 26.0–34.0)
MCHC: 34.3 g/dL (ref 30.0–36.0)
MCV: 87.4 fL (ref 80.0–100.0)
Monocytes Absolute: 0.6 K/uL (ref 0.1–1.0)
Monocytes Relative: 7 %
Neutro Abs: 4.6 K/uL (ref 1.7–7.7)
Neutrophils Relative %: 60 %
Platelet Count: 260 K/uL (ref 150–400)
RBC: 4.53 MIL/uL (ref 3.87–5.11)
RDW: 12.6 % (ref 11.5–15.5)
WBC Count: 7.9 K/uL (ref 4.0–10.5)
nRBC: 0 % (ref 0.0–0.2)

## 2024-07-02 LAB — CMP (CANCER CENTER ONLY)
ALT: 35 U/L (ref 0–44)
AST: 33 U/L (ref 15–41)
Albumin: 4 g/dL (ref 3.5–5.0)
Alkaline Phosphatase: 77 U/L (ref 38–126)
Anion gap: 6 (ref 5–15)
BUN: 14 mg/dL (ref 6–20)
CO2: 27 mmol/L (ref 22–32)
Calcium: 9 mg/dL (ref 8.9–10.3)
Chloride: 107 mmol/L (ref 98–111)
Creatinine: 1.02 mg/dL — ABNORMAL HIGH (ref 0.44–1.00)
GFR, Estimated: >60 mL/min (ref >60)
Glucose, Bld: 139 mg/dL — ABNORMAL HIGH (ref 70–99)
Potassium: 4.2 mmol/L (ref 3.5–5.1)
Sodium: 140 mmol/L (ref 135–145)
Total Bilirubin: 0.3 mg/dL (ref 0.0–1.2)
Total Protein: 7.2 g/dL (ref 6.5–8.1)

## 2024-07-02 NOTE — Telephone Encounter (Signed)
 Oral Oncology Patient Advocate Encounter   Submitted application for assistance for Verzenio to the [employer].  [employer] phone number [phone]   Will continue to follow until final determination.    Charlott Hamilton,  CPhT-Adv  she/her/hers [facility]  [facility] Specialty Pharmacy Services Pharmacy Technician Patient Advocate Specialist III [facility] Phone: [phone]  Fax: [phone] [email or web address]

## 2024-07-02 NOTE — Progress Notes (Signed)
 [facility]       Telephone: [phone]?Fax: [phone]   Oncology Clinical Pharmacist Practitioner Initial Assessment  Erica Higgins is a 53 y.o. female with a diagnosis of breast cancer. They were contacted today via in-person visit.  Indication/Regimen Abemaciclib (Verzenio) is being used appropriately for treatment of metastatic breast cancer by Dr. Charlies given. The treatment goal is: Control.     Wt Readings from Last 1 Encounters:  06/25/24 264 lb 4.8 oz (119.9 kg)    Estimated body surface area is 2.31 meters squared as calculated from the following:   Height as of 06/25/24: 5' 3 (1.6 m).   Weight as of 06/25/24: 264 lb 4.8 oz (119.9 kg).  The dosing regimen is 100 mg by mouth every 12 hours on days 1 to 28 of a 28-day cycle. This is being given in combination with tamoxifen . It is planned to continue until disease progression or unacceptable toxicity. Prescription dose and frequency assessed for appropriateness.  Patient has agreed to treatment which is documented in physician note on 06/25/2024. Counseled patient on administration, dosing, side effects, monitoring, drug-food interactions, safe handling, storage, and disposal.  Per Dr. Odean, requesting abemaciclib to make that trip.  Patient is obtaining necessary paperwork for submission.  Once approved and patient received, abemaciclib be started.  Dose Modifications Dr. Odean is starting patient at a reduced dose of abemaciclib 100 mg by mouth every 12 hours  Access Assessment Erica Higgins will be receiving abemaciclib through: TBD. Gave donated drug today 28 day supply Insurance Concerns: May be going through manufacturer assistance Start date if known: To be determined  Adherence Assessment Reviewed importance on keeping a med schedule and plan for any missed doses Barriers to adherence identified?  No  Communication and Learning Assessment Primary learner: Self Barriers to learning: No  barriers Preferred language: English Learning preferences: Listening  Allergies Allergies  Allergen Reactions   Cleocin  [Clindamycin  Hcl] Hives   Ivp Dye [Iodinated Contrast Media] Anaphylaxis   Latex Hives   Penicillins Hives   Shellfish Allergy Anaphylaxis   Adhesive [Tape]     Rash, itch     Vitals    06/25/2024    8:00 AM 06/16/2024    1:33 PM 05/28/2024    9:18 AM  Oncology Vitals  Height 160 cm  160 cm  Weight 119.886 kg 119.387 kg 121.11 kg  Weight (lbs) 264 lbs 5 oz 263 lbs 3 oz 267 lbs  BMI 46.82 kg/m2 46.62 kg/m2 47.3 kg/m2  Temp 98.3 F (36.8 C) 97.7 F (36.5 C)   Pulse Rate 81 69 86  BP 131/75 132/50 113/69  Resp 18 17   SpO2 99 % 100 % 95 %  BSA (m2) 2.31 m2 2.3 m2 2.32 m2     Laboratory Data    Latest Ref Rng & Units 07/02/2024    2:28 PM 05/20/2024    7:05 PM 10/15/2019   12:02 PM  CBC EXTENDED  WBC 4.0 - 10.5 K/uL 7.9  10.3  8.0   RBC 3.87 - 5.11 MIL/uL 4.53  4.64  4.48   Hemoglobin 12.0 - 15.0 g/dL 86.3  86.0  86.5   HCT 36.0 - 46.0 % 39.6  42.0  40.3   Platelets 150 - 400 K/uL 260  288    NEUT# 1.7 - 7.7 K/uL 4.6  6.0  4.9   Lymph# 0.7 - 4.0 K/uL 2.5  3.4  2.1        Latest Ref Rng &  Units 07/02/2024    2:28 PM 05/20/2024    7:05 PM 10/15/2019   12:02 PM  CMP  Glucose 70 - 99 mg/dL 860  882  73   BUN 6 - 20 mg/dL 14  11  10    Creatinine 0.44 - 1.00 mg/dL 8.97  9.19  9.25   Sodium 135 - 145 mmol/L 140  142  143   Potassium 3.5 - 5.1 mmol/L 4.2  3.7  4.5   Chloride 98 - 111 mmol/L 107  104  105   CO2 22 - 32 mmol/L 27  26  26    Calcium 8.9 - 10.3 mg/dL 9.0  9.2  9.0   Total Protein 6.5 - 8.1 g/dL 7.2  7.6  6.3   Total Bilirubin 0.0 - 1.2 mg/dL 0.3  0.6  <9.7   Alkaline Phos 38 - 126 U/L 77  74  88   AST 15 - 41 U/L 33  26  25   ALT 0 - 44 U/L 35  29  23    No results found for: MG Lab Results  Component Value Date   CA2729 59.4 (H) 06/16/2024    Contraindications Contraindications were reviewed? Yes Contraindications to  therapy were identified? No   Safety Precautions The following safety precautions for the use of abemaciclib were reviewed:  Changes in kidney function: importance of drinking plenty of fluids and monitoring urine output Diarrhea: we reviewed that diarrhea is common with abemaciclib and confirmed that she does have loperamide (Imodium) at home.  We reviewed how to take this medication PRN and gave her information on abemaciclib Decreased white blood cells (WBCs) and increased risk for infection: we discussed the importance of having a thermometer and what the Centers for Disease Control and Prevention (CDC) considers a fever which is 100.13F (38C) or higher.  Gave patient 24/7 triage line to call if any fevers or symptoms Decreased hemoglobin, part of red blood cells that carry iron and oxygen Fatigue Nausea and Vomiting Hepatotoxicity: reviewed to contact clinic for RUQ pain that will not subside, yellowing of eyes/skin Decreased appetite or weight loss Abdominal pain Decreased platelet count and increased risk for bleeding Venous thromboembolism (VTE): reviewed signs of deep vein thrombosis (DVT) such as leg swelling, redness, pain, or tenderness and signs of pulmonary embolism (PE) such as shortness of breath, rapid or irregular heartbeat, cough, chest pain, or lightheadedness ILD/Pneumonitis: we reviewed potential symptoms including cough, shortness, and fatigue. Handling body fluids and waste Pregnancy, sexual activity, and contraception Avoid grapefruit products Reviewed to take the medication every 12 hours (with food sometimes can be easier on the stomach) and to take it at the same time every day. Discussed proper storage and handling of abemaciclib  Medication Reconciliation Current Outpatient Medications  Medication Sig Dispense Refill   Albuterol-Budesonide (AIRSUPRA ) 90-80 MCG/ACT AERO Inhale 2 puffs into the lungs every 6 (six) hours as needed. 10.7 g 5   Aspirin 81 MG CAPS  Take 1 tablet by mouth daily.     B Complex Vitamins (B COMPLEX PO) Take by mouth.     chlorpheniramine (CHLOR-TRIMETON) 4 MG tablet Take 4 mg by mouth 2 (two) times daily as needed for allergies.     DHA-EPA-Vitamin E (OMEGA-3 COMPLEX PO) Take 1 capsule by mouth in the morning and at bedtime.     fexofenadine (ALLEGRA) 180 MG tablet Take 180 mg by mouth daily.     fluticasone (FLONASE) 50 MCG/ACT nasal spray      Magnesium   250 MG TABS Take 1 tablet by mouth daily.     melatonin 5 MG TABS Take 5 mg by mouth at bedtime.     oxybutynin (DITROPAN) 5 MG tablet Take by mouth.     tamoxifen  (NOLVADEX ) 20 MG tablet Take 1 tablet (20 mg total) by mouth daily. 90 tablet 3   topiramate  (TOPAMAX ) 50 MG tablet Take 1 tablet (50 mg total) by mouth at bedtime. 30 tablet 5   Ubrogepant  (UBRELVY ) 100 MG TABS Take 1 tablet (100 mg total) by mouth as needed. May repeat after 2 hours.  Maximum 2 tablets in 24 hours. 16 tablet 5   abemaciclib (VERZENIO) 100 MG tablet Take 1 tablet (100 mg total) by mouth 2 (two) times daily. (Patient not taking: Reported on 07/02/2024) 60 tablet 3   No current facility-administered medications for this visit.    Medication reconciliation is based on the patient's most recent medication list in the electronic medical record (EMR) including herbal products and OTC medications.   The patient's medication list was reviewed today with the patient? Yes   Drug-drug interactions (DDIs) DDIs were evaluated? Yes Significant DDIs identified? Potential interaction with metformin concentrations being increased by topiramate  and increased risk for lactic acidosis. Discussed with patient. She confirmed she is not taking metformin currently.   Drug-Food Interactions Drug-food interactions were evaluated? Yes Drug-food interactions identified? Grapefruit products  Follow-up Plan  Patient education handout given to patient Start abemaciclib 100 mg by mouth every 12 hours. Start date today  via donated drug. Trying to obtain from manufacturer assistance. Patient advocate assisting. Will give 85-month donated supply for now Continue tamoxifen  20 mg by mouth daily. Per Dr. Odean, prefers tamoxifen  since patient perimenopausal.  Dr. Gudena may want to start bone strengthener in future with zoledronic acid or denosumab 120 mg in future since patient does have bone mets. She did mention having Dr. Dannielle from [facility] send her to [facility] for radiation therapy. Discussed that Dr. Odean may want to hold abemaciclib while on xrt and she will contact him when this occurs Monitor for side effects Distress thermometer completed during in person visit and reviewed with patient. Due to score, social work referral has been sent. Jeff Cloward can follow up with clinical pharmacy as deemed necessary by Dr. Vinay Gudena going forward   Erica Higgins participated in the discussion, expressed understanding, and voiced agreement with the above plan. All questions were answered to her satisfaction. The patient was advised to contact the clinic at [phone] with any questions or concerns prior to her return visit.   I spent 60 minutes assessing the patient.  Sargent Mankey A. Lucila, PharmD, BCOP, CPP  Norleen DELENA Lucila, RPH-CPP, 07/02/2024 3:47 PM  **Disclaimer: This note was dictated with voice recognition software. Similar sounding words can inadvertently be transcribed and this note may contain transcription errors which may not have been corrected upon publication of note.**

## 2024-07-03 ENCOUNTER — Inpatient Hospital Stay: Admitting: Licensed Clinical Social Worker

## 2024-07-03 NOTE — Progress Notes (Signed)
 [facility] Clinical Social Work  Initial Assessment   Erica Higgins is a 53 y.o. year old female contacted by phone. Clinical Social Work was referred by clinical pharmacist for distress screen needs.   SDOH (Social Determinants of Health) assessments performed: No   SDOH Screenings   Food Insecurity: No Food Insecurity (06/26/2024)   Received from [facility]  Transportation Needs: No Transportation Needs (06/26/2024)   Received from [facility]  Utilities: Low Risk  (06/26/2024)   Received from [facility]  Depression [id number]): Low Risk  (10/17/2020)  Physical Activity: Inactive (07/14/2021)   Received from [facility]  Stress: No Stress Concern Present (07/14/2021)   Received from [facility]  Tobacco Use: Medium Risk (05/28/2024)  Health Literacy: Low Risk  (07/26/2023)   Received from [facility]    PHQ 2/9:    10/17/2020    3:12 PM  Depression screen PHQ 2/9  Decreased Interest   Down, Depressed, Hopeless   PHQ - 2 Score      Information is confidential and restricted. Go to Review Flowsheets to unlock data.     Distress Screen completed: Yes    07/02/2024    3:00 PM  ONCBCN DISTRESS SCREENING  Screening Type Initial Screening  How much distress have you been experiencing in the past week? (0-10) 7      Family/Social Information:  Housing Arrangement: patient lives with her twin 64 yo children. She is a single parent. Kids are with her full-time Transportation concerns: no  Employment: Working full time for [employer]. 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  Income source: Employment Financial concerns: Yes, with cost of medication. Pharmacy team is working on medication assistance through manufacturer Type of concern: Medical bills Food access concerns: no Religious or spiritual practice: Not known Advanced directives: Yes-but needs to update them Services Currently in place:  [employer]  Coping/ Adjustment to  diagnosis: Patient understands treatment plan and what happens next? 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; updating will & healthcare AD Current coping skills/ strengths: Capable of independent living , Communication skills , and Motivation for treatment/growth     SUMMARY: Current SDOH Barriers:  Medication procurement due to costs  Clinical Social Work Clinical Goal(s):  Patient will continue to follow-up with pharmacy team on assistance for medication  Interventions: Discussed common feeling and emotions when being diagnosed with cancer, and the importance of support during treatment Informed patient of the support team roles and support services at [facility] Provided CSW contact information and encouraged patient to call with any questions or concerns Provided education and assistance to client regarding Advanced Directives. Patient is going to see if someone locally (in [place]) can assist with will & medical Adv Directives   Follow Up Plan: Patient will contact CSW with any support or resource needs Patient verbalizes understanding of plan: Yes    Erica Higgins E Darvell Monteforte, LCSW Clinical Social Worker [facility]

## 2024-07-08 ENCOUNTER — Encounter: Payer: Self-pay | Admitting: Hematology and Oncology

## 2024-07-09 LAB — GUARDANT 360

## 2024-07-09 NOTE — Telephone Encounter (Signed)
 Oral Oncology Patient Advocate Encounter   Received notification that the application for assistance for Verzenio through [employer] has been denied due to Needing  an appeal denial.   [employer]  phone number [phone] .   I will continue to follow up    Charlott Hamilton,  CPhT-Adv  she/her/hers [facility]  [facility] Pharmacy Technician Patient Advocate Specialist III [facility] Phone: [phone]  Fax: [phone] [email or web address]

## 2024-07-09 NOTE — Telephone Encounter (Signed)
 Oral Oncology Patient Advocate Encounter  Follow up regarding PAP Application through [employer] for Verzenio.  They want a copy of an appeal that has been denied by the insurance company.     Charlott Hamilton,  CPhT-Adv  she/her/hers [facility]  [facility] Pharmacy Technician Patient Advocate Specialist III [facility] Phone: [phone]  Fax: [phone] [email or web address]

## 2024-07-13 ENCOUNTER — Other Ambulatory Visit: Payer: Self-pay | Admitting: Pharmacist

## 2024-07-13 ENCOUNTER — Encounter: Payer: Self-pay | Admitting: Pharmacist

## 2024-07-13 DIAGNOSIS — C50511 Malignant neoplasm of lower-outer quadrant of right female breast: Secondary | ICD-10-CM

## 2024-07-13 NOTE — Progress Notes (Signed)
 Per Dr. Gudena, adding FSH and Estradiol  to check menopausal status. If post-menopausal, then he would like to switch her from tamoxifen  to an AI in combination with abemaciclib.  Scheduling is aware and Dr. Gara nurse will let patient know to come in for labs this week.  Milady Fleener A. Lucila, PharmD, BCOP, CPP 07/13/2024 4:21 PM

## 2024-07-16 ENCOUNTER — Inpatient Hospital Stay (HOSPITAL_BASED_OUTPATIENT_CLINIC_OR_DEPARTMENT_OTHER): Admitting: Hematology and Oncology

## 2024-07-16 ENCOUNTER — Inpatient Hospital Stay: Attending: Hematology and Oncology

## 2024-07-16 VITALS — BP 130/70 | HR 64 | Temp 98.7°F | Resp 18 | Ht 63.0 in | Wt 261.6 lb

## 2024-07-16 DIAGNOSIS — Z5111 Encounter for antineoplastic chemotherapy: Secondary | ICD-10-CM | POA: Insufficient documentation

## 2024-07-16 DIAGNOSIS — R197 Diarrhea, unspecified: Secondary | ICD-10-CM | POA: Diagnosis not present

## 2024-07-16 DIAGNOSIS — C50511 Malignant neoplasm of lower-outer quadrant of right female breast: Secondary | ICD-10-CM | POA: Diagnosis present

## 2024-07-16 DIAGNOSIS — N951 Menopausal and female climacteric states: Secondary | ICD-10-CM | POA: Insufficient documentation

## 2024-07-16 DIAGNOSIS — Z170 Estrogen receptor positive status [ER+]: Secondary | ICD-10-CM

## 2024-07-16 DIAGNOSIS — Z79810 Long term (current) use of selective estrogen receptor modulators (SERMs): Secondary | ICD-10-CM | POA: Diagnosis not present

## 2024-07-16 LAB — CBC WITH DIFFERENTIAL (CANCER CENTER ONLY)
Abs Immature Granulocytes: 0.02 K/uL (ref 0.00–0.07)
Basophils Absolute: 0 K/uL (ref 0.0–0.1)
Basophils Relative: 1 %
Eosinophils Absolute: 0.1 K/uL (ref 0.0–0.5)
Eosinophils Relative: 1 %
HCT: 39.7 % (ref 36.0–46.0)
Hemoglobin: 13.1 g/dL (ref 12.0–15.0)
Immature Granulocytes: 0 %
Lymphocytes Relative: 38 %
Lymphs Abs: 2.6 K/uL (ref 0.7–4.0)
MCH: 29.7 pg (ref 26.0–34.0)
MCHC: 33 g/dL (ref 30.0–36.0)
MCV: 90 fL (ref 80.0–100.0)
Monocytes Absolute: 0.4 K/uL (ref 0.1–1.0)
Monocytes Relative: 6 %
Neutro Abs: 3.7 K/uL (ref 1.7–7.7)
Neutrophils Relative %: 54 %
Platelet Count: 259 K/uL (ref 150–400)
RBC: 4.41 MIL/uL (ref 3.87–5.11)
RDW: 12.7 % (ref 11.5–15.5)
WBC Count: 6.9 K/uL (ref 4.0–10.5)
nRBC: 0 % (ref 0.0–0.2)

## 2024-07-16 LAB — CMP (CANCER CENTER ONLY)
ALT: 22 U/L (ref 0–44)
AST: 20 U/L (ref 15–41)
Albumin: 4 g/dL (ref 3.5–5.0)
Alkaline Phosphatase: 77 U/L (ref 38–126)
Anion gap: 7 (ref 5–15)
BUN: 13 mg/dL (ref 6–20)
CO2: 28 mmol/L (ref 22–32)
Calcium: 9.3 mg/dL (ref 8.9–10.3)
Chloride: 105 mmol/L (ref 98–111)
Creatinine: 1.16 mg/dL — ABNORMAL HIGH (ref 0.44–1.00)
GFR, Estimated: 57 mL/min — ABNORMAL LOW (ref >60)
Glucose, Bld: 111 mg/dL — ABNORMAL HIGH (ref 70–99)
Potassium: 3.9 mmol/L (ref 3.5–5.1)
Sodium: 140 mmol/L (ref 135–145)
Total Bilirubin: 0.3 mg/dL (ref 0.0–1.2)
Total Protein: 7.1 g/dL (ref 6.5–8.1)

## 2024-07-16 NOTE — Assessment & Plan Note (Signed)
 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  Joint pains and stiffness: Much better on 10 mg dose 2. 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  Breast exam 11/18/2023: Benign 2.  Mammogram left breast 12/04/2021: Benign breast density category C 1 patient needs a new mammogram CT Chest 01/27/2023: Bilateral pulmonary nodules 4 mm, felt to be low risk 3.  CT CAP 06/12/2024: Stable 4 mm nodule left lung apex and 3 mm nodule superior segment left lower lobe 4.  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  MRI cervical spine: 06/09/2024: Solitary suspicious C2 vertebral body lesion 6. PET-CT 06/22/24: Hypermetabolic C2 vertebral body corresponding to a suspicious lesion.  No additional skeletal activity or metastases.   Treatment plan: Discussed with interventional radiology: C2 lesion cannot be biopsied. Radiation oncology and [place] to evaluate for palliative radiation Menopause labs: Estradiol  19.8, FSH 38.9 (patient is perimenopausal) therefore we do not think she would be ideal candidate to switch to aromatase inhibitors yet. [id number] pending Recommendation: Increase dosage of tamoxifen  to 20 mg a day and recheck of menopause labs again in 3 months.  Recommend addition of Verzinio to tamoxifen .   Abemaciclib toxicities:  Her insurance is not covering Verzinio because we are using it in combination with tamoxifen . We will check if she is in menopause with FSH and estradiol .  We can switch her to aromatase inhibitors if she is in menopause.

## 2024-07-16 NOTE — Progress Notes (Signed)
 Patient Care Team: Vida Mardy VEAR DEVONNA as PCP - General (Physician Assistant) Merlynn Ozell SAUNDERS, DPM as Attending Physician (Podiatry) Odean Potts, MD as Consulting Physician (Hematology and Oncology) Vanderbilt Ned, MD as Consulting Physician (General Surgery) Vallerie Verneita RAMAN, MD as Referring Physician (Radiation Oncology) Crawford Morna Pickle, NP as Nurse Practitioner (Hematology and Oncology) Maree Isles, MD as Referring Physician (Internal Medicine) Skeet Juliene SAUNDERS, DO as Consulting Physician (Neurology)  DIAGNOSIS:  Encounter Diagnosis  Name Primary?   Malignant neoplasm of lower-outer quadrant of right breast of female, estrogen receptor positive (HCC) Yes    SUMMARY OF ONCOLOGIC HISTORY: Oncology History  Malignant neoplasm of lower-outer quadrant of right breast of female, estrogen receptor positive (HCC)  11/06/2017 Initial Diagnosis   Palpable right breast mass which on radiologic evaluation was epidermoid cyst; incidentally found additional right breast mass at 8 o'clock position: 1.6 cm: IDC grade 2, ER 90%, PR 100%, Ki-67 2%, HER-2 negative, T1CN0 stage I a AJCC 8   11/25/2017 Genetic Testing   The Common Hereditary Cancer Panel offered by [facility] includes sequencing and/or deletion duplication testing of the following 47 genes: APC, ATM, AXIN2, BARD1, BMPR1A, BRCA1, BRCA2, BRIP1, CDH1, CDKN2A (p14ARF), CDKN2A (p16INK4a), CKD4, CHEK2, CTNNA1, DICER1, EPCAM (Deletion/duplication testing only), GREM1 (promoter region deletion/duplication testing only), KIT, MEN1, MLH1, MSH2, MSH3, MSH6, MUTYH, NBN, NF1, NHTL1, PALB2, PDGFRA, PMS2, POLD1, POLE, PTEN, RAD50, RAD51C, RAD51D, SDHB, SDHC, SDHD, SMAD4, SMARCA4. STK11, TP53, TSC1, TSC2, and VHL.  The following genes were evaluated for sequence changes only: SDHA and HOXB13 c.251G>A variant only.  Results: Negative, no pathogenic variants identified.  The date of this test report is 11/25/2017.    12/05/2017 Surgery   Right  mastectomy: Invasive ductal carcinoma 1.1 cm with lymphovascular invasion, 1/5 lymph nodes positive, grade 2, ER 90%, PR 100%, Ki-67 2%, HER-2 negative T1bN1 Stage 1B.  Mammaprint low risk   01/31/2018 - 03/14/2018 Radiation Therapy   Adjuvant XRT at [facility]   04/2018 -  Anti-estrogen oral therapy   Tamoxifen  daily   06/25/2018 Cancer Staging   Staging form: Breast, AJCC 8th Edition - Pathologic: Stage IA (pT1b, pN1, cM0, G2, ER+, PR+, HER2-) - Signed by Crawford Morna Pickle, NP on 06/25/2018     CHIEF COMPLIANT: Follow-up on Verzinio  HISTORY OF PRESENT ILLNESS:   History of Present Illness Erica Higgins is a 53 year old female with breast cancer who presents for follow-up regarding her menopausal status and treatment management. She was referred by a local radiation oncologist to a specialized center in [facility] for radiation therapy due to the complexity of her case.  She is undergoing treatment for breast cancer with Verzenio and tamoxifen . She experiences occasional mild diarrhea, managed with Imodium, and has stable bowel movements without significant side effects affecting her blood work.  Her menopausal status is under monitoring as insurance requires confirmation for continued treatment. She experiences nausea, which she attributes to dietary choices.  She awaits scheduling for a comprehensive evaluation, including an EMG, to address numbness in her cheek.     ALLERGIES:  is allergic to cleocin  [clindamycin  hcl], ivp dye [iodinated contrast media], latex, penicillins, shellfish allergy, and adhesive [tape].  MEDICATIONS:  Current Outpatient Medications  Medication Sig Dispense Refill   abemaciclib (VERZENIO) 100 MG tablet Take 1 tablet (100 mg total) by mouth 2 (two) times daily. (Patient not taking: Reported on 07/02/2024) 60 tablet 3   Albuterol-Budesonide (AIRSUPRA ) 90-80 MCG/ACT AERO Inhale 2 puffs into the lungs every 6 (six)  hours as needed. 10.7 g 5   Aspirin 81 MG  CAPS Take 1 tablet by mouth daily.     B Complex Vitamins (B COMPLEX PO) Take by mouth.     chlorpheniramine (CHLOR-TRIMETON) 4 MG tablet Take 4 mg by mouth 2 (two) times daily as needed for allergies.     DHA-EPA-Vitamin E (OMEGA-3 COMPLEX PO) Take 1 capsule by mouth in the morning and at bedtime.     fexofenadine (ALLEGRA) 180 MG tablet Take 180 mg by mouth daily.     fluticasone (FLONASE) 50 MCG/ACT nasal spray      Magnesium  250 MG TABS Take 1 tablet by mouth daily.     melatonin 5 MG TABS Take 5 mg by mouth at bedtime.     oxybutynin (DITROPAN) 5 MG tablet Take by mouth.     tamoxifen  (NOLVADEX ) 20 MG tablet Take 1 tablet (20 mg total) by mouth daily. 90 tablet 3   topiramate  (TOPAMAX ) 50 MG tablet Take 1 tablet (50 mg total) by mouth at bedtime. 30 tablet 5   Ubrogepant  (UBRELVY ) 100 MG TABS Take 1 tablet (100 mg total) by mouth as needed. May repeat after 2 hours.  Maximum 2 tablets in 24 hours. 16 tablet 5   No current facility-administered medications for this visit.    PHYSICAL EXAMINATION: ECOG PERFORMANCE STATUS: 1 - Symptomatic but completely ambulatory  Vitals:   07/16/24 1550  BP: 130/70  Pulse: 64  Resp: 18  Temp: 98.7 F (37.1 C)  SpO2: 100%   Filed Weights   07/16/24 1550  Weight: 261 lb 9.6 oz (118.7 kg)    Physical Exam   (exam performed in the presence of a chaperone)  LABORATORY DATA:  I have reviewed the data as listed    Latest Ref Rng & Units 07/16/2024    3:22 PM 07/02/2024    2:28 PM 05/20/2024    7:05 PM  CMP  Glucose 70 - 99 mg/dL 888  860  882   BUN 6 - 20 mg/dL 13  14  11    Creatinine 0.44 - 1.00 mg/dL 8.83  8.97  9.19   Sodium 135 - 145 mmol/L 140  140  142   Potassium 3.5 - 5.1 mmol/L 3.9  4.2  3.7   Chloride 98 - 111 mmol/L 105  107  104   CO2 22 - 32 mmol/L 28  27  26    Calcium 8.9 - 10.3 mg/dL 9.3  9.0  9.2   Total Protein 6.5 - 8.1 g/dL 7.1  7.2  7.6   Total Bilirubin 0.0 - 1.2 mg/dL 0.3  0.3  0.6   Alkaline Phos 38 - 126 U/L  77  77  74   AST 15 - 41 U/L 20  33  26   ALT 0 - 44 U/L 22  35  29     Lab Results  Component Value Date   WBC 6.9 07/16/2024   HGB 13.1 07/16/2024   HCT 39.7 07/16/2024   MCV 90.0 07/16/2024   PLT 259 07/16/2024   NEUTROABS 3.7 07/16/2024    ASSESSMENT & PLAN:  Malignant neoplasm of lower-outer quadrant of right breast of female, estrogen receptor positive (HCC) 12/05/17: Right mastectomy: Invasive ductal carcinoma 1.1 cm with lymphovascular invasion, 1/5 lymph nodes positive, grade 2, ER 90%, PR 100%, Ki-67 2%, HER-2 negative T1bN1 Stage 1B Mammaprint 12/21/2017: Luminal type A low risk Adjuvant radiation therapy completed   Plan: Tamoxifen  20 mg daily August 2019 dose  reduced to 10 mg on June 19, 2019 because of severe joint pains and stiffness   Tamoxifen  toxicities:  1.  Joint pains and stiffness: Much better on 10 mg dose 2. 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  Breast exam 11/18/2023: Benign 2.  Mammogram left breast 12/04/2021: Benign breast density category C 1 patient needs a new mammogram CT Chest 01/27/2023: Bilateral pulmonary nodules 4 mm, felt to be low risk 3.  CT CAP 06/12/2024: Stable 4 mm nodule left lung apex and 3 mm nodule superior segment left lower lobe 4.  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  MRI cervical spine: 06/09/2024: Solitary suspicious C2 vertebral body lesion 6. PET-CT 06/22/24: Hypermetabolic C2 vertebral body corresponding to a suspicious lesion.  No additional skeletal activity or metastases.   Treatment plan: Discussed with interventional radiology: C2 lesion cannot be biopsied. Radiation oncology and [facility] to evaluate for palliative radiation Menopause labs: Estradiol  19.8, FSH 38.9 (patient is perimenopausal) therefore we do not think she would be ideal candidate to switch to aromatase inhibitors yet. [id number] pending Recommendation: Increase dosage of  tamoxifen  to 20 mg a day and recheck of menopause labs again in 3 months.  Recommend addition of Verzinio to tamoxifen .   Abemaciclib toxicities: Tolerating it extremely well without any problems or concerns. Mild intermittent diarrhea: Not bothering her. Menopause blood work performed today.  Bone metastasis: Patient is going to see a specialist at [facility] regarding radiation to the bone.  Her insurance is not covering Verzinio because we are using it in combination with tamoxifen . We will check if she is in menopause with FSH and estradiol .  We can switch her to aromatase inhibitors if she is in menopause.    No orders of the defined types were placed in this encounter.  The patient has a good understanding of the overall plan. she agrees with it. she will call with any problems that may develop before the next visit here.  I personally spent a total of 30 minutes in the care of the patient today including preparing to see the patient, getting/reviewing separately obtained history, performing a medically appropriate exam/evaluation, counseling and educating, placing orders, referring and communicating with other health care professionals, documenting clinical information in the EHR, independently interpreting results, communicating results, and coordinating care.   Viinay K Daryl Quiros, MD 07/16/24

## 2024-07-17 LAB — FOLLICLE STIMULATING HORMONE: FSH: 32.8 m[IU]/mL

## 2024-07-20 LAB — ESTRADIOL, ULTRA SENS: Estradiol, Sensitive: 120.7 pg/mL

## 2024-07-21 ENCOUNTER — Other Ambulatory Visit (HOSPITAL_COMMUNITY): Payer: Self-pay

## 2024-07-21 ENCOUNTER — Other Ambulatory Visit: Payer: Self-pay | Admitting: Pharmacist

## 2024-07-21 ENCOUNTER — Inpatient Hospital Stay: Admitting: Hematology and Oncology

## 2024-07-21 DIAGNOSIS — C50511 Malignant neoplasm of lower-outer quadrant of right female breast: Secondary | ICD-10-CM | POA: Diagnosis not present

## 2024-07-21 DIAGNOSIS — Z170 Estrogen receptor positive status [ER+]: Secondary | ICD-10-CM

## 2024-07-21 MED ORDER — ABEMACICLIB 100 MG PO TABS: 100.0000 mg | ORAL_TABLET | Freq: Two times a day (BID) | ORAL | 0 refills | Status: DC

## 2024-07-21 MED ORDER — ANASTROZOLE 1 MG PO TABS: 1.0000 mg | ORAL_TABLET | Freq: Every day | ORAL | 3 refills | Status: DC

## 2024-07-21 NOTE — Progress Notes (Signed)
 HEMATOLOGY-ONCOLOGY TELEPHONE VISIT PROGRESS NOTE  I connected with our patient on 07/21/24 at  8:15 AM EST by telephone and verified that I am speaking with the correct person using two identifiers.  I discussed the limitations, risks, security and privacy concerns of performing an evaluation and management service by telephone and the availability of in person appointments.  I also discussed with the patient that there may be a patient responsible charge related to this service. The patient expressed understanding and agreed to proceed.   History of Present Illness: Follow-up to discuss treatment plan  History of Present Illness Erica Higgins is a 53 year old female who presents for management of menopause status and hormone therapy options.  She is undergoing evaluation for menopause status with recent blood work showing an estradiol  level of 120. This level affects her treatment plan due to insurance criteria for medication coverage for Verzinio. She is currently taking tamoxifen .  She has been tolerating Verzinio extremely well.    Oncology History  Malignant neoplasm of lower-outer quadrant of right breast of female, estrogen receptor positive (HCC)  11/06/2017 Initial Diagnosis   Palpable right breast mass which on radiologic evaluation was epidermoid cyst; incidentally found additional right breast mass at 8 o'clock position: 1.6 cm: IDC grade 2, ER 90%, PR 100%, Ki-67 2%, HER-2 negative, T1CN0 stage I a AJCC 8   11/25/2017 Genetic Testing   The Common Hereditary Cancer Panel offered by [facility] includes sequencing and/or deletion duplication testing of the following 47 genes: APC, ATM, AXIN2, BARD1, BMPR1A, BRCA1, BRCA2, BRIP1, CDH1, CDKN2A (p14ARF), CDKN2A (p16INK4a), CKD4, CHEK2, CTNNA1, DICER1, EPCAM (Deletion/duplication testing only), GREM1 (promoter region deletion/duplication testing only), KIT, MEN1, MLH1, MSH2, MSH3, MSH6, MUTYH, NBN, NF1, NHTL1, PALB2, PDGFRA, PMS2, POLD1, POLE, PTEN,  RAD50, RAD51C, RAD51D, SDHB, SDHC, SDHD, SMAD4, SMARCA4. STK11, TP53, TSC1, TSC2, and VHL.  The following genes were evaluated for sequence changes only: SDHA and HOXB13 c.251G>A variant only.  Results: Negative, no pathogenic variants identified.  The date of this test report is 11/25/2017.    12/05/2017 Surgery   Right mastectomy: Invasive ductal carcinoma 1.1 cm with lymphovascular invasion, 1/5 lymph nodes positive, grade 2, ER 90%, PR 100%, Ki-67 2%, HER-2 negative T1bN1 Stage 1B.  Mammaprint low risk   01/31/2018 - 03/14/2018 Radiation Therapy   Adjuvant XRT at [facility]   04/2018 -  Anti-estrogen oral therapy   Tamoxifen  daily   06/25/2018 Cancer Staging   Staging form: Breast, AJCC 8th Edition - Pathologic: Stage IA (pT1b, pN1, cM0, G2, ER+, PR+, HER2-) - Signed by Crawford Morna Pickle, NP on 06/25/2018   07/03/2024 -  Anti-estrogen oral therapy   Verzenio      REVIEW OF SYSTEMS:   Constitutional: Denies fevers, chills or abnormal weight loss All other systems were reviewed with the patient and are negative. Observations/Objective:     Assessment Plan:  Malignant neoplasm of lower-outer quadrant of right breast of female, estrogen receptor positive (HCC) 12/05/17: Right mastectomy: Invasive ductal carcinoma 1.1 cm with lymphovascular invasion, 1/5 lymph nodes positive, grade 2, ER 90%, PR 100%, Ki-67 2%, HER-2 negative T1bN1 Stage 1B Mammaprint 12/21/2017: Luminal type A low risk Adjuvant radiation therapy completed   Plan: Tamoxifen  20 mg daily August 2019 dose reduced to 10 mg on June 19, 2019 because of severe joint pains and stiffness   Tamoxifen  toxicities:  1.  Joint pains and stiffness: Much better on 10 mg dose 2. 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  Breast exam 11/18/2023: Benign 2.  Mammogram left breast 12/04/2021: Benign breast density category C 1 patient needs a new mammogram CT Chest  01/27/2023: Bilateral pulmonary nodules 4 mm, felt to be low risk 3.  CT CAP 06/12/2024: Stable 4 mm nodule left lung apex and 3 mm nodule superior segment left lower lobe 4.  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  MRI cervical spine: 06/09/2024: Solitary suspicious C2 vertebral body lesion 6. PET-CT 06/22/24: Hypermetabolic C2 vertebral body corresponding to a suspicious lesion.  No additional skeletal activity or metastases.   Treatment plan: Discussed with interventional radiology: C2 lesion cannot be biopsied. Radiation oncology and [facility] to evaluate for palliative radiation Menopause labs: Estradiol  19.8, FSH 38.9 (patient is perimenopausal) therefore we do not think she would be ideal candidate to switch to aromatase inhibitors yet. Guardant360 06/23/2024: No mutations, MSI high not detected, tumor fraction less than 0.05% Current treatment: Tamoxifen  with abemaciclib started 07/02/2024 (her insurance is not approving abemaciclib in combination with tamoxifen )   Abemaciclib toxicities: Tolerating it extremely well without any problems or concerns. Mild intermittent diarrhea: Not bothering her.  Recommendation: Ovarian function suppression with Zoladex injections monthly with anastrozole along with abemaciclib. Patient would like to receive these injections at [facility].  I will request this to be set up at [facility]     I discussed the assessment and treatment plan with the patient. The patient was provided an opportunity to ask questions and all were answered. The patient agreed with the plan and demonstrated an understanding of the instructions. The patient was advised to call back or seek an in-person evaluation if the symptoms worsen or if the condition fails to improve as anticipated.   I provided 20 minutes of non-face-to-face time during this encounter.  This includes time for charting and coordination of care   Naomi MARLA Chad, MD

## 2024-07-21 NOTE — Assessment & Plan Note (Signed)
 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  Joint pains and stiffness: Much better on 10 mg dose 2. 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  Breast exam 11/18/2023: Benign 2.  Mammogram left breast 12/04/2021: Benign breast density category C 1 patient needs a new mammogram CT Chest 01/27/2023: Bilateral pulmonary nodules 4 mm, felt to be low risk 3.  CT CAP 06/12/2024: Stable 4 mm nodule left lung apex and 3 mm nodule superior segment left lower lobe 4.  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  MRI cervical spine: 06/09/2024: Solitary suspicious C2 vertebral body lesion 6. PET-CT 06/22/24: Hypermetabolic C2 vertebral body corresponding to a suspicious lesion.  No additional skeletal activity or metastases.   Treatment plan: Discussed with interventional radiology: C2 lesion cannot be biopsied. Radiation oncology and [facility] to evaluate for palliative radiation Menopause labs: Estradiol  19.8, FSH 38.9 (patient is perimenopausal) therefore we do not think she would be ideal candidate to switch to aromatase inhibitors yet. Guardant360 06/23/2024: No mutations, MSI high not detected, tumor fraction less than 0.05% Current treatment: Tamoxifen  with abemaciclib started 07/02/2024 (her insurance is not approving abemaciclib in combination with tamoxifen )   Abemaciclib toxicities: Tolerating it extremely well without any problems or concerns. Mild intermittent diarrhea: Not bothering her.  Recommendation: Ovarian function suppression with Zoladex  injections monthly with anastrozole along with abemaciclib.

## 2024-07-22 ENCOUNTER — Other Ambulatory Visit (HOSPITAL_COMMUNITY): Payer: Self-pay

## 2024-07-22 ENCOUNTER — Telehealth: Payer: Self-pay

## 2024-07-22 NOTE — Telephone Encounter (Signed)
 Oral Oncology Patient Advocate Encounter   Received notification that prior authorization for Verzenio  is required.   PA submitted on 07/22/24 Key [id number] Status is pending      Charlott Hamilton,  CPhT-Adv  she/her/hers [facility]  [facility] Pharmacy Technician Patient Advocate Specialist III [facility] Phone: [phone]  Fax: [phone] [email or web address]

## 2024-07-22 NOTE — Telephone Encounter (Signed)
 Oral Oncology Patient Advocate Encounter  Prior Authorization for Verzenio  has been approved.    PA# [id number] Effective dates: 06/22/2024 through 13/31/2099  Cost exceeds max override has been applied per [employer]  Patient can only fill at a retail pharmacy 2 fills before having to get 90 day supplies via [employer] [facility].  Patients co-pay is $43.00.     Charlott Hamilton,  CPhT-Adv  she/her/hers [facility] [employer] Pharmacy Technician Patient Advocate Specialist III [facility] Phone: [phone]  Fax: [phone] [email or web address]

## 2024-07-24 ENCOUNTER — Encounter: Payer: Self-pay | Admitting: Hematology and Oncology

## 2024-07-28 NOTE — Progress Notes (Unsigned)
 [facility]        Telephone: [phone]?Fax: [phone]   Oncology Clinical Pharmacist Practitioner Progress Note   Erica Higgins was contacted via in-person to discuss her chemotherapy regimen for abemaciclib  which they receive under the care of Dr. Vinay Gudena.  Current treatment regimen and start date Abemaciclib  (07/03/24) Anastrozole  (07/21/24) Goserelin (07/29/24)  Interval History She continues on abemaciclib  100 mg by mouth every 12 hours on days 1 to 28 of a 28-day cycle. This is being given in combination with anastrozole  and goserelin. Therapy is planned to continue until disease progression or unacceptable toxicity. She last saw Dr. Gudena on 07/21/24 and clinical pharmacy on 07/02/24.  At her last visit with Dr. Gudena, he switched her to goserelin which will be starting today and anastrozole  started 07/21/24 in combination with abemaciclib .  Response to Therapy She is doing well. Started first goserelin today and then they will be at [facility] per patient request. Creatinine is decreased but just slightly above ULN. She is drinking plenty of fluids. We will see her back in 2 weeks and in 4 weeks.  She is working with [facility] for radiation treatments. They have recommended 3 treatments and she will hold abemaciclib  4 days pre and post. Labs, vitals, treatment parameters, and manufacturer guidelines assessing toxicity were reviewed with Erica Higgins today. Based on these values, patient is in agreement to continue abemaciclib  therapy at this time.  Allergies Allergies  Allergen Reactions   Cleocin  [Clindamycin  Hcl] Hives   Ivp Dye [Iodinated Contrast Media] Anaphylaxis   Latex Hives   Penicillins Hives   Shellfish Allergy Anaphylaxis   Adhesive [Tape]     Rash, itch     Vitals    07/29/2024    3:58 PM 07/29/2024    3:16 PM 07/16/2024    3:50 PM  Oncology Vitals  Height   160 cm  Weight 118.752 kg  118.661 kg  Weight (lbs) 261 lbs 13 oz  261 lbs 10  oz  BMI 46.38 kg/m2  46.34 kg/m2  Temp 98 F (36.7 C)  98.7 F (37.1 C)  Pulse Rate 71 71 64  BP 128/62 128/62 130/70  Resp 18 18 18   SpO2 100 % 100 % 100 %  BSA (m2) 2.3 m2  2.3 m2    Laboratory Data    Latest Ref Rng & Units 07/29/2024    2:54 PM 07/16/2024    3:22 PM 07/02/2024    2:28 PM  CBC EXTENDED  WBC 4.0 - 10.5 K/uL 6.2  6.9  7.9   RBC 3.87 - 5.11 MIL/uL 4.19  4.41  4.53   Hemoglobin 12.0 - 15.0 g/dL 87.3  86.8  86.3   HCT 36.0 - 46.0 % 37.9  39.7  39.6   Platelets 150 - 400 K/uL 253  259  260   NEUT# 1.7 - 7.7 K/uL 3.4  3.7  4.6   Lymph# 0.7 - 4.0 K/uL 2.2  2.6  2.5        Latest Ref Rng & Units 07/29/2024    2:54 PM 07/16/2024    3:22 PM 07/02/2024    2:28 PM  CMP  Glucose 70 - 99 mg/dL 845  888  860   BUN 6 - 20 mg/dL 10  13  14    Creatinine 0.44 - 1.00 mg/dL 8.98  8.83  8.97   Sodium 135 - 145 mmol/L 142  140  140   Potassium 3.5 - 5.1 mmol/L 3.7  3.9  4.2   Chloride 98 - 111 mmol/L 107  105  107   CO2 22 - 32 mmol/L 25  28  27    Calcium 8.9 - 10.3 mg/dL 9.2  9.3  9.0   Total Protein 6.5 - 8.1 g/dL 7.0  7.1  7.2   Total Bilirubin 0.0 - 1.2 mg/dL 0.2  0.3  0.3   Alkaline Phos 38 - 126 U/L 75  77  77   AST 15 - 41 U/L 30  20  33   ALT 0 - 44 U/L 23  22  35      Adverse Effects Assessment Serum creatinine: trending down  Adherence Assessment Erica Higgins reports missing 0 doses over the past 1 weeks.   Reason for missed dose: n/a Patient was re-educated on importance of adherence.   Access Assessment Erica Higgins is currently receiving her abemaciclib  through [facility]  Insurance concerns:  none  Medication Reconciliation The patient's medication list was reviewed today with the patient? Yes New medications or herbal supplements have recently been started? No  Any medications have been discontinued? No  The medication list was updated and reconciled based on the patient's most recent medication list in the electronic medical  record (EMR) including herbal products and OTC medications.   Medications Current Outpatient Medications  Medication Sig Dispense Refill   abemaciclib  (VERZENIO ) 100 MG tablet Take 1 tablet (100 mg total) by mouth 2 (two) times daily. Swallow tablets whole. Do not chew, crush, or split tablets before swallowing. 56 tablet 0   Albuterol-Budesonide (AIRSUPRA ) 90-80 MCG/ACT AERO Inhale 2 puffs into the lungs every 6 (six) hours as needed. 10.7 g 5   anastrozole  (ARIMIDEX ) 1 MG tablet Take 1 tablet (1 mg total) by mouth daily. 90 tablet 3   Aspirin 81 MG CAPS Take 1 tablet by mouth daily.     B Complex Vitamins (B COMPLEX PO) Take by mouth.     chlorpheniramine (CHLOR-TRIMETON) 4 MG tablet Take 4 mg by mouth 2 (two) times daily as needed for allergies.     DHA-EPA-Vitamin E (OMEGA-3 COMPLEX PO) Take 1 capsule by mouth in the morning and at bedtime.     fexofenadine (ALLEGRA) 180 MG tablet Take 180 mg by mouth daily.     fluticasone (FLONASE) 50 MCG/ACT nasal spray      Magnesium  250 MG TABS Take 1 tablet by mouth daily.     melatonin 5 MG TABS Take 5 mg by mouth at bedtime.     oxybutynin (DITROPAN) 5 MG tablet Take by mouth.     topiramate  (TOPAMAX ) 50 MG tablet Take 1 tablet (50 mg total) by mouth at bedtime. 30 tablet 5   Ubrogepant  (UBRELVY ) 100 MG TABS Take 1 tablet (100 mg total) by mouth as needed. May repeat after 2 hours.  Maximum 2 tablets in 24 hours. 16 tablet 5   No current facility-administered medications for this visit.   Drug-Drug Interactions (DDIs) DDIs were evaluated? Yes Significant DDIs? No  The patient was instructed to speak with their health care provider and/or the oral chemotherapy pharmacist before starting any new drug, including prescription or over the counter, natural / herbal products, or vitamins.  Supportive Care Diarrhea: we reviewed that diarrhea is common with abemaciclib  and confirmed that she does have loperamide (Imodium) at home.  We reviewed how to  take this medication PRN. Neutropenia: we discussed the importance of having a thermometer and what the Centers for Disease Control and Prevention (CDC)  considers a fever which is 100.34F (38C) or higher.  Gave patient 24/7 triage line to call if any fevers or symptoms. ILD/Pneumonitis: we reviewed potential symptoms including cough, shortness, and fatigue.  VTE: reviewed signs of DVT such as leg swelling, redness, pain, or tenderness and signs of PE such as shortness of breath, rapid or irregular heartbeat, cough, chest pain, or lightheadedness. Reviewed to take the medication every 12 hours (with food sometimes can be easier on the stomach) and to take it at the same time every day. Hepatotoxicity:WNL Drug interactions with grapefruit products  Dosing Assessment Hepatic adjustments needed? No  Renal adjustments needed? No  Toxicity adjustments needed? No  The current dosing regimen is appropriate to continue at this time.  Follow-Up Plan Continue abemaciclib  100 by mouth every 12 hours. She will have radiation txs through [facility] tenatively 3. They would like her to hold abemaciclib  for 4 days pre and post txs Continue anastrozole  1 mg by mouth daily Start goserelin 3.6 mg SubQ every 28 days. Will start today and then wants future administrations at [facility] which are scheduled for 08/26/24, 09/25/24, 10/23/24 Monitor for side effects including serum creatinine Will add labs, pharmacy clinic visit in 2 weeks Labs, Dr. Odean visit on 09/08/24 Erica Higgins can follow up with clinical pharmacy as deemed necessary by Dr. Mackey Odean going forward   Erica Higgins participated in the discussion, expressed understanding, and voiced agreement with the above plan. All questions were answered to her satisfaction. The patient was advised to contact the clinic at [phone] with any questions or concerns prior to her return visit.   I spent 30 minutes assessing and educating the patient.  Kerron Sedano A. Lucila,  PharmD, BCOP, CPP  Norleen DELENA Lucila, RPH-CPP, 07/29/2024  4:08 PM   **Disclaimer: This note was dictated with voice recognition software. Similar sounding words can inadvertently be transcribed and this note may contain transcription errors which may not have been corrected upon publication of note.**

## 2024-07-29 ENCOUNTER — Inpatient Hospital Stay

## 2024-07-29 ENCOUNTER — Inpatient Hospital Stay: Admitting: Pharmacist

## 2024-07-29 VITALS — BP 128/62 | HR 71 | Temp 98.0°F | Resp 18 | Wt 261.8 lb

## 2024-07-29 VITALS — BP 128/62 | HR 71 | Resp 18

## 2024-07-29 DIAGNOSIS — Z170 Estrogen receptor positive status [ER+]: Secondary | ICD-10-CM

## 2024-07-29 DIAGNOSIS — Z5111 Encounter for antineoplastic chemotherapy: Secondary | ICD-10-CM | POA: Diagnosis not present

## 2024-07-29 LAB — CBC WITH DIFFERENTIAL (CANCER CENTER ONLY)
Abs Immature Granulocytes: 0.01 K/uL (ref 0.00–0.07)
Basophils Absolute: 0.1 K/uL (ref 0.0–0.1)
Basophils Relative: 1 %
Eosinophils Absolute: 0.1 K/uL (ref 0.0–0.5)
Eosinophils Relative: 2 %
HCT: 37.9 % (ref 36.0–46.0)
Hemoglobin: 12.6 g/dL (ref 12.0–15.0)
Immature Granulocytes: 0 %
Lymphocytes Relative: 36 %
Lymphs Abs: 2.2 K/uL (ref 0.7–4.0)
MCH: 30.1 pg (ref 26.0–34.0)
MCHC: 33.2 g/dL (ref 30.0–36.0)
MCV: 90.5 fL (ref 80.0–100.0)
Monocytes Absolute: 0.4 K/uL (ref 0.1–1.0)
Monocytes Relative: 7 %
Neutro Abs: 3.4 K/uL (ref 1.7–7.7)
Neutrophils Relative %: 54 %
Platelet Count: 253 K/uL (ref 150–400)
RBC: 4.19 MIL/uL (ref 3.87–5.11)
RDW: 13.3 % (ref 11.5–15.5)
WBC Count: 6.2 K/uL (ref 4.0–10.5)
nRBC: 0 % (ref 0.0–0.2)

## 2024-07-29 LAB — CMP (CANCER CENTER ONLY)
ALT: 23 U/L (ref 0–44)
AST: 30 U/L (ref 15–41)
Albumin: 3.9 g/dL (ref 3.5–5.0)
Alkaline Phosphatase: 75 U/L (ref 38–126)
Anion gap: 10 (ref 5–15)
BUN: 10 mg/dL (ref 6–20)
CO2: 25 mmol/L (ref 22–32)
Calcium: 9.2 mg/dL (ref 8.9–10.3)
Chloride: 107 mmol/L (ref 98–111)
Creatinine: 1.01 mg/dL — ABNORMAL HIGH (ref 0.44–1.00)
GFR, Estimated: >60 mL/min (ref >60)
Glucose, Bld: 154 mg/dL — ABNORMAL HIGH (ref 70–99)
Potassium: 3.7 mmol/L (ref 3.5–5.1)
Sodium: 142 mmol/L (ref 135–145)
Total Bilirubin: 0.2 mg/dL (ref 0.0–1.2)
Total Protein: 7 g/dL (ref 6.5–8.1)

## 2024-07-29 MED ORDER — GOSERELIN ACETATE 3.6 MG ~~LOC~~ IMPL
3.6000 mg | DRUG_IMPLANT | Freq: Once | SUBCUTANEOUS | Status: AC
  Administered 2024-07-29: 3.6 mg via SUBCUTANEOUS
  Filled 2024-07-29: qty 3.6

## 2024-07-29 NOTE — Progress Notes (Signed)
 Radiation Oncology Initial Visit Note  Encounter Date: 07/29/2024 Patient Name: Erica Higgins Date of Birth: 08/03/1971 FMW:[id number] Patient Age: 53 y.o.  Referring Physician:   Dr. Alm Blumenthal, MD  Primary Care Provider:  Vida Mardy DEL, [facility]  Diagnoses: Breast Cancer Cancer Staging <redacted file path>  Malignant neoplasm of lower-outer quadrant of right breast of female, estrogen receptor positive    (CMS-HCC) Staging form: Breast, AJCC 8th Edition - Clinical: Stage Unknown (cT1c, cNX, cM0, G2, ER: Positive, PR: Positive, HER2: Negative) - Signed by Vallerie Verneita Passy, MD on 11/22/2017 - Pathologic: pT1b, pN1, cM0, ER+, PR+, HER2- - Signed by Vallerie Verneita Passy, MD on 12/30/2017  Now Metastatic   Assessment and Plan: Ms. 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   We reviewed the details of her initial presentation and treatment as well as recent diagnostic evaluation leading to presumed diagnosis of metastatic disease of the spine.  Admittedly she has not undergone biopsy of the concerning lesion, but this was reviewed with interventionalists who believes that biopsy was associated with undue risk of spinal instability and therefore biopsy will be omitted at this time.  She has known diagnosis of node positive breast cancer that was previously treated and in light of positive bone scan, positive PET scan, concern for metastasis on MRI with evolution from other recent imaging, as well as some elevated markers, I agree with the sentiment that this is a solitary metastasis likely from her prior breast disease.  In light of solitary  metastasis of C2 spine, she has started hormone therapy and will receive Lupron later today, paired with abemaciclib .  We reviewed the role of radiation therapy in the management of metastatic breast cancer.  I discussed in detail the possible role of SBRT to limited sites of metastatic disease in terms of local control and a theoretical benefit in overall disease progression, although admittedly SBRT to all sites of oligometastatic breast cancer has not consistently demonstrated benefit in terms of progression free survival in the literature.  More directly related to her case, there is evidence that treatment with SBRT is optimal in the management of symptomatic metastases as well as preventing skeletal related events for high risk metastases.  She fortunately has minimal symptoms at this time but the lesion is large within the C2 vertebra and I worry that any progression could be very symptomatic given that this is a high risk site of the spine and I would hate for this to progress to significant instability or spinal cord compression.  So after consideration of all of these factors, I do recommend SBRT to the C2 vertebra.  The patient agrees with this rationale.  We discussed the possible Higgins effects and risks of treatment which include but are not limited to fatigue, odynophagia, pain flare, and pathologic fracture.  Informed consent was obtained and witnessed by the staff here in [place].  The patient lives in [place]  which is at least a 1.5-hour drive from [facility].  She prefers driving to and from [facility] for each treatment as she is the single parent of 40 year old twins.  She also recently started a new job and is  hoping to minimize time off work.  For these reasons and given our clinical experience using [employer] to deliver SBRT to the spine, I am comfortable offering treatment and only 3 fractions.  Will coordinate CT evaluation, simulation, and neurosurgical evaluation to  minimize trips to [facility].  She had initially presented with right facial numbness and although this has improved, we will further evaluate with a maxillofacial CT scan which has been ordered and again imaging will be coordinated with date of CT simulation.  Summary of plan: - Neurosurgery referral placed for [employer] planning - CT maxillofacial without contrast due to contrast allergy.  This will be scheduled same day as simulation - CT simulation to plan SBRT to the C2 vertebral body - Plan for SBRT 900 cGy x 3 = 2700 cGy treated every other day - Please hold abemaciclib  for 4 days prior to and 4 days after radiation therapy    History of Present Illness: I was asked to see the patient by Dr. Dannielle for consideration of radiation therapy for a diagnosis of metastatic breast cancer  The details of her initial breast cancer diagnosis and treatments are well-documented below.  More recently the patient presented with right facial numbness that she believes may have been associated with her longstanding history of chronic migraine.  The numbness prompted imaging evaluation that demonstrated concerning lesion in the C2 vertebra which prompted additional evaluation.  She underwent NM bone scan which was positive in the cervical spine, PET/CT which demonstrated a PET avid lesion in C2, MRI brain which was negative except for the lesion in C2 and a dedicated cervical spine MRI again consistent with a lesion in C2.  She has historical imaging that does not demonstrate this lesion so it has clearly developed over the last year or so.  Additionally tumor markers were mildly elevated and tumor board discussion reached consensus that this was likely a solitary metastasis from her prior breast cancer.  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  She reports longstanding snap crackle and pop in her  neck but no significant worsening of neck pain or challenges with range of motion that have evolved over the last couple years.  She denies arm weakness, changes in sensation, focal neurologic deficit of any kind.  She does endorse the right facial numbness but has not experienced this in recent weeks.  She denies headaches, change in vision, seizure.  She denies any new or worsening sites of pain systemically and denies fever, chills, weight loss.  Hematology/Oncology History Overview Note  2/19: Palpable breast mass  10/09/17: Diagnostic mammogram: 1. Palpable lesion consistent with epidermoid inclusion cyst 2. Incidental spiculated right breast mass in the 8 oclock position of the right breast measuring 1.3 x 1.4 x 1.6 cm  U/S: hypoechoic mass consistent with 1.3 cm lesion seen on mammogram in lower outer right breast. There is shadowing consistent with malignancy. No axillary LAN.  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 Bakos provided informed consent to pursue genetic testing and the blood sample was sent to [facility] for analysis of the Breast Cancer STAT Panel with plans to reflex to the Common Hereditary Cancers Panel. Results should be available within approximately 2-3 weeks' time, at which point they will be  disclosed by telephone to Ms. Dwyer, as will any additional recommendations warranted by these results. Ms. Cashaw will receive a summary of her genetic counseling visit and a copy of her results once available. This information will also be available in Epic.   11/25/2017 Genetic Testing  The Common Hereditary Cancer Panel offered by [facility] includes sequencing and/or deletion duplication testing of the following 47 genes: APC, ATM, AXIN2, BARD1, BMPR1A, BRCA1, BRCA2, BRIP1, CDH1, CDKN2A (p14ARF), CDKN2A (p16INK4a), CKD4, CHEK2, CTNNA1, DICER1,  EPCAM (Deletion/duplication testing only), GREM1 (promoter region deletion/duplication testing only), KIT, MEN1, MLH1, MSH2, MSH3, MSH6, MUTYH, NBN, NF1, NHTL1, PALB2, PDGFRA, PMS2, POLD1, POLE, PTEN, RAD50, RAD51C, RAD51D, SDHB, SDHC, SDHD, SMAD4, SMARCA4. STK11, TP53, TSC1, TSC2, and VHL. The following genes were evaluated for sequence changes only: SDHA and HOXB13 c.251G>A variant only.  Results: Negative, no pathogenic variants identified. The date of this test report is 11/25/2017.    12/05/2017 Surgery  Right mastectomy: Invasive ductal carcinoma 1.1 cm with lymphovascular invasion, 1/5 lymph nodes positive, grade 2, ER 90%, PR 100%, Ki-67 2%, HER-2 negative T1bN1 Stage 1B  Mammaprint: Low risk        Malignant neoplasm of lower-outer quadrant of right breast of female, estrogen receptor positive    (CMS-HCC)  11/22/2017 Initial Diagnosis   Malignant neoplasm of right female breast (CMS-HCC)   11/25/2017 Genetics   Patient has genetic testing done for Breast Cancer. Results revealed patient has the following mutation(s): Negative   12/05/2017 Surgery   Right Breast Mastectomy with Sentinel Lymph Node Biopsy   12/30/2017 -  Cancer Staged   Staging form: Breast, AJCC 8th Edition - Pathologic: pT1b, pN1, cM0, ER+, PR+, HER2- - Signed by Verneita Alvira Devonshire, MD on 12/30/2017    01/29/2018 - 03/14/2018 Radiation   5040 cGy Gy in 28 daily Fractions to the right chest wall, axilla and supraclavicular lymph nodes  Followed by a 800 cGy of a planned 1000 cGy of mastectomy scar boost disconitnued early secondary to dermatitis/cellulitis     04/2018 Endocrine/Hormone Therapy   Gudena- dose reduced to 10 mg daily     Past Medical History[1]  Past Surgical History[2]   I have reviewed old/outside medical records (please see above for summary).  Prior Radiation Therapy:  Yes.   Pacemaker:  No.   Pregnancy status:  Will order pregnancy test prior to simulation Collagen Vascular  Disease:  No. Inflammatory Bowel Disease:  No.   OB History  Gravida Para Term Preterm AB Living  1 1 1  0 0 2  SAB IAB Ectopic Molar Multiple Live Births  0 0 0 0 1 2    Medications: Current Medications[3]  Allergies: Allergies[4]  Family History: Her family history includes Arthritis in her mother; Breast cancer in her maternal aunt and paternal aunt; Cancer in her maternal aunt and maternal grandmother; Emphysema in her paternal grandmother; Heart attack in her paternal grandfather and paternal uncle; Heart disease in her maternal grandmother and paternal uncle; Hyperlipidemia in her father and mother; Hypertension in her father, maternal grandmother, and mother; Osteoporosis in her mother; Pancreatic cancer in her maternal grandmother; Stroke in her maternal grandmother. She She indicated that her mother is alive. She indicated that her father is alive. She indicated that the status of her maternal grandmother is unknown. She indicated that the status of her paternal grandmother is unknown. She indicated that the status of her paternal grandfather is unknown. She indicated that her daughter is alive. She indicated that her son is  alive. She indicated that the status of her maternal aunt is unknown. She indicated that her paternal aunt is alive. She indicated that the status of her paternal uncle is unknown and reported the following: all 3 uncles on dad's Higgins have had MI.  family history includes Arthritis in her mother; Breast cancer in her maternal aunt and paternal aunt; Cancer in her maternal aunt and maternal grandmother; Emphysema in her paternal grandmother; Heart attack in her paternal grandfather and paternal uncle; Heart disease in her maternal grandmother and paternal uncle; Hyperlipidemia in her father and mother; Hypertension in her father, maternal grandmother, and mother; Osteoporosis in her mother; Pancreatic cancer in her maternal grandmother; Stroke in her maternal  grandmother.  Social History: Single parent of 73 yo twins who are seniors in high school  Former EMT in the [employer]  Working intake at the hospital   Review of Systems: A comprehensive review of 10 systems was negative except for pertinent positives noted in HPI.  Physical Exam:  Karnofsky Performance Status: 100, Fully active, able to carry on all pre-disease performed without restriction (ECOG equivalent 0) General:  No acute distress, alert and oriented X 4 Neuro:  Normal Gait and Cognition. CN II-XII focally intact, strength 5/5 and symmetric in biceps, triceps, hand grip, and grossly in legs. Sensation intact in distal extremities.  HEENT: Moist mucous membranes Neck: Supple, midline Cardio: Well perfused, warm Lungs: Normal work of breathing MSK: No tenderness to palpation along C-spine Psych: Normal mood and affect. Converses clearly and emotionally appropriate.  Imaging, Labs, and Pathology: We have personally reviewed the imaging studies as detailed above.   Omega Breen, MD, [facility] Assistant Professor Radiation Oncology        [1] Past Medical History: Diagnosis Date  . Abnormal EKG   . Arthritis Knee, hands   bilat knees, hips, fingers  . Asthma (HHS-HCC)    environmental enduced asthma  . Baker's cyst 04/2022   Left knee  . Breast cancer    (CMS-HCC) 2019   right breast Invasive Ductal Carcinoma  . COVID-19 05/2022  . Depression    history of  . Difficult intravenous access    left AC is the best place to get an IV started  . Enlarged LA (left atrium)   . Former smoker    quit 1993  . Hepatic steatosis   . History of transfusion 2008   during c-section  . Lung nodule   . Migraines   . Morbid obesity with BMI of 45.0-49.9, adult (CMS-HCC)   . OSA (obstructive sleep apnea)    suppose to use CPAP but does not use it  . PCOS (polycystic ovarian syndrome)   . Personal history of radiation therapy    right chest wall/supraclavicular region for  breast cancer  . Pneumonia 09/2018  . Prediabetes   . Use of tamoxifen  (Nolvadex )   [2] Past Surgical History: Procedure Laterality Date  . BREAST BIOPSY Right   . BREAST BIOPSY Left 12/07/2020   FIBROCYSTIC CHANGES WITH CALCIFICATIONS, COLUMNAR CELL HYPERPLASIA- NO EVIDENCE OF MALIGNANCY of the LEFT breast 1.2 cm group of calcifications, upper inner quadrant.  SABRA BREAST SURGERY Right 12/05/2017   right mastectomy with axilla sentinel node biopsy  . CESAREAN SECTION  2008  . CHEMOTHERAPY     tamoxifen   . COLONOSCOPY  08/31/2020  . ENDOMETRIAL ABLATION  2011  . KNEE ARTHROSCOPY W/ MENISCAL REPAIR Left    2003/2005  . KNEE SURGERY  01/2002,01/2004   Left mencus  .  MASTECTOMY    . PR BIOPSY OF UTERUS LINING N/A 07/19/2023   Procedure: ENDOMETRIAL SAMPLING (BIOPSY) W/WO ENDOCERVICAL SAMPLING (BIOPSY), WITHOUT CERVICAL DILATION, ANY METHOD;  Surgeon: May Lavaun Deiters, MD;  Location: OR [facility];  Service: Gynecology  . PR KNEE SCOPE,MED/LAT MENISECTOMY Left 06/12/2022   Procedure: ARTHROSCOPY, KNEE; W/MENISECT(MED/LAT, INCL MENISCAL SHAVE) W/DEBRIDE/SHAVE ARTICULAR CART(CHONDROPLASTY);  Surgeon: Heyward Manus Dawn, DO;  Location: OR [facility];  Service: Orthopedics  . radiation treatment Right   . TONSILLECTOMY  1996  [3] Current Outpatient Medications  Medication Sig Dispense Refill  . abemaciclib  (VERZENIO ) 100 mg Tab tablet Take 1 tablet (100 mg total) by mouth. (Patient not taking: Reported on 06/26/2024)    . AIRSUPRA  90-80 mcg/actuation HFAA Inhale 2 puffs every six (6) hours as needed (sob).    . baclofen (LIORESAL) 20 MG tablet Take 1 tablet (20 mg total) by mouth Three (3) times a day as needed (muscle spasms).    . cholecalciferol, vitamin D3-125 mcg, 5,000 unit,, 125 mcg (5,000 unit) tablet Take 1 tablet (125 mcg total) by mouth every morning.    . fluticasone propionate (FLONASE) 50 mcg/actuation nasal spray TAKE 2 APPLICATOR (INTRANASAL) DAILY 2 SPRAYS EACH NOSTRIL DAILY     . metFORMIN (GLUCOPHAGE-XR) 500 MG 24 hr tablet Take 1 tablet (500 mg total) by mouth.    . multivitamin (TAB-A-VITE/THERAGRAN) per tablet Take 1 tablet by mouth daily.    SABRA oxybutynin (DITROPAN) 5 MG tablet Take 1 tablet (5 mg total) by mouth every morning.    . tamoxifen  (NOLVADEX ) 10 MG tablet Take 1 tablet (10 mg total) by mouth every morning.    . tamoxifen  (NOLVADEX ) 20 MG tablet Take 1 tablet (20 mg total) by mouth daily.    . topiramate  (TOPAMAX ) 50 MG tablet 1 tablet (50 mg total) nightly.    . turmeric root extract 500 mg cap Take 1,000 mg by mouth in the morning. (Patient not taking: Reported on 06/26/2024)    . UBRELVY  100 mg tablet      No current facility-administered medications for this visit.  [4] Allergies Allergen Reactions  . Contrast  [Iodinated Contrast Media] Hives and Shortness Of Breath  . Latex Hives  . Metrizamide Anaphylaxis  . Penicillins Hives  . Shellfish Containing Products Anaphylaxis  . Adhesive Tape-Silicones Itching and Rash

## 2024-08-10 ENCOUNTER — Other Ambulatory Visit: Payer: Self-pay | Admitting: Hematology and Oncology

## 2024-08-10 NOTE — Progress Notes (Unsigned)
 [facility]        Telephone: [phone]?Fax: [phone]   Oncology Clinical Pharmacist Practitioner Progress Note   Erica Higgins was contacted via in-person to discuss her chemotherapy regimen for abemaciclib  which they receive under the care of Dr. Vinay Gudena.  Current treatment regimen and start date Abemaciclib  (07/03/24) Anastrozole  (07/21/24) Goserelin (07/29/24)  Interval History She continues on abemaciclib  100 mg by mouth every 12 hours on days 1 to 28 of a 28-day cycle. This is being given in combination with anastrozole  and goserelin. Therapy is planned to continue until disease progression or unacceptable toxicity. She last saw Dr. Gudena on 07/21/24 and clinical pharmacy on 07/29/24.  Response to Therapy She is doing well. We discussed that if her labs continue to look good and she feels well, she should consider  increasing the abemaciclib  dose to 150 mg every 12 hours at her next visit with Dr. Gudena on 09/08/24.  She has her [facility] radiation tx scheduled for 08/18/24 and per their instructions she will hold abemaciclib  four days prior and four days post procedure.  She feels her overactive bladder may be worse now since starting goserelin. We reviewed that it does not appear there is anything in the FDA labeling but that her PCP could consider going up on her dose of oxybutynin to BID or TID since currently she only takes it daily. She will inquire.  Erica Higgins continues to receive goserelin at [facility] (her preference) and she will see Dr. Odean with labs on 09/08/24 and clinical pharmacy in approximately 8 weeks.  Labs, vitals, treatment parameters, and manufacturer guidelines assessing toxicity were reviewed with Erica Higgins today. Based on these values, patient is in agreement to continue abemaciclib  therapy at this time.  Allergies Allergies  Allergen Reactions   Cleocin  [Clindamycin  Hcl] Hives   Ivp Dye [Iodinated Contrast Media] Anaphylaxis   Latex  Hives   Penicillins Hives   Shellfish Allergy Anaphylaxis   Adhesive [Tape]     Rash, itch     Vitals    08/11/2024    8:49 AM 07/29/2024    3:58 PM 07/29/2024    3:16 PM  Oncology Vitals  Weight 119.115 kg 118.752 kg   Weight (lbs) 262 lbs 10 oz 261 lbs 13 oz   BMI 46.52 kg/m2 46.38 kg/m2   Temp 97.4 F (36.3 C) 98 F (36.7 C)   Pulse Rate 58 71 71  BP 143/68 128/62 128/62  Resp 16 18 18   SpO2 100 % 100 % 100 %  BSA (m2) 2.3 m2 2.3 m2     Laboratory Data    Latest Ref Rng & Units 08/11/2024    7:47 AM 07/29/2024    2:54 PM 07/16/2024    3:22 PM  CBC EXTENDED  WBC 4.0 - 10.5 K/uL 6.1  6.2  6.9   RBC 3.87 - 5.11 MIL/uL 4.25  4.19  4.41   Hemoglobin 12.0 - 15.0 g/dL 87.1  87.3  86.8   HCT 36.0 - 46.0 % 39.2  37.9  39.7   Platelets 150 - 400 K/uL 270  253  259   NEUT# 1.7 - 7.7 K/uL 3.7  3.4  3.7   Lymph# 0.7 - 4.0 K/uL 1.8  2.2  2.6        Latest Ref Rng & Units 08/11/2024    7:47 AM 07/29/2024    2:54 PM 07/16/2024    3:22 PM  CMP  Glucose 70 - 99 mg/dL 815  154  111   BUN 6 - 20 mg/dL 12  10  13    Creatinine 0.44 - 1.00 mg/dL 8.89  8.98  8.83   Sodium 135 - 145 mmol/L 141  142  140   Potassium 3.5 - 5.1 mmol/L 4.2  3.7  3.9   Chloride 98 - 111 mmol/L 107  107  105   CO2 22 - 32 mmol/L 24  25  28    Calcium 8.9 - 10.3 mg/dL 9.1  9.2  9.3   Total Protein 6.5 - 8.1 g/dL 7.0  7.0  7.1   Total Bilirubin 0.0 - 1.2 mg/dL 0.3  0.2  0.3   Alkaline Phos 38 - 126 U/L 68  75  77   AST 15 - 41 U/L 39  30  20   ALT 0 - 44 U/L 34  23  22      Adverse Effects Assessment Serum creatinine: slight increase to 1.10 mg/dL, up from 8.98. Encouraged oral fluid intake  Adherence Assessment Shajuan Higgins reports missing 0 doses over the past 2 weeks.   Reason for missed dose: N/A Patient was re-educated on importance of adherence.   Access Assessment Essence Schreurs is currently receiving her abemaciclib  through [facility]  Insurance concerns:  none  Medication  Reconciliation The patient's medication list was reviewed today with the patient? Yes New medications or herbal supplements have recently been started? No  Any medications have been discontinued? No  The medication list was updated and reconciled based on the patient's most recent medication list in the electronic medical record (EMR) including herbal products and OTC medications.   Medications Current Outpatient Medications  Medication Sig Dispense Refill   Albuterol-Budesonide (AIRSUPRA ) 90-80 MCG/ACT AERO Inhale 2 puffs into the lungs every 6 (six) hours as needed. 10.7 g 5   anastrozole  (ARIMIDEX ) 1 MG tablet Take 1 tablet (1 mg total) by mouth daily. 90 tablet 3   Aspirin 81 MG CAPS Take 1 tablet by mouth daily.     B Complex Vitamins (B COMPLEX PO) Take by mouth.     chlorpheniramine (CHLOR-TRIMETON) 4 MG tablet Take 4 mg by mouth 2 (two) times daily as needed for allergies.     DHA-EPA-Vitamin E (OMEGA-3 COMPLEX PO) Take 1 capsule by mouth in the morning and at bedtime.     fexofenadine (ALLEGRA) 180 MG tablet Take 180 mg by mouth daily.     fluticasone (FLONASE) 50 MCG/ACT nasal spray      Magnesium  250 MG TABS Take 1 tablet by mouth daily.     melatonin 5 MG TABS Take 5 mg by mouth at bedtime.     oxybutynin (DITROPAN) 5 MG tablet Take by mouth.     topiramate  (TOPAMAX ) 50 MG tablet Take 1 tablet (50 mg total) by mouth at bedtime. 30 tablet 5   Ubrogepant  (UBRELVY ) 100 MG TABS Take 1 tablet (100 mg total) by mouth as needed. May repeat after 2 hours.  Maximum 2 tablets in 24 hours. 16 tablet 5   VERZENIO  100 MG tablet TAKE 1 TABLET (100 MG) TWICE DAILY. SWALLOW TABLETS WHOLE, DO NOT CHEW, CRUSH, OR SPLIT TABLETS BEFORE SWALLOWING 168 tablet 0   No current facility-administered medications for this visit.   Drug-Drug Interactions (DDIs) DDIs were evaluated? Yes Significant DDIs? No  The patient was instructed to speak with their health care provider and/or the oral chemotherapy  pharmacist before starting any new drug, including prescription or over the counter, natural /  herbal products, or vitamins.  Supportive Care Diarrhea: we reviewed that diarrhea is common with abemaciclib  and confirmed that she does have loperamide (Imodium) at home.  We reviewed how to take this medication PRN. ILD/Pneumonitis: we reviewed potential symptoms including cough, shortness, and fatigue.  VTE: reviewed signs of DVT such as leg swelling, redness, pain, or tenderness and signs of PE such as shortness of breath, rapid or irregular heartbeat, cough, chest pain, or lightheadedness. Drug interactions with grapefruit products  Dosing Assessment Hepatic adjustments needed? No  Renal adjustments needed? No  Toxicity adjustments needed? No  The current dosing regimen is appropriate to continue at this time.  Follow-Up Plan Continue abemaciclib  100 by mouth every 12 hours.  Continue anastrozole  1 mg by mouth daily Continue goserelin 3.6 mg SubQ every 28 days. Started on 07/29/24, next scheduled at [facility] for 08/26/24, 09/25/24, 10/23/24 Monitor for side effects including serum creatinine She will see Dr. Odean with labs prior on 09/09/23. Should consider increasing dose of abemaciclib  at that time if labs continue to look good and side effects are manageable. She will hold abemaciclib  4 days pre/post her radiation tx at [facility] per their instruction Ms. Sulton will speak to her PCP about possibly going up on oxybutynin dose due to overactive bladder symptoms. Has been taking it daily for years and literature supports going up to three times a day dosing for the immediate release formulation Will add labs, pharmacy clinic visit in 8 weeks Labs, Dr. Odean visit on 09/08/24 Renezmae Bilello can follow up with clinical pharmacy as deemed necessary by Dr. Mackey Odean going forward   Kerensa Hollman participated in the discussion, expressed understanding, and voiced agreement with the above plan. All questions were  answered to her satisfaction. The patient was advised to contact the clinic at [phone] with any questions or concerns prior to her return visit.   I spent 30 minutes assessing and educating the patient.  Kharee Lesesne A. Lucila, PharmD, BCOP, CPP  Norleen DELENA Lucila, RPH-CPP, 08/11/2024  9:14 AM   **Disclaimer: This note was dictated with voice recognition software. Similar sounding words can inadvertently be transcribed and this note may contain transcription errors which may not have been corrected upon publication of note.**

## 2024-08-11 ENCOUNTER — Inpatient Hospital Stay: Admitting: Pharmacist

## 2024-08-11 ENCOUNTER — Inpatient Hospital Stay: Attending: Hematology and Oncology

## 2024-08-11 VITALS — BP 143/68 | HR 58 | Temp 97.4°F | Resp 16 | Wt 262.6 lb

## 2024-08-11 DIAGNOSIS — Z5111 Encounter for antineoplastic chemotherapy: Secondary | ICD-10-CM | POA: Diagnosis present

## 2024-08-11 DIAGNOSIS — C50511 Malignant neoplasm of lower-outer quadrant of right female breast: Secondary | ICD-10-CM

## 2024-08-11 LAB — CBC WITH DIFFERENTIAL (CANCER CENTER ONLY)
Abs Immature Granulocytes: 0.03 K/uL (ref 0.00–0.07)
Basophils Absolute: 0.1 K/uL (ref 0.0–0.1)
Basophils Relative: 1 %
Eosinophils Absolute: 0.1 K/uL (ref 0.0–0.5)
Eosinophils Relative: 2 %
HCT: 39.2 % (ref 36.0–46.0)
Hemoglobin: 12.8 g/dL (ref 12.0–15.0)
Immature Granulocytes: 1 %
Lymphocytes Relative: 30 %
Lymphs Abs: 1.8 K/uL (ref 0.7–4.0)
MCH: 30.1 pg (ref 26.0–34.0)
MCHC: 32.7 g/dL (ref 30.0–36.0)
MCV: 92.2 fL (ref 80.0–100.0)
Monocytes Absolute: 0.4 K/uL (ref 0.1–1.0)
Monocytes Relative: 6 %
Neutro Abs: 3.7 K/uL (ref 1.7–7.7)
Neutrophils Relative %: 60 %
Platelet Count: 270 K/uL (ref 150–400)
RBC: 4.25 MIL/uL (ref 3.87–5.11)
RDW: 13.7 % (ref 11.5–15.5)
WBC Count: 6.1 K/uL (ref 4.0–10.5)
nRBC: 0 % (ref 0.0–0.2)

## 2024-08-11 LAB — CMP (CANCER CENTER ONLY)
ALT: 34 U/L (ref 0–44)
AST: 39 U/L (ref 15–41)
Albumin: 3.9 g/dL (ref 3.5–5.0)
Alkaline Phosphatase: 68 U/L (ref 38–126)
Anion gap: 11 (ref 5–15)
BUN: 12 mg/dL (ref 6–20)
CO2: 24 mmol/L (ref 22–32)
Calcium: 9.1 mg/dL (ref 8.9–10.3)
Chloride: 107 mmol/L (ref 98–111)
Creatinine: 1.1 mg/dL — ABNORMAL HIGH (ref 0.44–1.00)
GFR, Estimated: >60 mL/min (ref >60)
Glucose, Bld: 184 mg/dL — ABNORMAL HIGH (ref 70–99)
Potassium: 4.2 mmol/L (ref 3.5–5.1)
Sodium: 141 mmol/L (ref 135–145)
Total Bilirubin: 0.3 mg/dL (ref 0.0–1.2)
Total Protein: 7 g/dL (ref 6.5–8.1)

## 2024-08-26 ENCOUNTER — Inpatient Hospital Stay

## 2024-08-26 VITALS — BP 114/63 | HR 76 | Temp 96.7°F | Resp 18

## 2024-08-26 DIAGNOSIS — C50511 Malignant neoplasm of lower-outer quadrant of right female breast: Secondary | ICD-10-CM

## 2024-08-26 DIAGNOSIS — Z5111 Encounter for antineoplastic chemotherapy: Secondary | ICD-10-CM | POA: Diagnosis not present

## 2024-08-26 MED ORDER — GOSERELIN ACETATE 3.6 MG ~~LOC~~ IMPL
3.6000 mg | DRUG_IMPLANT | Freq: Once | SUBCUTANEOUS | Status: AC
  Administered 2024-08-26: 3.6 mg via SUBCUTANEOUS
  Filled 2024-08-26: qty 3.6

## 2024-08-26 NOTE — Progress Notes (Signed)
 Erica Higgins presents today for Zoladex  injection per the provider's orders.  Stable during administration without incident; injection site WNL; see MAR for injection details.  Patient tolerated procedure well and without incident.  No questions or complaints noted at this time.

## 2024-08-26 NOTE — Patient Instructions (Signed)
 [facility] - A DEPT OF [facility]  Discharge Instructions: Thank you for choosing [facility] to provide your oncology and hematology care.  If you have a lab appointment with the Cancer Center - please note that after Juliauna 8th, 2024, all labs will be drawn in the cancer center.  You do not have to check in or register with the main entrance as you have in the past but will complete your check-in in the cancer center.  Wear comfortable clothing and clothing appropriate for easy access to any Portacath or PICC line.   We strive to give you quality time with your provider. You may need to reschedule your appointment if you arrive late (15 or more minutes).  Arriving late affects you and other patients whose appointments are after yours.  Also, if you miss three or more appointments without notifying the office, you may be dismissed from the clinic at the providers discretion.      For prescription refill requests, have your pharmacy contact our office and allow 72 hours for refills to be completed.    Today you received the following chemotherapy and/or immunotherapy agents Zoladex       To help prevent nausea and vomiting after your treatment, we encourage you to take your nausea medication as directed.  BELOW ARE SYMPTOMS THAT SHOULD BE REPORTED IMMEDIATELY: *FEVER GREATER THAN 100.4 F (38 C) OR HIGHER *CHILLS OR SWEATING *NAUSEA AND VOMITING THAT IS NOT CONTROLLED WITH YOUR NAUSEA MEDICATION *UNUSUAL SHORTNESS OF BREATH *UNUSUAL BRUISING OR BLEEDING *URINARY PROBLEMS (pain or burning when urinating, or frequent urination) *BOWEL PROBLEMS (unusual diarrhea, constipation, pain near the anus) TENDERNESS IN MOUTH AND THROAT WITH OR WITHOUT PRESENCE OF ULCERS (sore throat, sores in mouth, or a toothache) UNUSUAL RASH, SWELLING OR PAIN  UNUSUAL VAGINAL DISCHARGE OR ITCHING   Items with * indicate a potential emergency and should be followed up  as soon as possible or go to the Emergency Department if any problems should occur.  Please show the CHEMOTHERAPY ALERT CARD or IMMUNOTHERAPY ALERT CARD at check-in to the Emergency Department and triage nurse.  Should you have questions after your visit or need to cancel or reschedule your appointment, please contact [facility] [place] - A DEPT OF [facility] [phone]  and follow the prompts.  Office hours are 8:00 a.m. to 4:30 p.m. Monday - Friday. Please note that voicemails left after 4:00 p.m. may not be returned until the following business day.  We are closed weekends and major holidays. You have access to a nurse at all times for urgent questions. Please call the main number to the clinic [phone] and follow the prompts.  For any non-urgent questions, you may also contact your provider using MyChart. We now offer e-Visits for anyone 79 and older to request care online for non-urgent symptoms. For details visit [email or web address].   Also download the MyChart app! Go to the app store, search MyChart, open the app, select [facility], and log in with your MyChart username and password.

## 2024-08-31 ENCOUNTER — Encounter: Payer: Self-pay | Admitting: *Deleted

## 2024-08-31 ENCOUNTER — Inpatient Hospital Stay: Admitting: Pharmacist

## 2024-08-31 ENCOUNTER — Inpatient Hospital Stay

## 2024-09-08 ENCOUNTER — Inpatient Hospital Stay

## 2024-09-08 ENCOUNTER — Inpatient Hospital Stay: Admitting: Hematology and Oncology

## 2024-09-08 NOTE — Assessment & Plan Note (Signed)
 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  Joint pains and stiffness: Much better on 10 mg dose 2. 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  Breast exam 11/18/2023: Benign 2.  Mammogram left breast 12/04/2021: Benign breast density category C 1 patient needs a new mammogram CT Chest 01/27/2023: Bilateral pulmonary nodules 4 mm, felt to be low risk 3.  CT CAP 06/12/2024: Stable 4 mm nodule left lung apex and 3 mm nodule superior segment left lower lobe 4.  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  MRI cervical spine: 06/09/2024: Solitary suspicious C2 vertebral body lesion 6. PET-CT 06/22/24: Hypermetabolic C2 vertebral body corresponding to a suspicious lesion.  No additional skeletal activity or metastases.   Treatment plan: Discussed with interventional radiology: C2 lesion cannot be biopsied. Radiation oncology and [place] to evaluate for palliative radiation Menopause labs: Estradiol  19.8, FSH 38.9 (patient is perimenopausal) therefore we do not think she would be ideal candidate to switch to aromatase inhibitors yet. Guardant360 06/23/2024: No mutations, MSI high not detected, tumor fraction less than 0.05% Current treatment: Tamoxifen  with abemaciclib  started 07/02/2024 (her insurance is not approving abemaciclib  in combination with tamoxifen )   Abemaciclib  toxicities: Tolerating it extremely well without any problems or concerns. Mild intermittent diarrhea: Not bothering her.   Recommendation: Ovarian function suppression with Zoladex   injections monthly with anastrozole  along with abemaciclib . Patient would like to receive these injections at [facility].  I will request this to be set up at [facility]

## 2024-09-09 ENCOUNTER — Inpatient Hospital Stay: Attending: Hematology and Oncology

## 2024-09-09 ENCOUNTER — Encounter: Payer: Self-pay | Admitting: Hematology and Oncology

## 2024-09-09 ENCOUNTER — Inpatient Hospital Stay (HOSPITAL_BASED_OUTPATIENT_CLINIC_OR_DEPARTMENT_OTHER): Admitting: Hematology and Oncology

## 2024-09-09 VITALS — BP 135/70 | HR 68 | Temp 97.7°F | Resp 17 | Wt 261.1 lb

## 2024-09-09 DIAGNOSIS — C50511 Malignant neoplasm of lower-outer quadrant of right female breast: Secondary | ICD-10-CM | POA: Insufficient documentation

## 2024-09-09 DIAGNOSIS — Z170 Estrogen receptor positive status [ER+]: Secondary | ICD-10-CM | POA: Insufficient documentation

## 2024-09-09 DIAGNOSIS — R7401 Elevation of levels of liver transaminase levels: Secondary | ICD-10-CM | POA: Insufficient documentation

## 2024-09-09 DIAGNOSIS — R748 Abnormal levels of other serum enzymes: Secondary | ICD-10-CM | POA: Diagnosis not present

## 2024-09-09 DIAGNOSIS — M2550 Pain in unspecified joint: Secondary | ICD-10-CM | POA: Diagnosis not present

## 2024-09-09 DIAGNOSIS — R911 Solitary pulmonary nodule: Secondary | ICD-10-CM | POA: Diagnosis not present

## 2024-09-09 DIAGNOSIS — N951 Menopausal and female climacteric states: Secondary | ICD-10-CM | POA: Diagnosis not present

## 2024-09-09 DIAGNOSIS — Z5111 Encounter for antineoplastic chemotherapy: Secondary | ICD-10-CM | POA: Insufficient documentation

## 2024-09-09 DIAGNOSIS — C7951 Secondary malignant neoplasm of bone: Secondary | ICD-10-CM | POA: Diagnosis not present

## 2024-09-09 DIAGNOSIS — Z79810 Long term (current) use of selective estrogen receptor modulators (SERMs): Secondary | ICD-10-CM | POA: Insufficient documentation

## 2024-09-09 DIAGNOSIS — R197 Diarrhea, unspecified: Secondary | ICD-10-CM | POA: Diagnosis not present

## 2024-09-09 LAB — CBC WITH DIFFERENTIAL (CANCER CENTER ONLY)
Abs Immature Granulocytes: 0.03 K/uL (ref 0.00–0.07)
Basophils Absolute: 0.1 K/uL (ref 0.0–0.1)
Basophils Relative: 1 %
Eosinophils Absolute: 0.1 K/uL (ref 0.0–0.5)
Eosinophils Relative: 2 %
HCT: 39 % (ref 36.0–46.0)
Hemoglobin: 13.6 g/dL (ref 12.0–15.0)
Immature Granulocytes: 0 %
Lymphocytes Relative: 28 %
Lymphs Abs: 2.1 K/uL (ref 0.7–4.0)
MCH: 31.3 pg (ref 26.0–34.0)
MCHC: 34.9 g/dL (ref 30.0–36.0)
MCV: 89.7 fL (ref 80.0–100.0)
Monocytes Absolute: 0.5 K/uL (ref 0.1–1.0)
Monocytes Relative: 7 %
Neutro Abs: 4.6 K/uL (ref 1.7–7.7)
Neutrophils Relative %: 62 %
Platelet Count: 290 K/uL (ref 150–400)
RBC: 4.35 MIL/uL (ref 3.87–5.11)
RDW: 13.6 % (ref 11.5–15.5)
WBC Count: 7.5 K/uL (ref 4.0–10.5)
nRBC: 0 % (ref 0.0–0.2)

## 2024-09-09 LAB — CMP (CANCER CENTER ONLY)
ALT: 67 U/L — ABNORMAL HIGH (ref 0–44)
AST: 56 U/L — ABNORMAL HIGH (ref 15–41)
Albumin: 4.1 g/dL (ref 3.5–5.0)
Alkaline Phosphatase: 72 U/L (ref 38–126)
Anion gap: 9 (ref 5–15)
BUN: 12 mg/dL (ref 6–20)
CO2: 26 mmol/L (ref 22–32)
Calcium: 9.1 mg/dL (ref 8.9–10.3)
Chloride: 106 mmol/L (ref 98–111)
Creatinine: 0.98 mg/dL (ref 0.44–1.00)
GFR, Estimated: >60 mL/min
Glucose, Bld: 138 mg/dL — ABNORMAL HIGH (ref 70–99)
Potassium: 4.2 mmol/L (ref 3.5–5.1)
Sodium: 141 mmol/L (ref 135–145)
Total Bilirubin: 0.3 mg/dL (ref 0.0–1.2)
Total Protein: 7.3 g/dL (ref 6.5–8.1)

## 2024-09-09 NOTE — Progress Notes (Signed)
 "  Patient Care Team: Vida Mardy VEAR DEVONNA as PCP - General (Physician Assistant) Merlynn Ozell SAUNDERS, DPM as Attending Physician (Podiatry) Odean Potts, MD as Consulting Physician (Hematology and Oncology) Vanderbilt Ned, MD as Consulting Physician (General Surgery) Vallerie Verneita RAMAN, MD as Referring Physician (Radiation Oncology) Crawford Morna Pickle, NP as Nurse Practitioner (Hematology and Oncology) Maree Isles, MD as Referring Physician (Internal Medicine) Skeet Juliene SAUNDERS, DO as Consulting Physician (Neurology)  DIAGNOSIS:  Encounter Diagnosis  Name Primary?   Malignant neoplasm of lower-outer quadrant of right breast of female, estrogen receptor positive (HCC) Yes    SUMMARY OF ONCOLOGIC HISTORY: Oncology History  Malignant neoplasm of lower-outer quadrant of right breast of female, estrogen receptor positive (HCC)  11/06/2017 Initial Diagnosis   Palpable right breast mass which on radiologic evaluation was epidermoid cyst; incidentally found additional right breast mass at 8 o'clock position: 1.6 cm: IDC grade 2, ER 90%, PR 100%, Ki-67 2%, HER-2 negative, T1CN0 stage I a AJCC 8   11/25/2017 Genetic Testing   The Common Hereditary Cancer Panel offered by [facility] includes sequencing and/or deletion duplication testing of the following 47 genes: APC, ATM, AXIN2, BARD1, BMPR1A, BRCA1, BRCA2, BRIP1, CDH1, CDKN2A (p14ARF), CDKN2A (p16INK4a), CKD4, CHEK2, CTNNA1, DICER1, EPCAM (Deletion/duplication testing only), GREM1 (promoter region deletion/duplication testing only), KIT, MEN1, MLH1, MSH2, MSH3, MSH6, MUTYH, NBN, NF1, NHTL1, PALB2, PDGFRA, PMS2, POLD1, POLE, PTEN, RAD50, RAD51C, RAD51D, SDHB, SDHC, SDHD, SMAD4, SMARCA4. STK11, TP53, TSC1, TSC2, and VHL.  The following genes were evaluated for sequence changes only: SDHA and HOXB13 c.251G>A variant only.  Results: Negative, no pathogenic variants identified.  The date of this test report is 11/25/2017.    12/05/2017 Surgery   Right  mastectomy: Invasive ductal carcinoma 1.1 cm with lymphovascular invasion, 1/5 lymph nodes positive, grade 2, ER 90%, PR 100%, Ki-67 2%, HER-2 negative T1bN1 Stage 1B.  Mammaprint low risk   01/31/2018 - 03/14/2018 Radiation Therapy   Adjuvant XRT at [facility]   04/2018 -  Anti-estrogen oral therapy   Tamoxifen  daily   06/25/2018 Cancer Staging   Staging form: Breast, AJCC 8th Edition - Pathologic: Stage IA (pT1b, pN1, cM0, G2, ER+, PR+, HER2-) - Signed by Crawford Morna Pickle, NP on 06/25/2018   07/03/2024 -  Anti-estrogen oral therapy   Verzenio       CHIEF COMPLIANT:   HISTORY OF PRESENT ILLNESS: Discussed the use of AI scribe software for clinical note transcription with the patient, who gave verbal consent to proceed.  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  She is in the middle of a short course of palliative C2 radiation, with the first fraction completed yesterday and two more planned. She has held abemaciclib  for over a week per prior instruction. Since starting radiation she has had mild unilateral neck pain controlled with acetaminophen . She has otherwise tolerated abemaciclib  since 07/03/2024 with only mild, intermittent, diet-related diarrhea and no neuropathy, chest pain, or persistent dyspnea. Recent labs showed new mild transaminitis compared with prior normal values on therapy. Renal function is normal.  A solitary pulmonary nodule on CT from October 2025 was not visualized on PET. She has no cough or resting dyspnea and only mild exertional dyspnea in cold air or with fast-paced walking, which she attributes to deconditioning. She has remote pneumonia and lung infection with residual scarring but no recent pulmonary issues.  She has longstanding intermittent nocturnal numbness and  paresthesia in the arms and fingers, most prominent in the shoulders, without weakness or object dropping and without clear relation to cancer therapy. She denies cardiac symptoms.  She has an anaphylactic reaction to IVP contrast dye, including with premedication, but has tolerated an alternative CT contrast agent. She remains active at work with frequent walking but does not perform structured exercise outside of work.  Jul 21, 2024: Follow-up for treatment plan discussion; review of recent imaging showing solitary bone metastasis at C2; started abemaciclib  (Verzenio ) on 07/02/2024, tolerating well with mild intermittent diarrhea; continued tamoxifen ; recommendation for ovarian suppression with Zoladex  and anastrozole , but perimenopausal status precludes aromatase inhibitor use; plan for palliative radiation to C2 lesion.     ALLERGIES:  is allergic to cleocin  [clindamycin  hcl], ivp dye [iodinated contrast media], latex, penicillins, shellfish allergy, and adhesive [tape].  MEDICATIONS:  Current Outpatient Medications  Medication Sig Dispense Refill   Albuterol-Budesonide (AIRSUPRA ) 90-80 MCG/ACT AERO Inhale 2 puffs into the lungs every 6 (six) hours as needed. 10.7 g 5   anastrozole  (ARIMIDEX ) 1 MG tablet Take 1 tablet (1 mg total) by mouth daily. 90 tablet 3   Aspirin 81 MG CAPS Take 1 tablet by mouth daily.     B Complex Vitamins (B COMPLEX PO) Take by mouth.     chlorpheniramine (CHLOR-TRIMETON) 4 MG tablet Take 4 mg by mouth 2 (two) times daily as needed for allergies.     DHA-EPA-Vitamin E (OMEGA-3 COMPLEX PO) Take 1 capsule by mouth in the morning and at bedtime.     fexofenadine (ALLEGRA) 180 MG tablet Take 180 mg by mouth daily.     fluticasone (FLONASE) 50 MCG/ACT nasal spray      Magnesium  250 MG TABS Take 1 tablet by mouth daily.     melatonin 5 MG TABS Take 5 mg by mouth at bedtime.     oxybutynin (DITROPAN) 5 MG tablet Take by mouth.     topiramate  (TOPAMAX ) 50 MG tablet Take  1 tablet (50 mg total) by mouth at bedtime. 30 tablet 5   Ubrogepant  (UBRELVY ) 100 MG TABS Take 1 tablet (100 mg total) by mouth as needed. May repeat after 2 hours.  Maximum 2 tablets in 24 hours. 16 tablet 5   VERZENIO  100 MG tablet TAKE 1 TABLET (100 MG) TWICE DAILY. SWALLOW TABLETS WHOLE, DO NOT CHEW, CRUSH, OR SPLIT TABLETS BEFORE SWALLOWING 168 tablet 0   No current facility-administered medications for this visit.    PHYSICAL EXAMINATION: ECOG PERFORMANCE STATUS: 1 - Symptomatic but completely ambulatory  Vitals:   09/09/24 0911  BP: 135/70  Pulse: 68  Resp: 17  Temp: 97.7 F (36.5 C)  SpO2: 98%   Filed Weights   09/09/24 0911  Weight: 261 lb 1.6 oz (118.4 kg)    Physical Exam   (exam performed in the presence of a chaperone)  LABORATORY DATA:  I have reviewed the data as listed    Latest Ref Rng & Units 09/09/2024    8:43 AM 08/11/2024    7:47 AM 07/29/2024    2:54 PM  CMP  Glucose 70 - 99 mg/dL 861  815  845   BUN 6 - 20 mg/dL 12  12  10    Creatinine 0.44 - 1.00 mg/dL 9.01  8.89  8.98   Sodium 135 - 145 mmol/L 141  141  142   Potassium 3.5 - 5.1 mmol/L 4.2  4.2  3.7   Chloride 98 -  111 mmol/L 106  107  107   CO2 22 - 32 mmol/L 26  24  25    Calcium 8.9 - 10.3 mg/dL 9.1  9.1  9.2   Total Protein 6.5 - 8.1 g/dL 7.3  7.0  7.0   Total Bilirubin 0.0 - 1.2 mg/dL 0.3  0.3  0.2   Alkaline Phos 38 - 126 U/L 72  68  75   AST 15 - 41 U/L 56  39  30   ALT 0 - 44 U/L 67  34  23     Lab Results  Component Value Date   WBC 7.5 09/09/2024   HGB 13.6 09/09/2024   HCT 39.0 09/09/2024   MCV 89.7 09/09/2024   PLT 290 09/09/2024   NEUTROABS 4.6 09/09/2024    ASSESSMENT & PLAN:  Malignant neoplasm of lower-outer quadrant of right breast of female, estrogen receptor positive (HCC) 12/05/17: Right mastectomy: Invasive ductal carcinoma 1.1 cm with lymphovascular invasion, 1/5 lymph nodes positive, grade 2, ER 90%, PR 100%, Ki-67 2%, HER-2 negative T1bN1 Stage 1B Mammaprint  12/21/2017: Luminal type A low risk Adjuvant radiation therapy completed   Plan: Tamoxifen  20 mg daily August 2019 dose reduced to 10 mg on June 19, 2019 because of severe joint pains and stiffness   Tamoxifen  toxicities:  1.  Joint pains and stiffness: Much better on 10 mg dose 2. 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  Breast exam 11/18/2023: Benign 2.  Mammogram left breast 12/04/2021: Benign breast density category C 1 patient needs a new mammogram CT Chest 01/27/2023: Bilateral pulmonary nodules 4 mm, felt to be low risk 3.  CT CAP 06/12/2024: Stable 4 mm nodule left lung apex and 3 mm nodule superior segment left lower lobe 4.  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  MRI cervical spine: 06/09/2024: Solitary suspicious C2 vertebral body lesion 6. PET-CT 06/22/24: Hypermetabolic C2 vertebral body corresponding to a suspicious lesion.  No additional skeletal activity or metastases.   Treatment plan: Discussed with interventional radiology: C2 lesion cannot be biopsied. Palliative radiation to her back: 09/08/2024-09/14/2024 Menopause labs: Estradiol  19.8, FSH 38.9 (patient is perimenopausal) therefore we do not think she would be ideal candidate to switch to aromatase inhibitors yet. Guardant360 06/23/2024: No mutations, MSI high not detected, tumor fraction less than 0.05% Current treatment: Zoladex  with letrozole with abemaciclib  started 07/02/2024 (her insurance is not approving abemaciclib  in combination with tamoxifen ): Patient receives [facility]  at [facility]   Abemaciclib  toxicities: Tolerating it extremely well without any problems or concerns. Mild intermittent diarrhea: Not bothering her. Elevation of LFTs: I discussed with her that we would like to hold her dosage at 100 twice daily until her appointment next month.  If the liver enzymes are back to normal then we could talk about increasing the dosage  to 150 twice daily.   Ordered CT scans in March 2026.  To monitor the lung nodule Assessment & Plan Solitary pulmonary nodule Small pulmonary nodule under surveillance due to oncologic history. Mild exertional dyspnea attributed to deconditioning and cold air exposure. - Order CT chest and whole body in six weeks post-radiation to reassess nodule and restage disease. - Confirm use of previously tolerated non-IVP contrast for CT imaging. - Schedule follow-up visit after CT to review results.  Elevated liver enzymes Mild transaminitis noted after holding Verzenio  for radiation. Close monitoring planned. - Repeat liver function tests at next  laboratory visit in February. - Defer Verzenio  dose escalation until liver enzymes confirmed stable.  Contrast dye allergy is only to the IVP medication.  Previously she underwent CT scans with contrast and did not have any reaction.      No orders of the defined types were placed in this encounter.  The patient has a good understanding of the overall plan. she agrees with it. she will call with any problems that may develop before the next visit here.  I personally spent a total of 30 minutes in the care of the patient today including preparing to see the patient, getting/reviewing separately obtained history, performing a medically appropriate exam/evaluation, counseling and educating, placing orders, referring and communicating with other health care professionals, documenting clinical information in the EHR, independently interpreting results, communicating results, and coordinating care.   Viinay K Pati Thinnes, MD 09/09/2024    "

## 2024-09-23 ENCOUNTER — Inpatient Hospital Stay

## 2024-09-24 ENCOUNTER — Encounter: Payer: Self-pay | Admitting: Hematology and Oncology

## 2024-09-24 ENCOUNTER — Other Ambulatory Visit (HOSPITAL_COMMUNITY): Payer: Self-pay

## 2024-09-24 ENCOUNTER — Telehealth: Payer: Self-pay

## 2024-09-24 NOTE — Telephone Encounter (Addendum)
 Oral Oncology Patient Advocate Encounter   Received notification that prior authorization for Verzenio  is due for renewal.  *09-29-24 Update rx has  been sent to  [facility]   *09-30-24 Update, Office Visit notes faxed in  and verbal verification of Diagnosis code given   PA submitted on 09/24/2024 Key [id number] Status is pending      Charlott Hamilton,  CPhT-Adv  she/her/hers [facility]  [facility] Pharmacy Technician Patient Advocate Specialist III [facility] Phone: [phone]  Fax: [phone] [email or web address]

## 2024-09-25 ENCOUNTER — Inpatient Hospital Stay

## 2024-09-25 VITALS — BP 107/79 | HR 87 | Temp 97.1°F | Resp 18

## 2024-09-25 DIAGNOSIS — Z5111 Encounter for antineoplastic chemotherapy: Secondary | ICD-10-CM | POA: Diagnosis not present

## 2024-09-25 DIAGNOSIS — C50511 Malignant neoplasm of lower-outer quadrant of right female breast: Secondary | ICD-10-CM

## 2024-09-25 MED ORDER — GOSERELIN ACETATE 3.6 MG ~~LOC~~ IMPL
3.6000 mg | DRUG_IMPLANT | Freq: Once | SUBCUTANEOUS | Status: AC
  Administered 2024-09-25: 3.6 mg via SUBCUTANEOUS
  Filled 2024-09-25: qty 3.6

## 2024-09-25 NOTE — Patient Instructions (Signed)
 [facility] [place] - A DEPT OF [place]  Discharge Instructions: Thank you for choosing [facility] to provide your oncology and hematology care.  If you have a lab appointment with the Cancer Center - please note that after Sinaya 8th, 2024, all labs will be drawn in the cancer center.  You do not have to check in or register with the main entrance as you have in the past but will complete your check-in in the cancer center.  Wear comfortable clothing and clothing appropriate for easy access to any Portacath or PICC line.   We strive to give you quality time with your provider. You may need to reschedule your appointment if you arrive late (15 or more minutes).  Arriving late affects you and other patients whose appointments are after yours.  Also, if you miss three or more appointments without notifying the office, you may be dismissed from the clinic at the providers discretion.      For prescription refill requests, have your pharmacy contact our office and allow 72 hours for refills to be completed.    Today you received the following Zoladex , return as scheduled.   To help prevent nausea and vomiting after your treatment, we encourage you to take your nausea medication as directed.  BELOW ARE SYMPTOMS THAT SHOULD BE REPORTED IMMEDIATELY: *FEVER GREATER THAN 100.4 F (38 C) OR HIGHER *CHILLS OR SWEATING *NAUSEA AND VOMITING THAT IS NOT CONTROLLED WITH YOUR NAUSEA MEDICATION *UNUSUAL SHORTNESS OF BREATH *UNUSUAL BRUISING OR BLEEDING *URINARY PROBLEMS (pain or burning when urinating, or frequent urination) *BOWEL PROBLEMS (unusual diarrhea, constipation, pain near the anus) TENDERNESS IN MOUTH AND THROAT WITH OR WITHOUT PRESENCE OF ULCERS (sore throat, sores in mouth, or a toothache) UNUSUAL RASH, SWELLING OR PAIN  UNUSUAL VAGINAL DISCHARGE OR ITCHING   Items with * indicate a potential emergency and should be followed up as soon as possible or  go to the Emergency Department if any problems should occur.  Please show the CHEMOTHERAPY ALERT CARD or IMMUNOTHERAPY ALERT CARD at check-in to the Emergency Department and triage nurse.  Should you have questions after your visit or need to cancel or reschedule your appointment, please contact [facility] [place] - A DEPT OF [facility] [phone]  and follow the prompts.  Office hours are 8:00 a.m. to 4:30 p.m. Monday - Friday. Please note that voicemails left after 4:00 p.m. may not be returned until the following business day.  We are closed weekends and major holidays. You have access to a nurse at all times for urgent questions. Please call the main number to the clinic [phone] and follow the prompts.  For any non-urgent questions, you may also contact your provider using MyChart. We now offer e-Visits for anyone 48 and older to request care online for non-urgent symptoms. For details visit [email or web address].   Also download the MyChart app! Go to the app store, search MyChart, open the app, select [place], and log in with your MyChart username and password.

## 2024-09-25 NOTE — Progress Notes (Signed)
 Patient tolerated injection with no complaints voiced. Site clean and dry with no bruising or swelling noted at site. See MAR for details. Band aid applied.  Patient stable during and after injection. VSS with discharge and left in satisfactory condition with no s/s of distress noted.

## 2024-09-28 ENCOUNTER — Other Ambulatory Visit (HOSPITAL_COMMUNITY): Payer: Self-pay

## 2024-09-29 ENCOUNTER — Other Ambulatory Visit: Payer: Self-pay

## 2024-09-29 ENCOUNTER — Other Ambulatory Visit (HOSPITAL_COMMUNITY): Payer: Self-pay

## 2024-09-29 ENCOUNTER — Encounter: Payer: Self-pay | Admitting: Hematology and Oncology

## 2024-09-29 MED ORDER — ABEMACICLIB 100 MG PO TABS: 100.0000 mg | ORAL_TABLET | Freq: Two times a day (BID) | ORAL | 0 refills | Status: AC

## 2024-09-30 ENCOUNTER — Other Ambulatory Visit (HOSPITAL_COMMUNITY): Payer: Self-pay

## 2024-10-01 ENCOUNTER — Other Ambulatory Visit (HOSPITAL_COMMUNITY): Payer: Self-pay

## 2024-10-01 NOTE — Progress Notes (Unsigned)
 [facility]        Telephone: [phone]?Fax: [phone]   Oncology Clinical Pharmacist Practitioner Progress Note   Erica Higgins was contacted via in-person to discuss her chemotherapy regimen for abemaciclib  which they receive under the care of Dr. Vinay Gudena.  Current treatment regimen and start date Abemaciclib  (07/03/24) Anastrozole  (07/21/24) Goserelin (07/29/24)  Interval History She continues on abemaciclib  100 mg by mouth every 12 hours on days 1 to 28 of a 28-day cycle. This is being given in combination with anastrozole  and goserelin. Therapy is planned to continue until disease progression or unacceptable toxicity. She last saw Dr. Gudena on 09/09/24 and clinical pharmacy on 08/11/25.  Response to Therapy *** Labs, vitals, treatment parameters, and manufacturer guidelines assessing toxicity were reviewed with Erica Higgins today. Based on these values, patient is in agreement to continue abemaciclib  therapy at this time.  Allergies Allergies  Allergen Reactions   Cleocin  [Clindamycin  Hcl] Hives   Ivp Dye [Iodinated Contrast Media] Anaphylaxis   Latex Hives   Penicillins Hives   Shellfish Allergy Anaphylaxis   Adhesive [Tape]     Rash, itch     Vitals    09/25/2024    8:41 AM 09/25/2024    8:30 AM 09/09/2024    9:11 AM  Oncology Vitals  Weight   118.434 kg  Weight (lbs)   261 lbs 2 oz  BMI   46.25 kg/m2  Temp  97.1 F (36.2 C) 97.7 F (36.5 C)  Pulse Rate  87 68  BP 107/79 158/74 135/70  Resp  18 17  SpO2  100 % 98 %  BSA (m2)   2.29 m2    Laboratory Data    Latest Ref Rng & Units 09/09/2024    8:43 AM 08/11/2024    7:47 AM 07/29/2024    2:54 PM  CBC EXTENDED  WBC 4.0 - 10.5 K/uL 7.5  6.1  6.2   RBC 3.87 - 5.11 MIL/uL 4.35  4.25  4.19   Hemoglobin 12.0 - 15.0 g/dL 86.3  87.1  87.3   HCT 36.0 - 46.0 % 39.0  39.2  37.9   Platelets 150 - 400 K/uL 290  270  253   NEUT# 1.7 - 7.7 K/uL 4.6  3.7  3.4   Lymph# 0.7 - 4.0 K/uL 2.1  1.8  2.2         Latest Ref Rng & Units 09/09/2024    8:43 AM 08/11/2024    7:47 AM 07/29/2024    2:54 PM  CMP  Glucose 70 - 99 mg/dL 861  815  845   BUN 6 - 20 mg/dL 12  12  10    Creatinine 0.44 - 1.00 mg/dL 9.01  8.89  8.98   Sodium 135 - 145 mmol/L 141  141  142   Potassium 3.5 - 5.1 mmol/L 4.2  4.2  3.7   Chloride 98 - 111 mmol/L 106  107  107   CO2 22 - 32 mmol/L 26  24  25    Calcium 8.9 - 10.3 mg/dL 9.1  9.1  9.2   Total Protein 6.5 - 8.1 g/dL 7.3  7.0  7.0   Total Bilirubin 0.0 - 1.2 mg/dL 0.3  0.3  0.2   Alkaline Phos 38 - 126 U/L 72  68  75   AST 15 - 41 U/L 56  39  30   ALT 0 - 44 U/L 67  34  23  Adverse Effects Assessment AST ALT  Adherence Assessment Erica Higgins reports missing *** doses over the past *** weeks.   Reason for missed dose: N/A Patient was re-educated on importance of adherence.   Access Assessment Erica Higgins is currently receiving her abemaciclib  through [facility]  Insurance concerns:  none  Medication Reconciliation The patient's medication list was reviewed today with the patient? Yes New medications or herbal supplements have recently been started? No  Any medications have been discontinued? No  The medication list was updated and reconciled based on the patient's most recent medication list in the electronic medical record (EMR) including herbal products and OTC medications.   Medications Current Outpatient Medications  Medication Sig Dispense Refill   abemaciclib  (VERZENIO ) 100 MG tablet Take 1 tablet (100 mg total) by mouth 2 (two) times daily. 168 tablet 0   Albuterol-Budesonide (AIRSUPRA ) 90-80 MCG/ACT AERO Inhale 2 puffs into the lungs every 6 (six) hours as needed. 10.7 g 5   anastrozole  (ARIMIDEX ) 1 MG tablet Take 1 tablet (1 mg total) by mouth daily. 90 tablet 3   Aspirin 81 MG CAPS Take 1 tablet by mouth daily.     B Complex Vitamins (B COMPLEX PO) Take by mouth.     chlorpheniramine (CHLOR-TRIMETON) 4 MG tablet Take 4 mg  by mouth 2 (two) times daily as needed for allergies.     cyclobenzaprine (FLEXERIL) 5 MG tablet Take 5 mg by mouth 3 (three) times daily.     DHA-EPA-Vitamin E (OMEGA-3 COMPLEX PO) Take 1 capsule by mouth in the morning and at bedtime.     fexofenadine (ALLEGRA) 180 MG tablet Take 180 mg by mouth daily.     fluticasone (FLONASE) 50 MCG/ACT nasal spray      Magnesium  250 MG TABS Take 1 tablet by mouth daily.     melatonin 5 MG TABS Take 5 mg by mouth at bedtime.     oxybutynin (DITROPAN) 5 MG tablet Take by mouth.     topiramate  (TOPAMAX ) 50 MG tablet Take 1 tablet (50 mg total) by mouth at bedtime. 30 tablet 5   Ubrogepant  (UBRELVY ) 100 MG TABS Take 1 tablet (100 mg total) by mouth as needed. May repeat after 2 hours.  Maximum 2 tablets in 24 hours. 16 tablet 5   No current facility-administered medications for this visit.   Drug-Drug Interactions (DDIs) DDIs were evaluated? Yes Significant DDIs? No  The patient was instructed to speak with their health care provider and/or the oral chemotherapy pharmacist before starting any new drug, including prescription or over the counter, natural / herbal products, or vitamins.  Supportive Care Diarrhea: we reviewed that diarrhea is common with abemaciclib  and confirmed that she does have loperamide (Imodium) at home.  We reviewed how to take this medication PRN. ILD/Pneumonitis: we reviewed potential symptoms including cough, shortness, and fatigue.  VTE: reviewed signs of DVT such as leg swelling, redness, pain, or tenderness and signs of PE such as shortness of breath, rapid or irregular heartbeat, cough, chest pain, or lightheadedness. Drug interactions with grapefruit products  Dosing Assessment Hepatic adjustments needed? No  Renal adjustments needed? No  Toxicity adjustments needed? No  The current dosing regimen is appropriate to continue at this time.  Follow-Up Plan Continue abemaciclib  100 by mouth every 12 hours.  Continue  anastrozole  1 mg by mouth daily Continue goserelin 3.6 mg SubQ every 28 days. Started on 07/29/24, last given 09/25/24, scheduled at [facility] next for 10/23/24 Monitor for side effects: AST,  ALT, *** Add labs prior to Dr. Odean visit on 11/18/24. Will also receive goserelin this day at [facility] Erica Higgins can follow up with clinical pharmacy as deemed necessary by Dr. Mackey Odean going forward   Adreona Gresham participated in the discussion, expressed understanding, and voiced agreement with the above plan. All questions were answered to her satisfaction. The patient was advised to contact the clinic at [phone] with any questions or concerns prior to her return visit.   I spent 30 minutes assessing and educating the patient.  Meghana Tullo A. Higgins, PharmD, BCOP, CPP  Erica Higgins, RPH-CPP, 10/01/2024  8:13 AM   **Disclaimer: This note was dictated with voice recognition software. Similar sounding words can inadvertently be transcribed and this note may contain transcription errors which may not have been corrected upon publication of note.**

## 2024-10-07 ENCOUNTER — Inpatient Hospital Stay: Attending: Hematology and Oncology

## 2024-10-07 ENCOUNTER — Inpatient Hospital Stay: Admitting: Pharmacist

## 2024-10-07 VITALS — BP 132/79 | HR 75 | Temp 97.7°F | Resp 17 | Wt 259.8 lb

## 2024-10-07 DIAGNOSIS — Z170 Estrogen receptor positive status [ER+]: Secondary | ICD-10-CM

## 2024-10-07 LAB — CBC WITH DIFFERENTIAL (CANCER CENTER ONLY)
Abs Immature Granulocytes: 0.01 10*3/uL (ref 0.00–0.07)
Basophils Absolute: 0.1 10*3/uL (ref 0.0–0.1)
Basophils Relative: 1 %
Eosinophils Absolute: 0.1 10*3/uL (ref 0.0–0.5)
Eosinophils Relative: 2 %
HCT: 39.2 % (ref 36.0–46.0)
Hemoglobin: 13.4 g/dL (ref 12.0–15.0)
Immature Granulocytes: 0 %
Lymphocytes Relative: 29 %
Lymphs Abs: 1.9 10*3/uL (ref 0.7–4.0)
MCH: 31.8 pg (ref 26.0–34.0)
MCHC: 34.2 g/dL (ref 30.0–36.0)
MCV: 92.9 fL (ref 80.0–100.0)
Monocytes Absolute: 0.5 10*3/uL (ref 0.1–1.0)
Monocytes Relative: 7 %
Neutro Abs: 4 10*3/uL (ref 1.7–7.7)
Neutrophils Relative %: 61 %
Platelet Count: 254 10*3/uL (ref 150–400)
RBC: 4.22 MIL/uL (ref 3.87–5.11)
RDW: 13 % (ref 11.5–15.5)
WBC Count: 6.6 10*3/uL (ref 4.0–10.5)
nRBC: 0 % (ref 0.0–0.2)

## 2024-10-07 LAB — CMP (CANCER CENTER ONLY)
ALT: 41 U/L (ref 0–44)
AST: 32 U/L (ref 15–41)
Albumin: 4.1 g/dL (ref 3.5–5.0)
Alkaline Phosphatase: 90 U/L (ref 38–126)
Anion gap: 12 (ref 5–15)
BUN: 13 mg/dL (ref 6–20)
CO2: 25 mmol/L (ref 22–32)
Calcium: 9.4 mg/dL (ref 8.9–10.3)
Chloride: 104 mmol/L (ref 98–111)
Creatinine: 1.09 mg/dL — ABNORMAL HIGH (ref 0.44–1.00)
GFR, Estimated: >60 mL/min
Glucose, Bld: 148 mg/dL — ABNORMAL HIGH (ref 70–99)
Potassium: 3.9 mmol/L (ref 3.5–5.1)
Sodium: 141 mmol/L (ref 135–145)
Total Bilirubin: 0.3 mg/dL (ref 0.0–1.2)
Total Protein: 7.5 g/dL (ref 6.5–8.1)

## 2024-10-07 MED ORDER — ANASTROZOLE 1 MG PO TABS: 1.0000 mg | ORAL_TABLET | Freq: Every day | ORAL | 3 refills | Status: AC

## 2024-10-08 ENCOUNTER — Other Ambulatory Visit: Payer: Self-pay | Admitting: Pharmacist

## 2024-10-08 ENCOUNTER — Encounter: Payer: Self-pay | Admitting: Pharmacist

## 2024-10-08 DIAGNOSIS — C50511 Malignant neoplasm of lower-outer quadrant of right female breast: Secondary | ICD-10-CM

## 2024-10-08 NOTE — Progress Notes (Signed)
 Erica Higgins contacted clinical pharmacy via MyChart regarding  a bruise on her right calf that she reported yesterday during our clinic visit. We discussed yesterday to monitor it closely and to call if it worsened. Yesterday, she felt the bruising was improving.  Today she feels the bruising is worse and now it is painful to walk. Because abemaciclib  may cause VTE in about 2% of patients, we have ordered a vascular ultrasound to be done as soon as possible out of an abundance of cation. This was communicated to the patient via MyChart. Dr. Gudena and his nursing team have been notified.  We discussed that someone from scheduling will contact her to get it scheduled.  Brynn Reznik A. Lucila, PharmD, BCOP, CPP 10/08/2024 8:23 PM

## 2024-10-09 ENCOUNTER — Other Ambulatory Visit: Payer: Self-pay | Admitting: *Deleted

## 2024-10-09 DIAGNOSIS — C50511 Malignant neoplasm of lower-outer quadrant of right female breast: Secondary | ICD-10-CM

## 2024-10-09 DIAGNOSIS — R2241 Localized swelling, mass and lump, right lower limb: Secondary | ICD-10-CM

## 2024-10-09 NOTE — Progress Notes (Signed)
 Pt requesting Vas US  of right lower extremity be ordered for [place].  Orders updated, pt notified to contact central scheduling.

## 2024-10-13 ENCOUNTER — Ambulatory Visit (HOSPITAL_COMMUNITY): Admission: RE | Admit: 2024-10-13 | Source: Ambulatory Visit

## 2024-10-21 ENCOUNTER — Inpatient Hospital Stay

## 2024-10-23 ENCOUNTER — Inpatient Hospital Stay

## 2024-11-04 ENCOUNTER — Ambulatory Visit (HOSPITAL_COMMUNITY)

## 2024-11-06 ENCOUNTER — Ambulatory Visit: Admitting: Neurology

## 2024-11-10 ENCOUNTER — Ambulatory Visit: Admitting: Neurology

## 2024-11-18 ENCOUNTER — Inpatient Hospital Stay

## 2024-11-18 ENCOUNTER — Inpatient Hospital Stay: Attending: Hematology and Oncology | Admitting: Hematology and Oncology

## 2024-12-16 ENCOUNTER — Inpatient Hospital Stay

## 2024-12-18 ENCOUNTER — Inpatient Hospital Stay: Attending: Hematology and Oncology

## 2025-01-13 ENCOUNTER — Inpatient Hospital Stay

## 2025-01-15 ENCOUNTER — Inpatient Hospital Stay: Attending: Hematology and Oncology

## 2025-02-10 ENCOUNTER — Inpatient Hospital Stay

## 2025-02-12 ENCOUNTER — Inpatient Hospital Stay: Attending: Hematology and Oncology

## 2025-03-10 ENCOUNTER — Inpatient Hospital Stay

## 2025-03-12 ENCOUNTER — Inpatient Hospital Stay: Attending: Hematology and Oncology

## 2025-04-07 ENCOUNTER — Inpatient Hospital Stay

## 2025-04-09 ENCOUNTER — Inpatient Hospital Stay: Attending: Hematology and Oncology

## 2025-05-05 ENCOUNTER — Inpatient Hospital Stay

## 2025-05-07 ENCOUNTER — Inpatient Hospital Stay: Attending: Hematology and Oncology

## 2025-06-02 ENCOUNTER — Inpatient Hospital Stay

## 2025-06-04 ENCOUNTER — Inpatient Hospital Stay: Attending: Hematology and Oncology
